# Patient Record
Sex: Female | Born: 1945
Health system: Southern US, Community
[De-identification: ages and names within clinical notes are randomized; demographics above are authoritative.]

## PROBLEM LIST (undated history)

## (undated) DIAGNOSIS — D500 Iron deficiency anemia secondary to blood loss (chronic): Principal | ICD-10-CM

## (undated) DIAGNOSIS — E119 Type 2 diabetes mellitus without complications: Secondary | ICD-10-CM

## (undated) DIAGNOSIS — E7800 Pure hypercholesterolemia, unspecified: Secondary | ICD-10-CM

## (undated) DIAGNOSIS — I10 Essential (primary) hypertension: Secondary | ICD-10-CM

## (undated) HISTORY — DX: Pure hypercholesterolemia, unspecified: E78.00

## (undated) HISTORY — PX: TUBAL LIGATION: SHX77

## (undated) HISTORY — DX: Iron deficiency anemia secondary to blood loss (chronic): D50.0

## (undated) HISTORY — PX: BUNIONECTOMY: SHX129

---

## 1998-09-19 ENCOUNTER — Emergency Department (HOSPITAL_COMMUNITY): Admission: EM | Admit: 1998-09-19 | Discharge: 1998-09-19 | Payer: Self-pay | Admitting: Emergency Medicine

## 1998-10-20 ENCOUNTER — Other Ambulatory Visit: Admission: RE | Admit: 1998-10-20 | Discharge: 1998-10-20 | Payer: Self-pay | Admitting: Obstetrics and Gynecology

## 1999-11-12 ENCOUNTER — Other Ambulatory Visit: Admission: RE | Admit: 1999-11-12 | Discharge: 1999-11-12 | Payer: Self-pay | Admitting: Obstetrics and Gynecology

## 2001-01-30 ENCOUNTER — Other Ambulatory Visit: Admission: RE | Admit: 2001-01-30 | Discharge: 2001-01-30 | Payer: Self-pay | Admitting: Obstetrics and Gynecology

## 2002-02-28 ENCOUNTER — Other Ambulatory Visit: Admission: RE | Admit: 2002-02-28 | Discharge: 2002-02-28 | Payer: Self-pay | Admitting: Obstetrics and Gynecology

## 2003-04-16 ENCOUNTER — Other Ambulatory Visit: Admission: RE | Admit: 2003-04-16 | Discharge: 2003-04-16 | Payer: Self-pay | Admitting: Obstetrics and Gynecology

## 2004-03-16 ENCOUNTER — Ambulatory Visit (HOSPITAL_COMMUNITY): Admission: RE | Admit: 2004-03-16 | Discharge: 2004-03-16 | Payer: Self-pay | Admitting: Pulmonary Disease

## 2004-06-08 ENCOUNTER — Other Ambulatory Visit: Admission: RE | Admit: 2004-06-08 | Discharge: 2004-06-08 | Payer: Self-pay | Admitting: Obstetrics and Gynecology

## 2005-10-07 ENCOUNTER — Other Ambulatory Visit: Admission: RE | Admit: 2005-10-07 | Discharge: 2005-10-07 | Payer: Self-pay | Admitting: Obstetrics and Gynecology

## 2005-11-04 ENCOUNTER — Ambulatory Visit (HOSPITAL_COMMUNITY): Admission: RE | Admit: 2005-11-04 | Discharge: 2005-11-04 | Payer: Self-pay | Admitting: Pulmonary Disease

## 2006-02-05 ENCOUNTER — Ambulatory Visit (HOSPITAL_COMMUNITY): Admission: RE | Admit: 2006-02-05 | Discharge: 2006-02-05 | Payer: Self-pay | Admitting: Pulmonary Disease

## 2006-03-10 ENCOUNTER — Ambulatory Visit (HOSPITAL_COMMUNITY): Admission: RE | Admit: 2006-03-10 | Discharge: 2006-03-10 | Payer: Self-pay | Admitting: Neurosurgery

## 2010-04-01 ENCOUNTER — Ambulatory Visit (HOSPITAL_COMMUNITY)
Admission: RE | Admit: 2010-04-01 | Discharge: 2010-04-01 | Payer: Self-pay | Source: Home / Self Care | Attending: Pulmonary Disease | Admitting: Pulmonary Disease

## 2010-04-10 ENCOUNTER — Encounter: Payer: Self-pay | Admitting: Pulmonary Disease

## 2011-08-24 ENCOUNTER — Ambulatory Visit (HOSPITAL_COMMUNITY)
Admission: RE | Admit: 2011-08-24 | Discharge: 2011-08-24 | Disposition: A | Discharge status: Home / Self Care | Payer: BC Managed Care – PPO | Source: Ambulatory Visit | Attending: Pulmonary Disease | Admitting: Pulmonary Disease

## 2011-08-24 ENCOUNTER — Other Ambulatory Visit (HOSPITAL_COMMUNITY): Payer: Self-pay | Admitting: Pulmonary Disease

## 2011-08-24 DIAGNOSIS — M2550 Pain in unspecified joint: Secondary | ICD-10-CM

## 2011-08-24 DIAGNOSIS — M25519 Pain in unspecified shoulder: Secondary | ICD-10-CM | POA: Insufficient documentation

## 2012-10-01 ENCOUNTER — Other Ambulatory Visit (HOSPITAL_COMMUNITY): Payer: Self-pay | Admitting: Pulmonary Disease

## 2012-10-01 DIAGNOSIS — R1013 Epigastric pain: Secondary | ICD-10-CM

## 2012-10-04 ENCOUNTER — Ambulatory Visit (HOSPITAL_COMMUNITY)
Admission: RE | Admit: 2012-10-04 | Discharge: 2012-10-04 | Disposition: A | Discharge status: Home / Self Care | Payer: Medicare Other | Source: Ambulatory Visit | Attending: Pulmonary Disease | Admitting: Pulmonary Disease

## 2012-10-04 ENCOUNTER — Other Ambulatory Visit (HOSPITAL_COMMUNITY): Payer: Self-pay | Admitting: Pulmonary Disease

## 2012-10-04 DIAGNOSIS — K219 Gastro-esophageal reflux disease without esophagitis: Secondary | ICD-10-CM | POA: Insufficient documentation

## 2012-10-04 DIAGNOSIS — R1013 Epigastric pain: Secondary | ICD-10-CM | POA: Insufficient documentation

## 2012-10-23 ENCOUNTER — Other Ambulatory Visit (HOSPITAL_COMMUNITY): Payer: Self-pay | Admitting: Pulmonary Disease

## 2012-10-23 DIAGNOSIS — R109 Unspecified abdominal pain: Secondary | ICD-10-CM

## 2012-10-25 ENCOUNTER — Ambulatory Visit (HOSPITAL_COMMUNITY)
Admission: RE | Admit: 2012-10-25 | Discharge: 2012-10-25 | Disposition: A | Discharge status: Home / Self Care | Payer: Medicare Other | Source: Ambulatory Visit | Attending: Pulmonary Disease | Admitting: Pulmonary Disease

## 2012-10-25 DIAGNOSIS — R109 Unspecified abdominal pain: Secondary | ICD-10-CM

## 2014-07-03 ENCOUNTER — Other Ambulatory Visit (HOSPITAL_COMMUNITY): Payer: Self-pay | Admitting: Pulmonary Disease

## 2014-07-03 DIAGNOSIS — R1011 Right upper quadrant pain: Secondary | ICD-10-CM

## 2014-07-16 ENCOUNTER — Ambulatory Visit (HOSPITAL_COMMUNITY): Payer: Medicare Other

## 2014-07-18 ENCOUNTER — Encounter (HOSPITAL_COMMUNITY): Payer: Self-pay

## 2014-07-18 ENCOUNTER — Ambulatory Visit (HOSPITAL_COMMUNITY)
Admission: RE | Admit: 2014-07-18 | Discharge: 2014-07-18 | Disposition: A | Discharge status: Home / Self Care | Payer: Medicare Other | Source: Ambulatory Visit | Attending: Pulmonary Disease | Admitting: Pulmonary Disease

## 2014-07-18 DIAGNOSIS — R1011 Right upper quadrant pain: Secondary | ICD-10-CM | POA: Diagnosis not present

## 2014-07-18 HISTORY — DX: Essential (primary) hypertension: I10

## 2014-07-18 MED ORDER — IOHEXOL 300 MG/ML  SOLN
100.0000 mL | Freq: Once | INTRAMUSCULAR | Status: AC | PRN
  Administered 2014-07-18: 100 mL via INTRAVENOUS

## 2014-09-23 ENCOUNTER — Other Ambulatory Visit: Payer: Self-pay | Admitting: Obstetrics and Gynecology

## 2014-09-23 DIAGNOSIS — R928 Other abnormal and inconclusive findings on diagnostic imaging of breast: Secondary | ICD-10-CM

## 2014-10-09 ENCOUNTER — Ambulatory Visit
Admission: RE | Admit: 2014-10-09 | Discharge: 2014-10-09 | Disposition: A | Discharge status: Home / Self Care | Payer: Medicare Other | Source: Ambulatory Visit | Attending: Obstetrics and Gynecology | Admitting: Obstetrics and Gynecology

## 2014-10-09 DIAGNOSIS — R928 Other abnormal and inconclusive findings on diagnostic imaging of breast: Secondary | ICD-10-CM

## 2015-01-01 ENCOUNTER — Other Ambulatory Visit (HOSPITAL_COMMUNITY): Payer: Self-pay | Admitting: Pulmonary Disease

## 2015-01-01 DIAGNOSIS — R6889 Other general symptoms and signs: Secondary | ICD-10-CM

## 2015-01-01 DIAGNOSIS — R209 Unspecified disturbances of skin sensation: Secondary | ICD-10-CM

## 2015-01-06 ENCOUNTER — Ambulatory Visit (HOSPITAL_COMMUNITY): Payer: Medicare Other

## 2015-01-08 ENCOUNTER — Ambulatory Visit (HOSPITAL_COMMUNITY)
Admission: RE | Admit: 2015-01-08 | Discharge: 2015-01-08 | Disposition: A | Discharge status: Home / Self Care | Payer: Medicare Other | Source: Ambulatory Visit | Attending: Pulmonary Disease | Admitting: Pulmonary Disease

## 2015-01-08 DIAGNOSIS — R209 Unspecified disturbances of skin sensation: Secondary | ICD-10-CM

## 2015-01-08 DIAGNOSIS — R6889 Other general symptoms and signs: Secondary | ICD-10-CM | POA: Diagnosis not present

## 2015-01-08 NOTE — Progress Notes (Signed)
VASCULAR LAB PRELIMINARY  PRELIMINARY  PRELIMINARY  PRELIMINARY  VASCULAR LAB PRELIMINARY  ARTERIAL  ABI completed: Normal ABI at rest.     RIGHT    LEFT    PRESSURE WAVEFORM  PRESSURE WAVEFORM  BRACHIAL 133 Normal BRACHIAL 134 Normal  DP 136 Triphasic DP 134 Triphasic  PT 133 Triphasic PT 126 Biphasic  GREAT TOE  NA GREAT TOE  NA    RIGHT LEFT  ABI 1.0 1.0     Rachael Davenport, Bonnye Fava, RVT 01/08/2015, 10:44 AM    Rachael Davenport, Bonnye Fava, RVT 01/08/2015, 10:43 AM

## 2015-03-20 ENCOUNTER — Other Ambulatory Visit: Payer: Self-pay | Admitting: Obstetrics and Gynecology

## 2015-03-20 DIAGNOSIS — R921 Mammographic calcification found on diagnostic imaging of breast: Secondary | ICD-10-CM

## 2015-04-14 ENCOUNTER — Ambulatory Visit
Admission: RE | Admit: 2015-04-14 | Discharge: 2015-04-14 | Disposition: A | Discharge status: Home / Self Care | Payer: Medicare Other | Source: Ambulatory Visit | Attending: Obstetrics and Gynecology | Admitting: Obstetrics and Gynecology

## 2015-04-14 DIAGNOSIS — R921 Mammographic calcification found on diagnostic imaging of breast: Secondary | ICD-10-CM

## 2015-10-05 ENCOUNTER — Other Ambulatory Visit: Payer: Self-pay | Admitting: Obstetrics and Gynecology

## 2015-10-05 DIAGNOSIS — R921 Mammographic calcification found on diagnostic imaging of breast: Secondary | ICD-10-CM

## 2015-10-08 ENCOUNTER — Other Ambulatory Visit: Payer: Self-pay | Admitting: Obstetrics and Gynecology

## 2015-10-08 DIAGNOSIS — M810 Age-related osteoporosis without current pathological fracture: Secondary | ICD-10-CM

## 2015-10-09 ENCOUNTER — Ambulatory Visit
Admission: RE | Admit: 2015-10-09 | Discharge: 2015-10-09 | Disposition: A | Discharge status: Home / Self Care | Payer: Medicare Other | Source: Ambulatory Visit | Attending: Obstetrics and Gynecology | Admitting: Obstetrics and Gynecology

## 2015-10-09 DIAGNOSIS — R921 Mammographic calcification found on diagnostic imaging of breast: Secondary | ICD-10-CM

## 2015-10-16 ENCOUNTER — Ambulatory Visit
Admission: RE | Admit: 2015-10-16 | Discharge: 2015-10-16 | Disposition: A | Discharge status: Home / Self Care | Payer: Medicare Other | Source: Ambulatory Visit | Attending: Obstetrics and Gynecology | Admitting: Obstetrics and Gynecology

## 2015-10-16 DIAGNOSIS — M810 Age-related osteoporosis without current pathological fracture: Secondary | ICD-10-CM

## 2016-06-23 ENCOUNTER — Ambulatory Visit (HOSPITAL_COMMUNITY)
Admission: RE | Admit: 2016-06-23 | Discharge: 2016-06-23 | Disposition: A | Discharge status: Home / Self Care | Payer: Medicare Other | Source: Ambulatory Visit | Attending: Vascular Surgery | Admitting: Vascular Surgery

## 2016-06-23 ENCOUNTER — Other Ambulatory Visit (HOSPITAL_COMMUNITY): Payer: Self-pay | Admitting: Pulmonary Disease

## 2016-06-23 DIAGNOSIS — R52 Pain, unspecified: Secondary | ICD-10-CM | POA: Diagnosis not present

## 2016-06-23 DIAGNOSIS — E785 Hyperlipidemia, unspecified: Secondary | ICD-10-CM | POA: Diagnosis not present

## 2016-06-23 DIAGNOSIS — M79604 Pain in right leg: Secondary | ICD-10-CM | POA: Insufficient documentation

## 2016-06-23 DIAGNOSIS — I10 Essential (primary) hypertension: Secondary | ICD-10-CM | POA: Diagnosis not present

## 2016-06-23 DIAGNOSIS — M79605 Pain in left leg: Secondary | ICD-10-CM | POA: Insufficient documentation

## 2016-08-06 ENCOUNTER — Encounter (HOSPITAL_COMMUNITY): Payer: Self-pay | Admitting: Emergency Medicine

## 2016-08-06 ENCOUNTER — Emergency Department (HOSPITAL_COMMUNITY)
Admission: EM | Admit: 2016-08-06 | Discharge: 2016-08-06 | Disposition: A | Discharge status: Home / Self Care | Payer: Medicare Other | Attending: Emergency Medicine | Admitting: Emergency Medicine

## 2016-08-06 DIAGNOSIS — I10 Essential (primary) hypertension: Secondary | ICD-10-CM | POA: Diagnosis not present

## 2016-08-06 DIAGNOSIS — M7989 Other specified soft tissue disorders: Secondary | ICD-10-CM | POA: Insufficient documentation

## 2016-08-06 DIAGNOSIS — E119 Type 2 diabetes mellitus without complications: Secondary | ICD-10-CM | POA: Diagnosis not present

## 2016-08-06 DIAGNOSIS — Z7982 Long term (current) use of aspirin: Secondary | ICD-10-CM | POA: Diagnosis not present

## 2016-08-06 DIAGNOSIS — M79661 Pain in right lower leg: Secondary | ICD-10-CM

## 2016-08-06 DIAGNOSIS — Z79899 Other long term (current) drug therapy: Secondary | ICD-10-CM | POA: Insufficient documentation

## 2016-08-06 DIAGNOSIS — M79671 Pain in right foot: Secondary | ICD-10-CM | POA: Diagnosis present

## 2016-08-06 HISTORY — DX: Type 2 diabetes mellitus without complications: E11.9

## 2016-08-06 LAB — I-STAT CHEM 8, ED
BUN: 13 mg/dL (ref 6–20)
CALCIUM ION: 1.15 mmol/L (ref 1.15–1.40)
CREATININE: 0.9 mg/dL (ref 0.44–1.00)
Chloride: 107 mmol/L (ref 101–111)
Glucose, Bld: 95 mg/dL (ref 65–99)
HEMATOCRIT: 29 % — AB (ref 36.0–46.0)
Hemoglobin: 9.9 g/dL — ABNORMAL LOW (ref 12.0–15.0)
Potassium: 4.5 mmol/L (ref 3.5–5.1)
Sodium: 139 mmol/L (ref 135–145)
TCO2: 24 mmol/L (ref 0–100)

## 2016-08-06 MED ORDER — ENOXAPARIN SODIUM 60 MG/0.6ML ~~LOC~~ SOLN
1.0000 mg/kg | Freq: Once | SUBCUTANEOUS | Status: AC
  Administered 2016-08-06: 55 mg via SUBCUTANEOUS
  Filled 2016-08-06: qty 0.6

## 2016-08-06 NOTE — ED Triage Notes (Signed)
Swollen rt foot and ankle  since Monday, states had [place] on her legs in  April to check for clots and it was clear. Denies any injury, foot has good pulse and neuro, can wiggle toes

## 2016-08-06 NOTE — ED Provider Notes (Signed)
[place] DEPT Provider Note   CSN: [id number] Arrival date & time: 08/06/16  1809  By signing my name below, I, Sonum Patel, attest that this documentation has been prepared under the direction and in the presence of Debroah Baller, NP. Electronically Signed: Ludger Nutting, Scribe. 08/06/16. 7:45 PM.  History   Chief Complaint Chief Complaint  Patient presents with  . Foot Pain    The history is provided by the patient. No language interpreter was used.     HPI Comments: Rachael Davenport is a 71 y.o. female who presents to the Emergency Department complaining of persistent right foot and ankle pain that has been ongoing for the past week. She denies known injury, trauma, or insect bites recently. She reports associated mild pain to the same area. She notes having an US of the area in April 2018 and was told the study was negative. She denies SOB, cough, wheezing, CP, nausea, vomiting, fever, chills.   Her medication list found via Care Everywhere: Crestor 10mg , Imitrex 25 mg tablet, vitamin D2 50,000 units capsule q week, Besibance 0.6% susp, Prolenesa 0.07% solution, Durezol 0.05% emul to left eye. Will have pharmacy update patient's chart.  Past Medical History:  Diagnosis Date  . Diabetes mellitus without complication ([place])   . Hypertension     There are no active problems to display for this patient.   History reviewed. No pertinent surgical history.  OB History    No data available       Home Medications    Prior to Admission medications   Medication Sig Start Date End Date Taking? Authorizing Provider  aspirin EC 81 MG tablet Take 81 mg by mouth daily.   Yes [provider]  cycloSPORINE (RESTASIS) 0.05 % ophthalmic emulsion Place 1 drop into both eyes 2 (two) times daily.   Yes [provider]  olmesartan (BENICAR) 20 MG tablet Take 20 mg by mouth daily. 05/12/15  Yes [provider]  SUMAtriptan (IMITREX) 25 MG tablet Take 25 mg by mouth  once as needed for migraine (MAY REPEAT ONCE IN 2 HOURS IF NO RELIEF (MAX 2 TABLETS/24 HOURS)).  06/08/15  Yes [provider]  Vitamin D, Ergocalciferol, (DRISDOL) 50000 units CAPS capsule Take 50,000 Units by mouth every 7 (seven) days. 05/12/15  Yes [provider]    Family History No family history on file.  Social History Social History  Substance Use Topics  . Smoking status: Never Smoker  . Smokeless tobacco: Never Used  . Alcohol use Not on file     Allergies   Diovan [valsartan] and Penicillins   Review of Systems Review of Systems  Constitutional: Negative for chills and fever.  Respiratory: Negative for cough, shortness of breath and wheezing.   Cardiovascular: Positive for leg swelling. Negative for chest pain.  Gastrointestinal: Negative for nausea and vomiting.  Musculoskeletal: Positive for arthralgias and joint swelling.  Skin: Negative for rash.  Neurological: Negative for syncope.     Physical Exam Updated Vital Signs BP 140/69 (BP Location: Right Arm)   Pulse 73   Temp 98.4 F (36.9 C)   Resp 16   Wt 124 lb 5 oz (56.4 kg)   SpO2 100%   Physical Exam  Constitutional: She is oriented to person, place, and time. She appears well-developed and well-nourished.  Pulmonary/Chest: Effort normal.  Abdominal: Soft.  Musculoskeletal: Normal range of motion. She exhibits edema.  Swelling to the right foot and ankle. Pedal pulses are 2+, adequate circulation.  Plantar flexion and dorsiflexion without difficulty or pain.   Neurological: She is alert and oriented to person, place, and time.  Skin: Skin is warm and dry.  No increased warmth   Nursing note and vitals reviewed.    ED Treatments / Results  DIAGNOSTIC STUDIES: Oxygen Saturation is 100% on RA, normal by my interpretation.    COORDINATION OF CARE: 7:37 PM Discussed treatment plan with pt at bedside and pt agreed to plan.   Labs (all labs ordered are listed, but only abnormal  results are displayed) Labs Reviewed  I-STAT CHEM 8, ED - Abnormal; Notable for the following:       Result Value   Hemoglobin 9.9 (*)    HCT 29.0 (*)    All other components within normal limits   Radiology No results found.  Procedures Procedures (including critical care time)  Medications Ordered in ED Medications  enoxaparin (LOVENOX) injection 55 mg (55 mg Subcutaneous Given 08/06/16 2224)     Initial Impression / Assessment and Plan / ED Course  I have reviewed the triage vital signs and the nursing notes.  Pertinent lab results that were available during my care of the patient were reviewed by me and considered in my medical decision making (see chart for details).   Final Clinical Impressions(s) / ED Diagnoses  71 y.o. female with right lower extremity swelling stable for d/c to return in the morning for ultrasound study for possible DVT. Patient given Lovenox tonight.   Final diagnoses:  Foot pain, right  Pain and swelling of right lower leg    New Prescriptions Discharge Medication List as of 08/06/2016 10:11 PM     I personally performed the services described in this documentation, which was scribed in my presence. The recorded information has been reviewed and is accurate.    Debroah Baller [place], [place] 08/06/16 Arnoldo Lenis    Charlesetta Shanks, MD 08/08/16 [phone]

## 2016-08-06 NOTE — Discharge Instructions (Signed)
Return in the morning for the ultrasound to make sure you do not have a blood clot.

## 2016-08-07 ENCOUNTER — Ambulatory Visit (HOSPITAL_COMMUNITY)
Admission: RE | Admit: 2016-08-07 | Discharge: 2016-08-07 | Disposition: A | Discharge status: Home / Self Care | Payer: Medicare Other | Source: Ambulatory Visit | Attending: Emergency Medicine | Admitting: Emergency Medicine

## 2016-08-07 DIAGNOSIS — M79609 Pain in unspecified limb: Secondary | ICD-10-CM | POA: Diagnosis not present

## 2016-08-07 DIAGNOSIS — M7989 Other specified soft tissue disorders: Secondary | ICD-10-CM | POA: Diagnosis not present

## 2016-08-07 NOTE — Progress Notes (Signed)
VASCULAR LAB PRELIMINARY  PRELIMINARY  PRELIMINARY  PRELIMINARY  Right lower extremity venous duplex completed.    Preliminary report:  There is no DVT or SVT noted in the right lower extremity.   Zakaria Fromer, RVT 08/07/2016, 8:46 AM

## 2016-09-08 ENCOUNTER — Other Ambulatory Visit: Payer: Self-pay | Admitting: Obstetrics and Gynecology

## 2016-09-08 DIAGNOSIS — R921 Mammographic calcification found on diagnostic imaging of breast: Secondary | ICD-10-CM

## 2016-09-15 ENCOUNTER — Other Ambulatory Visit: Payer: Self-pay | Admitting: Family

## 2016-09-15 ENCOUNTER — Other Ambulatory Visit (HOSPITAL_BASED_OUTPATIENT_CLINIC_OR_DEPARTMENT_OTHER): Payer: Medicare Other

## 2016-09-15 ENCOUNTER — Ambulatory Visit (HOSPITAL_BASED_OUTPATIENT_CLINIC_OR_DEPARTMENT_OTHER): Payer: Medicare Other | Admitting: Family

## 2016-09-15 ENCOUNTER — Ambulatory Visit: Payer: Medicare Other

## 2016-09-15 VITALS — BP 117/56 | HR 65 | Temp 98.0°F | Resp 17 | Ht 62.0 in | Wt 123.0 lb

## 2016-09-15 DIAGNOSIS — D631 Anemia in chronic kidney disease: Secondary | ICD-10-CM

## 2016-09-15 DIAGNOSIS — N189 Chronic kidney disease, unspecified: Secondary | ICD-10-CM

## 2016-09-15 DIAGNOSIS — D510 Vitamin B12 deficiency anemia due to intrinsic factor deficiency: Secondary | ICD-10-CM

## 2016-09-15 DIAGNOSIS — D508 Other iron deficiency anemias: Secondary | ICD-10-CM

## 2016-09-15 DIAGNOSIS — D649 Anemia, unspecified: Secondary | ICD-10-CM

## 2016-09-15 LAB — COMPREHENSIVE METABOLIC PANEL
ALT: 13 U/L (ref 0–55)
ANION GAP: 7 meq/L (ref 3–11)
AST: 21 U/L (ref 5–34)
Albumin: 4.1 g/dL (ref 3.5–5.0)
Alkaline Phosphatase: 70 U/L (ref 40–150)
BUN: 25 mg/dL (ref 7.0–26.0)
CALCIUM: 9.9 mg/dL (ref 8.4–10.4)
CHLORIDE: 105 meq/L (ref 98–109)
CO2: 26 meq/L (ref 22–29)
CREATININE: 1 mg/dL (ref 0.6–1.1)
EGFR: 67 mL/min/{1.73_m2} — AB (ref >90)
Glucose: 94 mg/dl (ref 70–140)
POTASSIUM: 4.2 meq/L (ref 3.5–5.1)
Sodium: 139 mEq/L (ref 136–145)
Total Bilirubin: 0.84 mg/dL (ref 0.20–1.20)
Total Protein: 7.6 g/dL (ref 6.4–8.3)

## 2016-09-15 LAB — IRON AND TIBC
%SAT: 26 % (ref 21–57)
IRON: 71 ug/dL (ref 41–142)
TIBC: 273 ug/dL (ref 236–444)
UIBC: 202 ug/dL (ref 120–384)

## 2016-09-15 LAB — CBC WITH DIFFERENTIAL (CANCER CENTER ONLY)
BASO#: 0 10*3/uL (ref 0.0–0.2)
BASO%: 1.2 % (ref 0.0–2.0)
EOS%: 4.4 % (ref 0.0–7.0)
Eosinophils Absolute: 0.2 10*3/uL (ref 0.0–0.5)
HCT: 32.1 % — ABNORMAL LOW (ref 34.8–46.6)
HGB: 10.3 g/dL — ABNORMAL LOW (ref 11.6–15.9)
LYMPH#: 1.4 10*3/uL (ref 0.9–3.3)
LYMPH%: 39.7 % (ref 14.0–48.0)
MCH: 31.6 pg (ref 26.0–34.0)
MCHC: 32.1 g/dL (ref 32.0–36.0)
MCV: 99 fL (ref 81–101)
MONO#: 0.4 10*3/uL (ref 0.1–0.9)
MONO%: 10.5 % (ref 0.0–13.0)
NEUT#: 1.5 10*3/uL (ref 1.5–6.5)
NEUT%: 44.2 % (ref 39.6–80.0)
PLATELETS: 198 10*3/uL (ref 145–400)
RBC: 3.26 10*6/uL — ABNORMAL LOW (ref 3.70–5.32)
RDW: 12.3 % (ref 11.1–15.7)
WBC: 3.4 10*3/uL — ABNORMAL LOW (ref 3.9–10.0)

## 2016-09-15 LAB — FERRITIN: Ferritin: 246 ng/ml (ref 9–269)

## 2016-09-15 LAB — LACTATE DEHYDROGENASE: LDH: 207 U/L (ref 125–245)

## 2016-09-15 LAB — CHCC SATELLITE - SMEAR

## 2016-09-15 NOTE — Progress Notes (Signed)
Hematology/Oncology Consultation   Name: Rachael Davenport      MRN: [id number]    Location: Room/bed info not found  Date: 09/15/2016 Time:10:23 AM   REFERRING PHYSICIAN: Vincente Liberty, MD  REASON FOR CONSULT: Anemia    DIAGNOSIS: Erythropoietin deficiency anemia   HISTORY OF PRESENT ILLNESS: Rachael Davenport is a very pleasant 71 yo African American female with recent history of anemia. She has had some fatigue and occasional chills.  Hgb is a little improved today at 10.3 with an MCV of 99. Iron studies and epo level are pending.  No family history of anemia that she knows of. No sickle cell disease or trait.   No personal or familial history of cancer that she is aware of.  She has had no problem with frequent infections. No fever, chills, n/v, cough, rash, dizziness, SOB, chest pain, palpitations, abdominal pain or changes in bowel or bladder habits.  No episodes or bleeding, bruising or petechiae.  She states that she is up to date with her colonoscopy and that her last one was negative. Her mammogram is scheduled for 10/10/2016.  She has mild swelling in her right ankle that comes and goes. She states that she sees a vascular specialist. No tenderness in her extremities. She has numbness and tingling in her right hand that is constant. She denies having any back issues.  She has maintained a good appetite and is staying well hydrated. She states that her weight is stable.  She has never been a smoker and does not drink alcoholic beverages.  She takes one baby aspirin daily.  She has 3 grown children and no history of miscarriage.  Her only surgery has been cataracts removed from both eyes.   She retired from [employer] after 30+ years in 2013. 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   ROS: All other 10 point review of systems is negative.   PAST MEDICAL HISTORY:   Past  Medical History:  Diagnosis Date  . Diabetes mellitus without complication ([place])   . Hypertension     ALLERGIES: Allergies  Allergen Reactions  . Diovan [Valsartan] Other (See Comments)    Heart sped up  . Penicillins Itching and Swelling    Has patient had a PCN reaction causing immediate rash, facial/tongue/throat swelling, SOB or lightheadedness with hypotension: Yes Has patient had a PCN reaction causing severe rash involving mucus membranes or skin necrosis: No Has patient had a PCN reaction that required hospitalization: No Has patient had a PCN reaction occurring within the last 10 years: No If all of the above answers are "NO", then may proceed with Cephalosporin use.       MEDICATIONS:  Current Outpatient Prescriptions on File Prior to Visit  Medication Sig Dispense Refill  . aspirin EC 81 MG tablet Take 81 mg by mouth daily.    Marland Kitchen olmesartan (BENICAR) 20 MG tablet Take 20 mg by mouth daily.    . SUMAtriptan (IMITREX) 25 MG tablet Take 25 mg by mouth once as needed for migraine (MAY REPEAT ONCE IN 2 HOURS IF NO RELIEF (MAX 2 TABLETS/24 HOURS)).     . Vitamin D, Ergocalciferol, (DRISDOL) 50000 units CAPS capsule Take 50,000 Units by mouth every 7 (seven) days.     No current facility-administered medications on file prior to visit.      PAST SURGICAL HISTORY No past surgical history on file.  FAMILY  HISTORY: No family history on file.  SOCIAL HISTORY:  reports that she has never smoked. She has never used smokeless tobacco. Her alcohol and drug histories are not on file.  PERFORMANCE STATUS: The patient's performance status is 1 - Symptomatic but completely ambulatory  PHYSICAL EXAM: Most Recent Vital Signs: Blood pressure (!) 117/56, pulse 65, temperature 98 F (36.7 C), temperature source Oral, resp. rate 17, height 5\' 2"  (1.575 m), weight 123 lb (55.8 kg), SpO2 100 %. BP (!) 117/56 (BP Location: Right Arm, Patient Position: Sitting)   Pulse 65   Temp 98 F  (36.7 C) (Oral)   Resp 17   Ht 5\' 2"  (1.575 m)   Wt 123 lb (55.8 kg)   SpO2 100%   BMI 22.50 kg/m   General Appearance:    Alert, cooperative, no distress, appears stated age  Head:    Normocephalic, without obvious abnormality, atraumatic  Eyes:    PERRL, conjunctiva/corneas clear, EOM's intact, fundi    benign, both eyes        Throat:   Lips, mucosa, and tongue normal; teeth and gums normal  Neck:   Supple, symmetrical, trachea midline, no adenopathy;    thyroid:  no enlargement/tenderness/nodules; no carotid   bruit or JVD  Back:     Symmetric, no curvature, ROM normal, no CVA tenderness  Lungs:     Clear to auscultation bilaterally, respirations unlabored  Chest Wall:    No tenderness or deformity   Heart:    Regular rate and rhythm, S1 and S2 normal, no murmur, rub   or gallop     Abdomen:     Soft, non-tender, bowel sounds active all four quadrants,    no masses, no organomegaly        Extremities:   Extremities normal, atraumatic, no cyanosis or edema  Pulses:   2+ and symmetric all extremities  Skin:   Skin color, texture, turgor normal, no rashes or lesions  Lymph nodes:   Cervical, supraclavicular, and axillary nodes normal  Neurologic:   CNII-XII intact, normal strength, sensation and reflexes    throughout    LABORATORY DATA:  Results for orders placed or performed in visit on 09/15/16 (from the past 48 hour(s))  CBC w/Diff     Status: Abnormal   Collection Time: 09/15/16  9:50 AM  Result Value Ref Range   WBC 3.4 (L) 3.9 - 10.0 10e3/uL   RBC 3.26 (L) 3.70 - 5.32 10e6/uL   HGB 10.3 (L) 11.6 - 15.9 g/dL   HCT 32.1 (L) 34.8 - 46.6 %   MCV 99 81 - 101 fL   MCH 31.6 26.0 - 34.0 pg   MCHC 32.1 32.0 - 36.0 g/dL   RDW 12.3 11.1 - 15.7 %   Platelets 198 145 - 400 10e3/uL   NEUT# 1.5 1.5 - 6.5 10e3/uL   LYMPH# 1.4 0.9 - 3.3 10e3/uL   MONO# 0.4 0.1 - 0.9 10e3/uL   Eosinophils Absolute 0.2 0.0 - 0.5 10e3/uL   BASO# 0.0 0.0 - 0.2 10e3/uL   NEUT% 44.2 39.6 -  80.0 %   LYMPH% 39.7 14.0 - 48.0 %   MONO% 10.5 0.0 - 13.0 %   EOS% 4.4 0.0 - 7.0 %   BASO% 1.2 0.0 - 2.0 %  Smear     Status: None   Collection Time: 09/15/16  9:50 AM  Result Value Ref Range   Smear Result Smear Available       RADIOGRAPHY: No results found.  PATHOLOGY: None  ASSESSMENT/PLAN: Ms. Kirchman is a very pleasant 71 yo [place] American female with recent history of anemia. Her erythropoietin level is low. Iron studies and B 12 level look ok.  Hgb is 10.3 with an MCV of 99. We will look at treating her with an ESA, Aranesp as indicated.  We will continue to follow along with her and plan to see her back in another 2 months for repeat lab work and follow-up.  All questions for today were answered. She will contact our office with any questions or concerns. We can certainly see her much sooner if necessary.  She was discussed with and also seen by Dr. Marin Olp and he is in agreement with the aforementioned.   [facility]   Addendum:  I agree with the above assessment by Judson Roch. I saw and examined patient with Judson Roch. 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 Her erythropoietin level is only 8.5. She will never get her hemoglobin up to where it should be "normal" because of this.  Her reticulocyte count is less than 1.0. Again discussed to show how her bone marrow is just "weak" because of the low erythropoietin level.  She clearly needs Aranesp. We will go ahead and try to get this started. This will be able to help her get her hemoglobin higher. This will make her feel better.  I looked at her blood under the microscope. I do not see anything that looked suspicious.  Her iron studies look okay. Her ferritin is 246 with an iron saturation of 26%.  We spent about 40 minutes with her. We answered all of her questions. We showed her the labs and review the labs with her.  She felt much better after talking to [place]. We gave her a prayer blanket. She is very thankful  for this.  Lattie Haw, MD

## 2016-09-16 LAB — ERYTHROPOIETIN: ERYTHROPOIETIN: 8.5 m[IU]/mL (ref 2.6–18.5)

## 2016-09-16 LAB — VITAMIN B12: Vitamin B12: 370 pg/mL (ref 232–1245)

## 2016-09-16 LAB — RETICULOCYTES: RETICULOCYTE COUNT: 1 % (ref 0.6–2.6)

## 2016-09-18 DIAGNOSIS — D631 Anemia in chronic kidney disease: Secondary | ICD-10-CM | POA: Insufficient documentation

## 2016-10-14 ENCOUNTER — Ambulatory Visit
Admission: RE | Admit: 2016-10-14 | Discharge: 2016-10-14 | Disposition: A | Discharge status: Home / Self Care | Payer: Medicare Other | Source: Ambulatory Visit | Attending: Obstetrics and Gynecology | Admitting: Obstetrics and Gynecology

## 2016-10-14 DIAGNOSIS — R921 Mammographic calcification found on diagnostic imaging of breast: Secondary | ICD-10-CM

## 2016-12-23 ENCOUNTER — Ambulatory Visit (HOSPITAL_BASED_OUTPATIENT_CLINIC_OR_DEPARTMENT_OTHER): Payer: Medicare Other | Admitting: Hematology & Oncology

## 2016-12-23 ENCOUNTER — Other Ambulatory Visit (HOSPITAL_BASED_OUTPATIENT_CLINIC_OR_DEPARTMENT_OTHER): Payer: Medicare Other

## 2016-12-23 ENCOUNTER — Encounter: Payer: Self-pay | Admitting: Hematology & Oncology

## 2016-12-23 VITALS — BP 129/59 | HR 62 | Temp 98.0°F | Resp 17 | Wt 128.0 lb

## 2016-12-23 DIAGNOSIS — E119 Type 2 diabetes mellitus without complications: Secondary | ICD-10-CM | POA: Diagnosis not present

## 2016-12-23 DIAGNOSIS — D631 Anemia in chronic kidney disease: Secondary | ICD-10-CM

## 2016-12-23 DIAGNOSIS — D649 Anemia, unspecified: Secondary | ICD-10-CM

## 2016-12-23 DIAGNOSIS — I10 Essential (primary) hypertension: Secondary | ICD-10-CM | POA: Diagnosis not present

## 2016-12-23 DIAGNOSIS — N189 Chronic kidney disease, unspecified: Secondary | ICD-10-CM | POA: Diagnosis not present

## 2016-12-23 DIAGNOSIS — K5900 Constipation, unspecified: Secondary | ICD-10-CM

## 2016-12-23 DIAGNOSIS — D508 Other iron deficiency anemias: Secondary | ICD-10-CM

## 2016-12-23 DIAGNOSIS — D510 Vitamin B12 deficiency anemia due to intrinsic factor deficiency: Secondary | ICD-10-CM

## 2016-12-23 LAB — COMPREHENSIVE METABOLIC PANEL
ALT: 16 U/L (ref 0–55)
AST: 26 U/L (ref 5–34)
Albumin: 4 g/dL (ref 3.5–5.0)
Alkaline Phosphatase: 69 U/L (ref 40–150)
Anion Gap: 9 mEq/L (ref 3–11)
BUN: 15 mg/dL (ref 7.0–26.0)
CALCIUM: 9.5 mg/dL (ref 8.4–10.4)
CHLORIDE: 108 meq/L (ref 98–109)
CO2: 25 mEq/L (ref 22–29)
Creatinine: 0.9 mg/dL (ref 0.6–1.1)
EGFR: 77 mL/min/{1.73_m2} — ABNORMAL LOW (ref >90)
Glucose: 85 mg/dl (ref 70–140)
POTASSIUM: 3.9 meq/L (ref 3.5–5.1)
Sodium: 142 mEq/L (ref 136–145)
TOTAL PROTEIN: 7.5 g/dL (ref 6.4–8.3)
Total Bilirubin: 0.96 mg/dL (ref 0.20–1.20)

## 2016-12-23 LAB — IRON AND TIBC
%SAT: 27 % (ref 21–57)
Iron: 74 ug/dL (ref 41–142)
TIBC: 273 ug/dL (ref 236–444)
UIBC: 199 ug/dL (ref 120–384)

## 2016-12-23 LAB — CBC WITH DIFFERENTIAL (CANCER CENTER ONLY)
BASO#: 0 10*3/uL (ref 0.0–0.2)
BASO%: 0.8 % (ref 0.0–2.0)
EOS%: 2.7 % (ref 0.0–7.0)
Eosinophils Absolute: 0.1 10*3/uL (ref 0.0–0.5)
HEMATOCRIT: 32.3 % — AB (ref 34.8–46.6)
HGB: 10.5 g/dL — ABNORMAL LOW (ref 11.6–15.9)
LYMPH#: 1.2 10*3/uL (ref 0.9–3.3)
LYMPH%: 34 % (ref 14.0–48.0)
MCH: 31.7 pg (ref 26.0–34.0)
MCHC: 32.5 g/dL (ref 32.0–36.0)
MCV: 98 fL (ref 81–101)
MONO#: 0.4 10*3/uL (ref 0.1–0.9)
MONO%: 9.6 % (ref 0.0–13.0)
NEUT#: 1.9 10*3/uL (ref 1.5–6.5)
NEUT%: 52.9 % (ref 39.6–80.0)
PLATELETS: 196 10*3/uL (ref 145–400)
RBC: 3.31 10*6/uL — AB (ref 3.70–5.32)
RDW: 12.1 % (ref 11.1–15.7)
WBC: 3.7 10*3/uL — AB (ref 3.9–10.0)

## 2016-12-23 LAB — LACTATE DEHYDROGENASE: LDH: 234 U/L (ref 125–245)

## 2016-12-23 LAB — FERRITIN: FERRITIN: 215 ng/mL (ref 9–269)

## 2016-12-23 NOTE — Progress Notes (Signed)
Hematology and Oncology Follow Up Visit  Rachael Davenport [id number] [birth year] 71 y.o. 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 This is her second office visit. We saw her back in June. At that time, it was apparent that her erythropoietin level was quite low. Her erythropoietin level was only 8.5.  We checked her iron studies. Her ferritin was 246 with an iron saturation of 26%.  I talked to her about Aranesp. I talked to her as to why Aranesp would work. I explained why she would benefit from Aranesp.  She would like to give Aranesp a try.  She is constipated. She's tried different remedies for her constipation.  She's had no obvious bleeding. She's had no fever. She's had no cough or shortness of breath.  Overall, her performance status is ECOG 1.  Medications:  Current Outpatient Prescriptions:  .  aspirin EC 81 MG tablet, Take 81 mg by mouth daily., Disp: , Rfl:  .  olmesartan (BENICAR) 20 MG tablet, Take 20 mg by mouth daily., Disp: , Rfl:  .  SUMAtriptan (IMITREX) 25 MG tablet, Take 25 mg by mouth once as needed for migraine (MAY REPEAT ONCE IN 2 HOURS IF NO RELIEF (MAX 2 TABLETS/24 HOURS)). , Disp: , Rfl:  .  Vitamin D, Ergocalciferol, (DRISDOL) 50000 units CAPS capsule, Take 50,000 Units by mouth every 7 (seven) days., Disp: , Rfl:   Allergies:  Allergies  Allergen Reactions  . Diovan [Valsartan] Other (See Comments)    Heart sped up  . Penicillins Itching and Swelling    Has patient had a PCN reaction causing immediate rash, facial/tongue/throat swelling, SOB or lightheadedness with hypotension: Yes Has patient had a PCN reaction causing severe rash involving mucus membranes or skin necrosis: No Has patient had a PCN reaction  that required hospitalization: No Has patient had a PCN reaction occurring within the last 10 years: No If all of the above answers are "NO", then may proceed with Cephalosporin use.     Past Medical History, Surgical history, Social history, and Family History were reviewed and updated.  Review of Systems: Review of Systems  Constitutional: Negative for appetite change, fatigue, fever and unexpected weight change.  HENT:   Negative for lump/mass, mouth sores, sore throat and trouble swallowing.   Respiratory: Negative for cough, hemoptysis and shortness of breath.   Cardiovascular: Negative for leg swelling and palpitations.  Gastrointestinal: Negative for abdominal distention, abdominal pain, blood in stool, constipation, diarrhea, nausea and vomiting.  Genitourinary: Negative for bladder incontinence, dysuria, frequency and hematuria.   Musculoskeletal: Negative for arthralgias, back pain, gait problem and myalgias.  Skin: Negative for itching and rash.  Neurological: Negative for dizziness, extremity weakness, gait problem, headaches, numbness, seizures and speech difficulty.  Hematological: Does not bruise/bleed easily.  Psychiatric/Behavioral: Negative for depression and sleep disturbance. The patient is not nervous/anxious.     Physical Exam:  weight is 128 lb (58.1 kg). Her oral temperature is 98 F (36.7 C). Her blood pressure is 129/59 (abnormal) and her pulse is 62. Her respiration is 17 and oxygen saturation is 100%.   Wt Readings from Last 3 Encounters:  12/23/16 128 lb (58.1 kg)  09/15/16 123 lb (55.8 kg)  08/06/16 124 lb 5 oz (56.4 kg)  Physical Exam  Constitutional: She is oriented to person, place, and time.  HENT:  Head: Normocephalic and atraumatic.  Mouth/Throat: Oropharynx is clear and moist.  Eyes: Pupils are equal, round, and reactive to light. EOM are normal.  Neck: Normal range of motion.  Cardiovascular: Normal rate, regular rhythm and normal heart  sounds.   Pulmonary/Chest: Effort normal and breath sounds normal.  Abdominal: Soft. Bowel sounds are normal.  Musculoskeletal: Normal range of motion. She exhibits no edema, tenderness or deformity.  Lymphadenopathy:    She has no cervical adenopathy.  Neurological: She is alert and oriented to person, place, and time.  Skin: Skin is warm and dry. No rash noted. No erythema.  Psychiatric: She has a normal mood and affect. Her behavior is normal. Judgment and thought content normal.  Vitals reviewed.    Lab Results  Component Value Date   WBC 3.7 (L) 12/23/2016   HGB 10.5 (L) 12/23/2016   HCT 32.3 (L) 12/23/2016   MCV 98 12/23/2016   PLT 196 12/23/2016     Chemistry      Component Value Date/Time   NA 139 09/15/2016 0950   K 4.2 09/15/2016 0950   CL 107 08/06/2016 2101   CO2 26 09/15/2016 0950   BUN 25.0 09/15/2016 0950   CREATININE 1.0 09/15/2016 0950      Component Value Date/Time   CALCIUM 9.9 09/15/2016 0950   ALKPHOS 70 09/15/2016 0950   AST 21 09/15/2016 0950   ALT 13 09/15/2016 0950   BILITOT 0.84 09/15/2016 0950         Impression and Plan: Rachael Davenport is a 71 year old African-American female. She has mild renal insufficiency. She has diabetes. She has hypertension.  I should not be surprised that she has a low erythropoietin level. As such, I think she would benefit from Aranesp.  We have to have the insurance approval first.  I will like to get her hemoglobin above 11. I think that if we get her hemoglobin above 11, that she will feel better.  I want to make sure that she has a good blood count for the upcoming holidays. She has a lot of family and friends that she wants to be able to celebrate Christmas and Thanksgiving with.  We will get her back in 3 weeks. At that time, we should be able to give her Aranesp with insurance approval.   Volanda Napoleon, MD 10/5/201810:41 AM

## 2016-12-24 LAB — VITAMIN B12: VITAMIN B 12: 396 pg/mL (ref 232–1245)

## 2017-01-13 ENCOUNTER — Other Ambulatory Visit (HOSPITAL_BASED_OUTPATIENT_CLINIC_OR_DEPARTMENT_OTHER): Payer: Medicare Other

## 2017-01-13 ENCOUNTER — Ambulatory Visit (HOSPITAL_BASED_OUTPATIENT_CLINIC_OR_DEPARTMENT_OTHER): Payer: Medicare Other | Admitting: Family

## 2017-01-13 ENCOUNTER — Ambulatory Visit (HOSPITAL_BASED_OUTPATIENT_CLINIC_OR_DEPARTMENT_OTHER): Payer: Medicare Other

## 2017-01-13 VITALS — BP 142/63 | HR 88 | Temp 98.1°F | Resp 18 | Wt 128.0 lb

## 2017-01-13 DIAGNOSIS — N189 Chronic kidney disease, unspecified: Secondary | ICD-10-CM

## 2017-01-13 DIAGNOSIS — D631 Anemia in chronic kidney disease: Secondary | ICD-10-CM

## 2017-01-13 LAB — CBC WITH DIFFERENTIAL (CANCER CENTER ONLY)
BASO#: 0 10*3/uL (ref 0.0–0.2)
BASO%: 0.9 % (ref 0.0–2.0)
EOS ABS: 0.1 10*3/uL (ref 0.0–0.5)
EOS%: 2.1 % (ref 0.0–7.0)
HEMATOCRIT: 33.3 % — AB (ref 34.8–46.6)
HEMOGLOBIN: 10.8 g/dL — AB (ref 11.6–15.9)
LYMPH#: 1.4 10*3/uL (ref 0.9–3.3)
LYMPH%: 33.6 % (ref 14.0–48.0)
MCH: 31.9 pg (ref 26.0–34.0)
MCHC: 32.4 g/dL (ref 32.0–36.0)
MCV: 98 fL (ref 81–101)
MONO#: 0.4 10*3/uL (ref 0.1–0.9)
MONO%: 8.2 % (ref 0.0–13.0)
NEUT%: 55.2 % (ref 39.6–80.0)
NEUTROS ABS: 2.4 10*3/uL (ref 1.5–6.5)
PLATELETS: 212 10*3/uL (ref 145–400)
RBC: 3.39 10*6/uL — ABNORMAL LOW (ref 3.70–5.32)
RDW: 12.3 % (ref 11.1–15.7)
WBC: 4.3 10*3/uL (ref 3.9–10.0)

## 2017-01-13 LAB — IRON AND TIBC
%SAT: 29 % (ref 21–57)
IRON: 84 ug/dL (ref 41–142)
TIBC: 286 ug/dL (ref 236–444)
UIBC: 202 ug/dL (ref 120–384)

## 2017-01-13 LAB — CMP (CANCER CENTER ONLY)
ALBUMIN: 4.1 g/dL (ref 3.3–5.5)
ALT(SGPT): 18 U/L (ref 10–47)
AST: 28 U/L (ref 11–38)
Alkaline Phosphatase: 64 U/L (ref 26–84)
BILIRUBIN TOTAL: 1.6 mg/dL (ref 0.20–1.60)
BUN: 14 mg/dL (ref 7–22)
CHLORIDE: 105 meq/L (ref 98–108)
CO2: 29 meq/L (ref 18–33)
CREATININE: 0.8 mg/dL (ref 0.6–1.2)
Calcium: 10 mg/dL (ref 8.0–10.3)
GLUCOSE: 105 mg/dL (ref 73–118)
Potassium: 4.1 mEq/L (ref 3.3–4.7)
SODIUM: 146 meq/L — AB (ref 128–145)
Total Protein: 8 g/dL (ref 6.4–8.1)

## 2017-01-13 LAB — FERRITIN: Ferritin: 263 ng/ml (ref 9–269)

## 2017-01-13 MED ORDER — DARBEPOETIN ALFA 300 MCG/0.6ML IJ SOSY
PREFILLED_SYRINGE | INTRAMUSCULAR | Status: AC
  Filled 2017-01-13: qty 0.6

## 2017-01-13 MED ORDER — DARBEPOETIN ALFA 300 MCG/0.6ML IJ SOSY
300.0000 ug | PREFILLED_SYRINGE | Freq: Once | INTRAMUSCULAR | Status: AC
  Administered 2017-01-13: 300 ug via SUBCUTANEOUS

## 2017-01-13 NOTE — Patient Instructions (Signed)

## 2017-01-13 NOTE — Progress Notes (Signed)
Hematology and Oncology Follow Up Visit  Rachael Davenport [id number] March 25, 1945 71 y.o. 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 She is feeling fatigued. Her Hgb is 10.8 so we will proceed with her first dose of Aranesp today.  No episodes of bleeding, bruising or petechiae. No lymphadenopathy found on exam.  She has had some constipation and is urine prune juice and smooth move tea as needed.  No fever, chills, n/v, cough, rash, dizziness, SOB, chest pain, palpitations, abdominal pain or changes in bladder habits.  No swelling or tenderness in her extremities at this time. She has numbness and tingling in her right hand that comes and goes. She has had this for quite a while and it is unchanged.  She has maintained a good appetite and is staying well hydrated. Her weight is stable.   ECOG Performance Status: 1 - Symptomatic but completely ambulatory  Medications:  Allergies as of 01/13/2017      Reactions   Diovan [valsartan] Other (See Comments)   Heart sped up   Penicillins Itching, Swelling   Has patient had a PCN reaction causing immediate rash, facial/tongue/throat swelling, SOB or lightheadedness with hypotension: Yes Has patient had a PCN reaction causing severe rash involving mucus membranes or skin necrosis: No Has patient had a PCN reaction that required hospitalization: No Has patient had a PCN reaction occurring within the last 10 years: No If all of the above answers are "NO", then may proceed with Cephalosporin use.      Medication List       Accurate as of 01/13/17 10:41 AM. Always use your most recent med list.          aspirin EC 81 MG tablet Take 81 mg by mouth daily.   olmesartan 20 MG tablet Commonly known as:   BENICAR Take 20 mg by mouth daily.   SUMAtriptan 25 MG tablet Commonly known as:  IMITREX Take 25 mg by mouth once as needed for migraine (MAY REPEAT ONCE IN 2 HOURS IF NO RELIEF (MAX 2 TABLETS/24 HOURS)).   Vitamin D (Ergocalciferol) 50000 units Caps capsule Commonly known as:  DRISDOL Take 50,000 Units by mouth every 7 (seven) days.       Allergies:  Allergies  Allergen Reactions  . Diovan [Valsartan] Other (See Comments)    Heart sped up  . Penicillins Itching and Swelling    Has patient had a PCN reaction causing immediate rash, facial/tongue/throat swelling, SOB or lightheadedness with hypotension: Yes Has patient had a PCN reaction causing severe rash involving mucus membranes or skin necrosis: No Has patient had a PCN reaction that required hospitalization: No Has patient had a PCN reaction occurring within the last 10 years: No If all of the above answers are "NO", then may proceed with Cephalosporin use.     Past Medical History, Surgical history, Social history, and Family History were reviewed and updated.  Review of Systems: All other 10 point review of systems is negative.   Physical Exam:  vitals were not taken for this visit.  Wt Readings from Last 3 Encounters:  12/23/16 128 lb (58.1 kg)  09/15/16 123 lb (55.8 kg)  08/06/16 124 lb 5 oz (56.4 kg)    Ocular: Sclerae unicteric, pupils equal, round and reactive to light Ear-nose-throat: Oropharynx clear,  dentition fair Lymphatic: No cervical, supraclavicular or axillary adenopathy Lungs no rales or rhonchi, good excursion bilaterally Heart regular rate and rhythm, no murmur appreciated Abd soft, nontender, positive bowel sounds, no liver or spleen tip palpated on exam, no fluid wave  MSK no focal spinal tenderness, no joint edema Neuro: non-focal, well-oriented, appropriate affect Breasts: Deferred   Lab Results  Component Value Date   WBC 4.3 01/13/2017   HGB 10.8 (L) 01/13/2017   HCT 33.3 (L)  01/13/2017   MCV 98 01/13/2017   PLT 212 01/13/2017   Lab Results  Component Value Date   FERRITIN 215 12/23/2016   IRON 74 12/23/2016   TIBC 273 12/23/2016   UIBC 199 12/23/2016   IRONPCTSAT 27 12/23/2016   Lab Results  Component Value Date   RBC 3.39 (L) 01/13/2017   No results found for: KPAFRELGTCHN, LAMBDASER, KAPLAMBRATIO No results found for: IGGSERUM, IGA, IGMSERUM No results found for: Odetta Pink, SPEI   Chemistry      Component Value Date/Time   NA 142 12/23/2016 0951   K 3.9 12/23/2016 0951   CL 107 08/06/2016 2101   CO2 25 12/23/2016 0951   BUN 15.0 12/23/2016 0951   CREATININE 0.9 12/23/2016 0951      Component Value Date/Time   CALCIUM 9.5 12/23/2016 0951   ALKPHOS 69 12/23/2016 0951   AST 26 12/23/2016 0951   ALT 16 12/23/2016 0951   BILITOT 0.96 12/23/2016 0951      Impression and Plan: Rachael Davenport is a very pleasant 71 yo African American female with chronic renal insufficiency and erythropoietin deficiency anemia. Hgb is 10.8 and she is symptomatic with fatigue.  We will give her Aranesp today and plan to see her back again in a month for repepat lab work and follow-up.  She will contact our office with any questions or concerns. We can certainly see her sooner if need be.   Eliezer Bottom, NP 10/26/201810:41 AM

## 2017-01-14 LAB — RETICULOCYTES: Reticulocyte Count: 1.3 % (ref 0.6–2.6)

## 2017-02-13 ENCOUNTER — Other Ambulatory Visit: Payer: Self-pay

## 2017-02-13 ENCOUNTER — Ambulatory Visit: Payer: Medicare Other

## 2017-02-13 ENCOUNTER — Other Ambulatory Visit (HOSPITAL_BASED_OUTPATIENT_CLINIC_OR_DEPARTMENT_OTHER): Payer: Medicare Other

## 2017-02-13 ENCOUNTER — Encounter: Payer: Self-pay | Admitting: Family

## 2017-02-13 ENCOUNTER — Ambulatory Visit (HOSPITAL_BASED_OUTPATIENT_CLINIC_OR_DEPARTMENT_OTHER): Payer: Medicare Other | Admitting: Family

## 2017-02-13 VITALS — BP 120/59 | HR 57 | Temp 98.0°F | Resp 16 | Wt 127.0 lb

## 2017-02-13 DIAGNOSIS — N189 Chronic kidney disease, unspecified: Secondary | ICD-10-CM

## 2017-02-13 DIAGNOSIS — D631 Anemia in chronic kidney disease: Secondary | ICD-10-CM

## 2017-02-13 DIAGNOSIS — G8929 Other chronic pain: Secondary | ICD-10-CM | POA: Diagnosis not present

## 2017-02-13 DIAGNOSIS — M542 Cervicalgia: Secondary | ICD-10-CM | POA: Diagnosis not present

## 2017-02-13 LAB — CMP (CANCER CENTER ONLY)
ALT(SGPT): 18 U/L (ref 10–47)
AST: 23 U/L (ref 11–38)
Albumin: 3.8 g/dL (ref 3.3–5.5)
Alkaline Phosphatase: 60 U/L (ref 26–84)
BUN: 22 mg/dL (ref 7–22)
CHLORIDE: 108 meq/L (ref 98–108)
CO2: 28 mEq/L (ref 18–33)
Calcium: 9.7 mg/dL (ref 8.0–10.3)
Creat: 1.1 mg/dl (ref 0.6–1.2)
GLUCOSE: 91 mg/dL (ref 73–118)
POTASSIUM: 4.6 meq/L (ref 3.3–4.7)
Sodium: 145 mEq/L (ref 128–145)
TOTAL PROTEIN: 7.3 g/dL (ref 6.4–8.1)
Total Bilirubin: 1 mg/dl (ref 0.20–1.60)

## 2017-02-13 LAB — CBC WITH DIFFERENTIAL (CANCER CENTER ONLY)
BASO#: 0 10*3/uL (ref 0.0–0.2)
BASO%: 0.9 % (ref 0.0–2.0)
EOS ABS: 0.1 10*3/uL (ref 0.0–0.5)
EOS%: 3.1 % (ref 0.0–7.0)
HEMATOCRIT: 37.7 % (ref 34.8–46.6)
HGB: 12.2 g/dL (ref 11.6–15.9)
LYMPH#: 1.4 10*3/uL (ref 0.9–3.3)
LYMPH%: 42 % (ref 14.0–48.0)
MCH: 31.5 pg (ref 26.0–34.0)
MCHC: 32.4 g/dL (ref 32.0–36.0)
MCV: 97 fL (ref 81–101)
MONO#: 0.4 10*3/uL (ref 0.1–0.9)
MONO%: 10.8 % (ref 0.0–13.0)
NEUT#: 1.4 10*3/uL — ABNORMAL LOW (ref 1.5–6.5)
NEUT%: 43.2 % (ref 39.6–80.0)
PLATELETS: 151 10*3/uL (ref 145–400)
RBC: 3.87 10*6/uL (ref 3.70–5.32)
RDW: 12.5 % (ref 11.1–15.7)
WBC: 3.2 10*3/uL — AB (ref 3.9–10.0)

## 2017-02-13 NOTE — Progress Notes (Signed)
Hematology and Oncology Follow Up Visit  Rachael Davenport [id number] Jun 24, 1945 71 y.o. 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 She is ding quite well and has no complaints at this time. No fatigue. She received Aranesp in October and has responded nicely. Hgb today is 12.2 with an MCV of 97.  No episodes of bleeding, no bruising or petechiae. No lymphadenopathy found on exam.  No fever, chills, n/v, cough, rash, dizziness, SOB, chest pain, palpitations, abdominal pain or changes in bowel or bladder habits.  No swelling or tenderness in her extremities. She states that she has chronic neck issues and will have occasional numbness and tingling in her extremities.  She has maintained a good appetite and is staying well hydrated. Her weight is stable.   ECOG Performance Status: 0 - Asymptomatic  Medications:  Allergies as of 02/13/2017      Reactions   Diovan [valsartan] Other (See Comments)   Heart sped up   Penicillins Itching, Swelling   Has patient had a PCN reaction causing immediate rash, facial/tongue/throat swelling, SOB or lightheadedness with hypotension: Yes Has patient had a PCN reaction causing severe rash involving mucus membranes or skin necrosis: No Has patient had a PCN reaction that required hospitalization: No Has patient had a PCN reaction occurring within the last 10 years: No If all of the above answers are "NO", then may proceed with Cephalosporin use.      Medication List        Accurate as of 02/13/17  1:41 PM. Always use your most recent med list.          aspirin EC 81 MG tablet Take 81 mg by mouth daily.   olmesartan 20 MG tablet Commonly known as:  BENICAR Take 20 mg by mouth daily.   SUMAtriptan 25 MG  tablet Commonly known as:  IMITREX Take 25 mg by mouth once as needed for migraine (MAY REPEAT ONCE IN 2 HOURS IF NO RELIEF (MAX 2 TABLETS/24 HOURS)).   Vitamin D (Ergocalciferol) 50000 units Caps capsule Commonly known as:  DRISDOL Take 50,000 Units by mouth every 7 (seven) days.       Allergies:  Allergies  Allergen Reactions  . Diovan [Valsartan] Other (See Comments)    Heart sped up  . Penicillins Itching and Swelling    Has patient had a PCN reaction causing immediate rash, facial/tongue/throat swelling, SOB or lightheadedness with hypotension: Yes Has patient had a PCN reaction causing severe rash involving mucus membranes or skin necrosis: No Has patient had a PCN reaction that required hospitalization: No Has patient had a PCN reaction occurring within the last 10 years: No If all of the above answers are "NO", then may proceed with Cephalosporin use.     Past Medical History, Surgical history, Social history, and Family History were reviewed and updated.  Review of Systems: All other 10 point review of systems is negative.   Physical Exam:  weight is 127 lb (57.6 kg). Her oral temperature is 98 F (36.7 C). Her blood pressure is 120/59 (abnormal) and her pulse is 57 (abnormal). Her respiration is 16 and oxygen saturation is 100%.   Wt Readings from Last 3 Encounters:  02/13/17 127 lb (57.6 kg)  01/13/17 128 lb (58.1 kg)  12/23/16 128 lb (58.1 kg)  Ocular: Sclerae unicteric, pupils equal, round and reactive to light Ear-nose-throat: Oropharynx clear, dentition fair Lymphatic: No cervical, supraclavicular or axillary adenopathy Lungs no rales or rhonchi, good excursion bilaterally Heart regular rate and rhythm, no murmur appreciated Abd soft, nontender, positive bowel sounds, no liver or spleen tip palpated on exam, no fluid wave  MSK no focal spinal tenderness, no joint edema Neuro: non-focal, well-oriented, appropriate affect Breasts: Deferred   Lab  Results  Component Value Date   WBC 3.2 (L) 02/13/2017   HGB 12.2 02/13/2017   HCT 37.7 02/13/2017   MCV 97 02/13/2017   PLT 151 02/13/2017   Lab Results  Component Value Date   FERRITIN 263 01/13/2017   IRON 84 01/13/2017   TIBC 286 01/13/2017   UIBC 202 01/13/2017   IRONPCTSAT 29 01/13/2017   Lab Results  Component Value Date   RBC 3.87 02/13/2017   No results found for: KPAFRELGTCHN, LAMBDASER, KAPLAMBRATIO No results found for: Kandis Cocking, IGMSERUM No results found for: Odetta Pink, SPEI   Chemistry      Component Value Date/Time   NA 145 02/13/2017 1046   NA 142 12/23/2016 0951   K 4.6 02/13/2017 1046   K 3.9 12/23/2016 0951   CL 108 02/13/2017 1046   CO2 28 02/13/2017 1046   CO2 25 12/23/2016 0951   BUN 22 02/13/2017 1046   BUN 15.0 12/23/2016 0951   CREATININE 1.1 02/13/2017 1046   CREATININE 0.9 12/23/2016 0951      Component Value Date/Time   CALCIUM 9.7 02/13/2017 1046   CALCIUM 9.5 12/23/2016 0951   ALKPHOS 60 02/13/2017 1046   ALKPHOS 69 12/23/2016 0951   AST 23 02/13/2017 1046   AST 26 12/23/2016 0951   ALT 18 02/13/2017 1046   ALT 16 12/23/2016 0951   BILITOT 1.00 02/13/2017 1046   BILITOT 0.96 12/23/2016 0951      Impression and Plan: Rachael Davenport is a very pleasant 71 yo African American female with chronic renal insufficiency and erythropoietin deficiency anemia. She responded nicely to Aranesp last month and Hgb is now 12.2. She has no complaints at this time.  No Aranesp needed this visit.  We will plan to see her back again in another 6 weeks for follow-up and lab.  She will contact our office with any questions or concerns. We can certainly see her sooner if need be.   Eliezer Bottom, NP 11/26/20181:41 PM

## 2017-03-10 ENCOUNTER — Other Ambulatory Visit: Payer: Self-pay

## 2017-03-10 NOTE — Patient Outreach (Signed)
[facility]) Care Management  03/10/2017  Rachael Davenport 06-06-1945 [id number]   Medication Adherence Call to [name] patient is showing past due under [facility] Ins.on Olmesartan 20 mg patient did not answer ,call [facility] they said patient already pick up this medication on 03/02/17 patient wont be due until January 2019.  [facility] Direct Dial [phone]  Fax [phone] [name]

## 2017-03-27 ENCOUNTER — Other Ambulatory Visit: Payer: Self-pay

## 2017-03-27 ENCOUNTER — Inpatient Hospital Stay: Payer: Medicare Other

## 2017-03-27 ENCOUNTER — Encounter: Payer: Self-pay | Admitting: Family

## 2017-03-27 ENCOUNTER — Inpatient Hospital Stay: Payer: Medicare Other | Attending: Family | Admitting: Family

## 2017-03-27 VITALS — BP 113/55 | HR 65 | Temp 98.1°F | Resp 16 | Wt 126.0 lb

## 2017-03-27 DIAGNOSIS — D631 Anemia in chronic kidney disease: Secondary | ICD-10-CM | POA: Diagnosis not present

## 2017-03-27 DIAGNOSIS — K5900 Constipation, unspecified: Secondary | ICD-10-CM | POA: Diagnosis not present

## 2017-03-27 DIAGNOSIS — Z79899 Other long term (current) drug therapy: Secondary | ICD-10-CM | POA: Diagnosis not present

## 2017-03-27 DIAGNOSIS — Z7982 Long term (current) use of aspirin: Secondary | ICD-10-CM | POA: Insufficient documentation

## 2017-03-27 DIAGNOSIS — I129 Hypertensive chronic kidney disease with stage 1 through stage 4 chronic kidney disease, or unspecified chronic kidney disease: Secondary | ICD-10-CM | POA: Insufficient documentation

## 2017-03-27 DIAGNOSIS — N189 Chronic kidney disease, unspecified: Secondary | ICD-10-CM | POA: Insufficient documentation

## 2017-03-27 LAB — CMP (CANCER CENTER ONLY)
ALBUMIN: 4 g/dL (ref 3.5–5.0)
ALK PHOS: 76 U/L (ref 40–150)
ALT: 18 U/L (ref 0–55)
AST: 21 U/L (ref 5–34)
Anion gap: 7 (ref 3–11)
BILIRUBIN TOTAL: 0.8 mg/dL (ref 0.2–1.2)
BUN: 28 mg/dL — AB (ref 7–26)
CALCIUM: 9.7 mg/dL (ref 8.4–10.4)
CO2: 26 mmol/L (ref 22–29)
CREATININE: 1.07 mg/dL (ref 0.70–1.30)
Chloride: 105 mmol/L (ref 98–109)
GFR, EST NON AFRICAN AMERICAN: 51 mL/min — AB (ref >60)
GFR, Est AFR Am: 59 mL/min — ABNORMAL LOW (ref >60)
GLUCOSE: 89 mg/dL (ref 70–140)
POTASSIUM: 5.3 mmol/L — AB (ref 3.3–4.7)
Sodium: 138 mmol/L (ref 136–145)
TOTAL PROTEIN: 7.7 g/dL (ref 6.4–8.3)

## 2017-03-27 LAB — CBC WITH DIFFERENTIAL (CANCER CENTER ONLY)
Abs Granulocyte: 2.6 10*3/uL (ref 1.5–6.5)
BASOS PCT: 0 %
Basophils Absolute: 0 10*3/uL (ref 0.0–0.1)
EOS ABS: 0.1 10*3/uL (ref 0.0–0.5)
EOS PCT: 2 %
HCT: 32.6 % — ABNORMAL LOW (ref 34.8–46.6)
Hemoglobin: 10.4 g/dL — ABNORMAL LOW (ref 11.6–15.9)
Lymphocytes Relative: 37 %
Lymphs Abs: 1.8 10*3/uL (ref 0.9–3.3)
MCH: 31 pg (ref 26.0–34.0)
MCHC: 31.9 g/dL — AB (ref 32.0–36.0)
MCV: 97.3 fL (ref 81.0–101.0)
MONO ABS: 0.5 10*3/uL (ref 0.1–0.9)
MONOS PCT: 10 %
Neutro Abs: 2.6 10*3/uL (ref 1.5–6.5)
Neutrophils Relative %: 51 %
PLATELETS: 211 10*3/uL (ref 145–400)
RBC: 3.35 MIL/uL — AB (ref 3.70–5.32)
RDW: 12.5 % (ref 11.1–15.7)
WBC: 5 10*3/uL (ref 4.0–10.3)

## 2017-03-27 MED ORDER — DARBEPOETIN ALFA 300 MCG/0.6ML IJ SOSY
300.0000 ug | PREFILLED_SYRINGE | Freq: Once | INTRAMUSCULAR | Status: AC
  Administered 2017-03-27: 300 ug via SUBCUTANEOUS

## 2017-03-27 NOTE — Patient Instructions (Signed)
Darbepoetin Alfa injection (Aranesp) What is this medicine? DARBEPOETIN ALFA (dar be POE e tin AL fa) helps your body make more red blood cells. It is used to treat anemia caused by chronic kidney failure and chemotherapy. This medicine may be used for other purposes; ask your health care provider or pharmacist if you have questions. COMMON BRAND NAME(S): Aranesp What should I tell my health care provider before I take this medicine? They need to know if you have any of these conditions: -blood clotting disorders or history of blood clots -cancer patient not on chemotherapy -cystic fibrosis -heart disease, such as angina, heart failure, or a history of a heart attack -hemoglobin level of 12 g/dL or greater -high blood pressure -low levels of folate, iron, or vitamin B12 -seizures -an unusual or allergic reaction to darbepoetin, erythropoietin, albumin, hamster proteins, latex, other medicines, foods, dyes, or preservatives -pregnant or trying to get pregnant -breast-feeding How should I use this medicine? This medicine is for injection into a vein or under the skin. It is usually given by a health care professional in a hospital or clinic setting. If you get this medicine at home, you will be taught how to prepare and give this medicine. Use exactly as directed. Take your medicine at regular intervals. Do not take your medicine more often than directed. It is important that you put your used needles and syringes in a special sharps container. Do not put them in a trash can. If you do not have a sharps container, call your pharmacist or healthcare provider to get one. A special MedGuide will be given to you by the pharmacist with each prescription and refill. Be sure to read this information carefully each time. Talk to your pediatrician regarding the use of this medicine in children. While this medicine may be used in children as young as 1 year for selected conditions, precautions do  apply. Overdosage: If you think you have taken too much of this medicine contact a poison control center or emergency room at once. NOTE: This medicine is only for you. Do not share this medicine with others. What if I miss a dose? If you miss a dose, take it as soon as you can. If it is almost time for your next dose, take only that dose. Do not take double or extra doses. What may interact with this medicine? Do not take this medicine with any of the following medications: -epoetin alfa This list may not describe all possible interactions. Give your health care provider a list of all the medicines, herbs, non-prescription drugs, or dietary supplements you use. Also tell them if you smoke, drink alcohol, or use illegal drugs. Some items may interact with your medicine. What should I watch for while using this medicine? Your condition will be monitored carefully while you are receiving this medicine. You may need blood work done while you are taking this medicine. What side effects may I notice from receiving this medicine? Side effects that you should report to your doctor or health care professional as soon as possible: -allergic reactions like skin rash, itching or hives, swelling of the face, lips, or tongue -breathing problems -changes in vision -chest pain -confusion, trouble speaking or understanding -feeling faint or lightheaded, falls -high blood pressure -muscle aches or pains -pain, swelling, warmth in the leg -rapid weight gain -severe headaches -sudden numbness or weakness of the face, arm or leg -trouble walking, dizziness, loss of balance or coordination -seizures (convulsions) -swelling of the ankles, feet,   hands -unusually weak or tired Side effects that usually do not require medical attention (report to your doctor or health care professional if they continue or are bothersome): -diarrhea -fever, chills (flu-like symptoms) -headaches -nausea, vomiting -redness,  stinging, or swelling at site where injected This list may not describe all possible side effects. Call your doctor for medical advice about side effects. You may report side effects to FDA at 1-800-FDA-1088. Where should I keep my medicine? Keep out of the reach of children. Store in a refrigerator between 2 and 8 degrees C (36 and 46 degrees F). Do not freeze. Do not shake. Throw away any unused portion if using a single-dose vial. Throw away any unused medicine after the expiration date. NOTE: This sheet is a summary. It may not cover all possible information. If you have questions about this medicine, talk to your doctor, pharmacist, or health care provider.  2018 Elsevier/Gold Standard (2015-10-26 19:52:26)  

## 2017-03-27 NOTE — Progress Notes (Signed)
Hematology and Oncology Follow Up Visit  Rachael Davenport [id number] [birth year] 72 y.o. 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 She is doing quite well and has no complaints at this time. Hgb is 10.4 so she will get her Aranesp this visit.  She stays busy with work and plans to start back walking for exercise this week.  No fever, chills, n/v, cough, rash, dizziness, SOB, chest pain, palpitations, abdominal pain or changes in bowel or bladder habits.  She has occasional constipation and will either use smooth move tea or a prune laxative as needed.  No swelling, tenderness, numbness or tingling in her extremities. No c/o pain.  She has maintained a good appetite and is staying well hydrated. Her weight is stable.   ECOG Performance Status: 1 - Symptomatic but completely ambulatory  Medications:  Allergies as of 03/27/2017      Reactions   Diovan [valsartan] Other (See Comments)   Heart sped up   Penicillins Itching, Swelling   Has patient had a PCN reaction causing immediate rash, facial/tongue/throat swelling, SOB or lightheadedness with hypotension: Yes Has patient had a PCN reaction causing severe rash involving mucus membranes or skin necrosis: No Has patient had a PCN reaction that required hospitalization: No Has patient had a PCN reaction occurring within the last 10 years: No If all of the above answers are "NO", then may proceed with Cephalosporin use.      Medication List        Accurate as of 03/27/17 11:21 AM. Always use your most recent med list.          aspirin EC 81 MG tablet Take 81 mg by mouth daily.   olmesartan 20 MG tablet Commonly known as:  BENICAR Take 20 mg by mouth daily.   SUMAtriptan 25 MG tablet Commonly known as:  IMITREX Take 25 mg by  mouth once as needed for migraine (MAY REPEAT ONCE IN 2 HOURS IF NO RELIEF (MAX 2 TABLETS/24 HOURS)).   Vitamin D (Ergocalciferol) 50000 units Caps capsule Commonly known as:  DRISDOL Take 50,000 Units by mouth every 7 (seven) days.       Allergies:  Allergies  Allergen Reactions  . Diovan [Valsartan] Other (See Comments)    Heart sped up  . Penicillins Itching and Swelling    Has patient had a PCN reaction causing immediate rash, facial/tongue/throat swelling, SOB or lightheadedness with hypotension: Yes Has patient had a PCN reaction causing severe rash involving mucus membranes or skin necrosis: No Has patient had a PCN reaction that required hospitalization: No Has patient had a PCN reaction occurring within the last 10 years: No If all of the above answers are "NO", then may proceed with Cephalosporin use.     Past Medical History, Surgical history, Social history, and Family History were reviewed and updated.  Review of Systems: All other 10 point review of systems is negative.   Physical Exam:  vitals were not taken for this visit.   Wt Readings from Last 3 Encounters:  02/13/17 127 lb (57.6 kg)  01/13/17 128 lb (58.1 kg)  12/23/16 128 lb (58.1 kg)    Ocular: Sclerae unicteric, pupils equal, round and reactive to light Ear-nose-throat: Oropharynx clear, dentition fair Lymphatic: No cervical, supraclavicular or axillary adenopathy Lungs no rales or  rhonchi, good excursion bilaterally Heart regular rate and rhythm, no murmur appreciated Abd soft, nontender, positive bowel sounds, no liver or spleen tip palpated on exam, no fluid wave  MSK no focal spinal tenderness, no joint edema Neuro: non-focal, well-oriented, appropriate affect Breasts: Deferred   Lab Results  Component Value Date   WBC 3.2 (L) 02/13/2017   HGB 12.2 02/13/2017   HCT 32.6 (L) 03/27/2017   MCV 97.3 03/27/2017   PLT 151 02/13/2017   Lab Results  Component Value Date   FERRITIN 263  01/13/2017   IRON 84 01/13/2017   TIBC 286 01/13/2017   UIBC 202 01/13/2017   IRONPCTSAT 29 01/13/2017   Lab Results  Component Value Date   RBC 3.35 (L) 03/27/2017   No results found for: KPAFRELGTCHN, LAMBDASER, KAPLAMBRATIO No results found for: Kandis Cocking, IGMSERUM No results found for: Odetta Pink, SPEI   Chemistry      Component Value Date/Time   NA 145 02/13/2017 1046   NA 142 12/23/2016 0951   K 4.6 02/13/2017 1046   K 3.9 12/23/2016 0951   CL 108 02/13/2017 1046   CO2 28 02/13/2017 1046   CO2 25 12/23/2016 0951   BUN 22 02/13/2017 1046   BUN 15.0 12/23/2016 0951   CREATININE 1.1 02/13/2017 1046   CREATININE 0.9 12/23/2016 0951      Component Value Date/Time   CALCIUM 9.7 02/13/2017 1046   CALCIUM 9.5 12/23/2016 0951   ALKPHOS 60 02/13/2017 1046   ALKPHOS 69 12/23/2016 0951   AST 23 02/13/2017 1046   AST 26 12/23/2016 0951   ALT 18 02/13/2017 1046   ALT 16 12/23/2016 0951   BILITOT 1.00 02/13/2017 1046   BILITOT 0.96 12/23/2016 0951      Impression and Plan: Rachael Davenport is a very pleasant 72 yo African American female with chronic renal insufficiency and erythropoietin deficiency anemia. She continues to do well and has responded nicely to Aranesp. She has no complaints at this time.  Hgb today is 10.4 so she will get Aranesp this visit.  We will plan to see her back in another 2 months for follow-up.  She will contact our office with any questions or concerns. We can certainly see her sooner if need be.   Laverna Peace, NP 1/7/201911:21 AM

## 2017-04-07 ENCOUNTER — Ambulatory Visit (HOSPITAL_COMMUNITY)
Admission: RE | Admit: 2017-04-07 | Discharge: 2017-04-07 | Disposition: A | Discharge status: Home / Self Care | Payer: Medicare Other | Source: Ambulatory Visit | Attending: Pulmonary Disease | Admitting: Pulmonary Disease

## 2017-04-07 ENCOUNTER — Other Ambulatory Visit (HOSPITAL_COMMUNITY): Payer: Self-pay | Admitting: Pulmonary Disease

## 2017-04-07 DIAGNOSIS — R200 Anesthesia of skin: Secondary | ICD-10-CM

## 2017-04-07 DIAGNOSIS — M50323 Other cervical disc degeneration at C6-C7 level: Secondary | ICD-10-CM | POA: Diagnosis not present

## 2017-04-07 DIAGNOSIS — M4802 Spinal stenosis, cervical region: Secondary | ICD-10-CM | POA: Diagnosis not present

## 2017-05-22 ENCOUNTER — Inpatient Hospital Stay: Payer: Medicare Other

## 2017-05-22 ENCOUNTER — Inpatient Hospital Stay: Payer: Medicare Other | Attending: Family | Admitting: Family

## 2017-05-22 ENCOUNTER — Encounter: Payer: Self-pay | Admitting: Family

## 2017-05-22 ENCOUNTER — Other Ambulatory Visit: Payer: Self-pay

## 2017-05-22 VITALS — BP 119/50 | HR 92 | Temp 98.4°F | Resp 19 | Wt 128.0 lb

## 2017-05-22 DIAGNOSIS — Z7982 Long term (current) use of aspirin: Secondary | ICD-10-CM | POA: Insufficient documentation

## 2017-05-22 DIAGNOSIS — I129 Hypertensive chronic kidney disease with stage 1 through stage 4 chronic kidney disease, or unspecified chronic kidney disease: Secondary | ICD-10-CM | POA: Diagnosis not present

## 2017-05-22 DIAGNOSIS — J00 Acute nasopharyngitis [common cold]: Secondary | ICD-10-CM | POA: Insufficient documentation

## 2017-05-22 DIAGNOSIS — R002 Palpitations: Secondary | ICD-10-CM | POA: Diagnosis not present

## 2017-05-22 DIAGNOSIS — R5383 Other fatigue: Secondary | ICD-10-CM | POA: Insufficient documentation

## 2017-05-22 DIAGNOSIS — D631 Anemia in chronic kidney disease: Secondary | ICD-10-CM

## 2017-05-22 DIAGNOSIS — N189 Chronic kidney disease, unspecified: Secondary | ICD-10-CM | POA: Insufficient documentation

## 2017-05-22 DIAGNOSIS — Z79899 Other long term (current) drug therapy: Secondary | ICD-10-CM | POA: Diagnosis not present

## 2017-05-22 LAB — CBC WITH DIFFERENTIAL (CANCER CENTER ONLY)
Basophils Absolute: 0 10*3/uL (ref 0.0–0.1)
Basophils Relative: 1 %
EOS ABS: 0.2 10*3/uL (ref 0.0–0.5)
Eosinophils Relative: 3 %
HCT: 32.9 % — ABNORMAL LOW (ref 34.8–46.6)
Hemoglobin: 10.5 g/dL — ABNORMAL LOW (ref 11.6–15.9)
LYMPHS ABS: 1.2 10*3/uL (ref 0.9–3.3)
Lymphocytes Relative: 23 %
MCH: 31.5 pg (ref 26.0–34.0)
MCHC: 31.9 g/dL — AB (ref 32.0–36.0)
MCV: 98.8 fL (ref 81.0–101.0)
Monocytes Absolute: 0.5 10*3/uL (ref 0.1–0.9)
Monocytes Relative: 9 %
NEUTROS PCT: 64 %
Neutro Abs: 3.5 10*3/uL (ref 1.5–6.5)
PLATELETS: 218 10*3/uL (ref 145–400)
RBC: 3.33 MIL/uL — AB (ref 3.70–5.32)
RDW: 13.4 % (ref 11.1–15.7)
WBC: 5.5 10*3/uL (ref 3.9–10.0)

## 2017-05-22 LAB — CMP (CANCER CENTER ONLY)
ALK PHOS: 37 U/L — AB (ref 40–150)
ALT: 44 U/L (ref 0–55)
AST: 47 U/L — AB (ref 5–34)
Albumin: 4 g/dL (ref 3.5–5.0)
Anion gap: 9 (ref 3–11)
BILIRUBIN TOTAL: 0.8 mg/dL (ref 0.2–1.2)
BUN: 27 mg/dL — AB (ref 7–26)
CALCIUM: 10.1 mg/dL (ref 8.4–10.4)
CHLORIDE: 107 mmol/L (ref 98–109)
CO2: 25 mmol/L (ref 22–29)
CREATININE: 1.43 mg/dL — AB (ref 0.60–1.10)
GFR, EST AFRICAN AMERICAN: 41 mL/min — AB (ref >60)
GFR, EST NON AFRICAN AMERICAN: 36 mL/min — AB (ref >60)
Glucose, Bld: 108 mg/dL (ref 70–140)
Potassium: 4.6 mmol/L (ref 3.5–5.1)
Sodium: 141 mmol/L (ref 136–145)
Total Protein: 7.5 g/dL (ref 6.4–8.3)

## 2017-05-22 MED ORDER — DARBEPOETIN ALFA 300 MCG/0.6ML IJ SOSY
PREFILLED_SYRINGE | INTRAMUSCULAR | Status: AC
  Filled 2017-05-22: qty 0.6

## 2017-05-22 MED ORDER — DARBEPOETIN ALFA 300 MCG/0.6ML IJ SOSY
300.0000 ug | PREFILLED_SYRINGE | Freq: Once | INTRAMUSCULAR | Status: AC
  Administered 2017-05-22: 300 ug via SUBCUTANEOUS

## 2017-05-22 NOTE — Progress Notes (Signed)
Hematology and Oncology Follow Up Visit  Rachael Davenport [id number] 1945/04/05 72 y.o. 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 She is getting over a cold and is still a little fatigued. She has had no fever, chills, n/v, cough, rash, dizziness, SOB, chest pain, abdominal pain or changes in bowel or bladder habits.  Hgb is 10.5, WBC 5.5 and 218.  She has occasional palpitations when laying on her left side. If this persists, she plans to follow-up with her PCP.  No episodes of bleeding, no bruising or petechiae. No lymphadenopathy found on exam.  The numbness and tingling in her fingertips due to chronic neck issues. No swelling or tenderness in her extremities.  She has a good appetite and is staying well hydrated. Her weight is stable.   ECOG Performance Status: 1 - Symptomatic but completely ambulatory  Medications:  Allergies as of 05/22/2017      Reactions   Diovan [valsartan] Other (See Comments)   Heart sped up   Penicillins Itching, Swelling   Has patient had a PCN reaction causing immediate rash, facial/tongue/throat swelling, SOB or lightheadedness with hypotension: Yes Has patient had a PCN reaction causing severe rash involving mucus membranes or skin necrosis: No Has patient had a PCN reaction that required hospitalization: No Has patient had a PCN reaction occurring within the last 10 years: No If all of the above answers are "NO", then may proceed with Cephalosporin use.      Medication List        Accurate as of 05/22/17 11:05 AM. Always use your most recent med list.          aspirin EC 81 MG tablet Take 81 mg by mouth daily.   olmesartan 20 MG tablet Commonly known as:  BENICAR Take 20 mg by mouth daily.   SUMAtriptan 25 MG tablet Commonly known as:   IMITREX Take 25 mg by mouth once as needed for migraine (MAY REPEAT ONCE IN 2 HOURS IF NO RELIEF (MAX 2 TABLETS/24 HOURS)).   Vitamin D (Ergocalciferol) 50000 units Caps capsule Commonly known as:  DRISDOL Take 50,000 Units by mouth every 7 (seven) days.       Allergies:  Allergies  Allergen Reactions  . Diovan [Valsartan] Other (See Comments)    Heart sped up  . Penicillins Itching and Swelling    Has patient had a PCN reaction causing immediate rash, facial/tongue/throat swelling, SOB or lightheadedness with hypotension: Yes Has patient had a PCN reaction causing severe rash involving mucus membranes or skin necrosis: No Has patient had a PCN reaction that required hospitalization: No Has patient had a PCN reaction occurring within the last 10 years: No If all of the above answers are "NO", then may proceed with Cephalosporin use.     Past Medical History, Surgical history, Social history, and Family History were reviewed and updated.  Review of Systems: All other 10 point review of systems is negative.   Physical Exam:  vitals were not taken for this visit.   Wt Readings from Last 3 Encounters:  03/27/17 126 lb (57.2 kg)  02/13/17 127 lb (57.6 kg)  01/13/17 128 lb (58.1 kg)    Ocular: Sclerae unicteric, pupils equal, round and reactive to light Ear-nose-throat: Oropharynx clear, dentition fair Lymphatic: No cervical, supraclavicular or axillary adenopathy  Lungs no rales or rhonchi, good excursion bilaterally Heart regular rate and rhythm, no murmur appreciated Abd soft, nontender, positive bowel sounds, no liver or spleen tip palpated on exam, no fluid wave  MSK no focal spinal tenderness, no joint edema Neuro: non-focal, well-oriented, appropriate affect Breasts: Deferred    Lab Results  Component Value Date   WBC 5.0 03/27/2017   HGB 12.2 02/13/2017   HCT 32.6 (L) 03/27/2017   MCV 97.3 03/27/2017   PLT 211 03/27/2017   Lab Results  Component Value Date    FERRITIN 263 01/13/2017   IRON 84 01/13/2017   TIBC 286 01/13/2017   UIBC 202 01/13/2017   IRONPCTSAT 29 01/13/2017   Lab Results  Component Value Date   RBC 3.35 (L) 03/27/2017   No results found for: KPAFRELGTCHN, LAMBDASER, KAPLAMBRATIO No results found for: Kandis Cocking, IGMSERUM No results found for: Odetta Pink, SPEI   Chemistry      Component Value Date/Time   NA 138 03/27/2017 1043   NA 145 02/13/2017 1046   NA 142 12/23/2016 0951   K 5.3 (H) 03/27/2017 1043   K 4.6 02/13/2017 1046   K 3.9 12/23/2016 0951   CL 105 03/27/2017 1043   CL 108 02/13/2017 1046   CO2 26 03/27/2017 1043   CO2 28 02/13/2017 1046   CO2 25 12/23/2016 0951   BUN 28 (H) 03/27/2017 1043   BUN 22 02/13/2017 1046   BUN 15.0 12/23/2016 0951   CREATININE 1.07 03/27/2017 1043   CREATININE 1.1 02/13/2017 1046   CREATININE 0.9 12/23/2016 0951      Component Value Date/Time   CALCIUM 9.7 03/27/2017 1043   CALCIUM 9.7 02/13/2017 1046   CALCIUM 9.5 12/23/2016 0951   ALKPHOS 76 03/27/2017 1043   ALKPHOS 60 02/13/2017 1046   ALKPHOS 69 12/23/2016 0951   AST 21 03/27/2017 1043   AST 26 12/23/2016 0951   ALT 18 03/27/2017 1043   ALT 18 02/13/2017 1046   ALT 16 12/23/2016 0951   BILITOT 0.8 03/27/2017 1043   BILITOT 0.96 12/23/2016 0951      Impression and Plan: Rachael Davenport is a very pleasant 72 yo African American female with chronic renal insufficiency and erythropoietin deficiency. Hgb today is 10.5 so we will proceed with Aranesp today as planned.  We will see her back in another 2 months for follow-up.  She will contact our office with any questions or concerns. We can certainly see her sooner if need be.   Rachael Peace, NP 3/4/201911:05 AM

## 2017-05-22 NOTE — Patient Instructions (Signed)

## 2017-06-01 ENCOUNTER — Ambulatory Visit (HOSPITAL_COMMUNITY)
Admission: RE | Admit: 2017-06-01 | Discharge: 2017-06-01 | Disposition: A | Discharge status: Home / Self Care | Payer: Medicare Other | Source: Ambulatory Visit | Attending: Pulmonary Disease | Admitting: Pulmonary Disease

## 2017-06-01 ENCOUNTER — Other Ambulatory Visit (HOSPITAL_COMMUNITY): Payer: Self-pay | Admitting: Pulmonary Disease

## 2017-06-01 DIAGNOSIS — R053 Chronic cough: Secondary | ICD-10-CM

## 2017-06-01 DIAGNOSIS — R05 Cough: Secondary | ICD-10-CM

## 2017-07-24 ENCOUNTER — Inpatient Hospital Stay: Payer: Medicare Other

## 2017-07-24 ENCOUNTER — Inpatient Hospital Stay: Payer: Medicare Other | Attending: Family | Admitting: Family

## 2017-07-24 ENCOUNTER — Encounter: Payer: Self-pay | Admitting: Family

## 2017-07-24 ENCOUNTER — Other Ambulatory Visit: Payer: Self-pay

## 2017-07-24 VITALS — BP 113/52 | HR 77 | Temp 98.1°F | Resp 18 | Wt 124.0 lb

## 2017-07-24 DIAGNOSIS — D631 Anemia in chronic kidney disease: Secondary | ICD-10-CM | POA: Insufficient documentation

## 2017-07-24 DIAGNOSIS — N189 Chronic kidney disease, unspecified: Secondary | ICD-10-CM | POA: Insufficient documentation

## 2017-07-24 LAB — CBC WITH DIFFERENTIAL (CANCER CENTER ONLY)
Basophils Absolute: 0 10*3/uL (ref 0.0–0.1)
Basophils Relative: 1 %
EOS ABS: 0.1 10*3/uL (ref 0.0–0.5)
EOS PCT: 3 %
HCT: 34.6 % — ABNORMAL LOW (ref 34.8–46.6)
Hemoglobin: 10.9 g/dL — ABNORMAL LOW (ref 11.6–15.9)
LYMPHS ABS: 1.2 10*3/uL (ref 0.9–3.3)
Lymphocytes Relative: 26 %
MCH: 31.1 pg (ref 26.0–34.0)
MCHC: 31.5 g/dL — AB (ref 32.0–36.0)
MCV: 98.9 fL (ref 81.0–101.0)
MONO ABS: 0.5 10*3/uL (ref 0.1–0.9)
MONOS PCT: 12 %
Neutro Abs: 2.7 10*3/uL (ref 1.5–6.5)
Neutrophils Relative %: 58 %
PLATELETS: 243 10*3/uL (ref 145–400)
RBC: 3.5 MIL/uL — ABNORMAL LOW (ref 3.70–5.32)
RDW: 13.7 % (ref 11.1–15.7)
WBC Count: 4.5 10*3/uL (ref 3.9–10.0)

## 2017-07-24 LAB — CMP (CANCER CENTER ONLY)
ALT: 21 U/L (ref 0–55)
AST: 28 U/L (ref 5–34)
Albumin: 4.1 g/dL (ref 3.5–5.0)
Alkaline Phosphatase: 34 U/L — ABNORMAL LOW (ref 40–150)
Anion gap: 5 (ref 3–11)
BUN: 19 mg/dL (ref 7–26)
CHLORIDE: 108 mmol/L (ref 98–109)
CO2: 25 mmol/L (ref 22–29)
Calcium: 9.9 mg/dL (ref 8.4–10.4)
Creatinine: 1.37 mg/dL — ABNORMAL HIGH (ref 0.60–1.10)
GFR, EST NON AFRICAN AMERICAN: 38 mL/min — AB (ref >60)
GFR, Est AFR Am: 43 mL/min — ABNORMAL LOW (ref >60)
GLUCOSE: 115 mg/dL (ref 70–140)
Potassium: 5.6 mmol/L — ABNORMAL HIGH (ref 3.5–5.1)
SODIUM: 138 mmol/L (ref 136–145)
Total Bilirubin: 0.6 mg/dL (ref 0.2–1.2)
Total Protein: 7.6 g/dL (ref 6.4–8.3)

## 2017-07-24 MED ORDER — DARBEPOETIN ALFA 300 MCG/0.6ML IJ SOSY
PREFILLED_SYRINGE | INTRAMUSCULAR | Status: AC
  Filled 2017-07-24: qty 0.6

## 2017-07-24 MED ORDER — DARBEPOETIN ALFA 300 MCG/0.6ML IJ SOSY
300.0000 ug | PREFILLED_SYRINGE | Freq: Once | INTRAMUSCULAR | Status: AC
  Administered 2017-07-24: 300 ug via SUBCUTANEOUS

## 2017-07-24 NOTE — Progress Notes (Signed)
Hematology and Oncology Follow Up Visit  Rachael Davenport [id number] 01-23-1946 72 y.o. 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 She is doing well and has no complaints at this time. She is excited to become a great grandmother for the 6th time.  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 This is her last week before summer break. Next fall she will only be part time.  She has had no issue with bleeding, no bruising or petechiae. Hgb is stable at 10.9.  No fever, chills, n/v, cough, rash, dizziness, SOB, chest pain, abdominal pain or changes in bowel or bladder habits.  She states that she "may have occasional palpitations" and will follow-up with her PCP if needed.  No lymphadenopathy noted on exam.  No has numbness and tingling in her right hands and puffiness in her left foot she attributes to arthritis. She has a good appetite and is staying hydrated. Her weight is stable.   ECOG Performance Status: 1 - Symptomatic but completely ambulatory  Medications:  Allergies as of 07/24/2017      Reactions   Diovan [valsartan] Other (See Comments)   Heart sped up   Penicillins Itching, Swelling   Has patient had a PCN reaction causing immediate rash, facial/tongue/throat swelling, SOB or lightheadedness with hypotension: Yes Has patient had a PCN reaction causing severe rash involving mucus membranes or skin necrosis: No Has patient had a PCN reaction that required hospitalization: No Has patient had a PCN reaction occurring within the last 10 years: No If all of the above answers are "NO", then may proceed with Cephalosporin use.      Medication List        Accurate as of 07/24/17 11:37 AM. Always use your most recent med list.          aspirin EC 81 MG tablet Take  81 mg by mouth daily.   Fenofibric Acid 135 MG Cpdr TK 1 C PO QD   fluticasone 50 MCG/ACT nasal spray Commonly known as:  FLONASE U 1 SPRAY IEN BID   olmesartan 20 MG tablet Commonly known as:  BENICAR Take 20 mg by mouth daily.   RESTASIS 0.05 % ophthalmic emulsion Generic drug:  cycloSPORINE INT 1 GTT IN OU BID UTD   SUMAtriptan 25 MG tablet Commonly known as:  IMITREX Take 25 mg by mouth once as needed for migraine (MAY REPEAT ONCE IN 2 HOURS IF NO RELIEF (MAX 2 TABLETS/24 HOURS)).   Vitamin D (Ergocalciferol) 50000 units Caps capsule Commonly known as:  DRISDOL Take 50,000 Units by mouth every 7 (seven) days.       Allergies:  Allergies  Allergen Reactions  . Diovan [Valsartan] Other (See Comments)    Heart sped up  . Penicillins Itching and Swelling    Has patient had a PCN reaction causing immediate rash, facial/tongue/throat swelling, SOB or lightheadedness with hypotension: Yes Has patient had a PCN reaction causing severe rash involving mucus membranes or skin necrosis: No Has patient had a PCN reaction that required hospitalization: No Has patient had a PCN reaction occurring within the last 10 years: No If all of the above answers are "NO", then may proceed with Cephalosporin use.     Past Medical History, Surgical history, Social history, and Family History were reviewed and updated.  Review of Systems: All other 10 point review of systems is negative.   Physical Exam:  vitals were not taken for this visit.   Wt Readings from Last 3 Encounters:  05/22/17 128 lb (58.1 kg)  03/27/17 126 lb (57.2 kg)  02/13/17 127 lb (57.6 kg)    Ocular: Sclerae unicteric, pupils equal, round and reactive to light Ear-nose-throat: Oropharynx clear, dentition fair Lymphatic: No cervical, supraclavicular or axillary adenopathy Lungs no rales or rhonchi, good excursion bilaterally Heart regular rate and rhythm, no murmur appreciated Abd soft, nontender, positive  bowel sounds, no liver or spleen tip palpated on exam, no fluid wave  MSK no focal spinal tenderness, no joint edema Neuro: non-focal, well-oriented, appropriate affect Breasts: Deferred   Lab Results  Component Value Date   WBC 4.5 07/24/2017   HGB 10.9 (L) 07/24/2017   HCT 34.6 (L) 07/24/2017   MCV 98.9 07/24/2017   PLT 243 07/24/2017   Lab Results  Component Value Date   FERRITIN 263 01/13/2017   IRON 84 01/13/2017   TIBC 286 01/13/2017   UIBC 202 01/13/2017   IRONPCTSAT 29 01/13/2017   Lab Results  Component Value Date   RBC 3.50 (L) 07/24/2017   No results found for: KPAFRELGTCHN, LAMBDASER, KAPLAMBRATIO No results found for: IGGSERUM, IGA, IGMSERUM No results found for: Kathrynn Ducking, MSPIKE, SPEI   Chemistry      Component Value Date/Time   NA 141 05/22/2017 1042   NA 145 02/13/2017 1046   NA 142 12/23/2016 0951   K 4.6 05/22/2017 1042   K 4.6 02/13/2017 1046   K 3.9 12/23/2016 0951   CL 107 05/22/2017 1042   CL 108 02/13/2017 1046   CO2 25 05/22/2017 1042   CO2 28 02/13/2017 1046   CO2 25 12/23/2016 0951   BUN 27 (H) 05/22/2017 1042   BUN 22 02/13/2017 1046   BUN 15.0 12/23/2016 0951   CREATININE 1.43 (H) 05/22/2017 1042   CREATININE 1.1 02/13/2017 1046   CREATININE 0.9 12/23/2016 0951      Component Value Date/Time   CALCIUM 10.1 05/22/2017 1042   CALCIUM 9.7 02/13/2017 1046   CALCIUM 9.5 12/23/2016 0951   ALKPHOS 37 (L) 05/22/2017 1042   ALKPHOS 60 02/13/2017 1046   ALKPHOS 69 12/23/2016 0951   AST 47 (H) 05/22/2017 1042   AST 26 12/23/2016 0951   ALT 44 05/22/2017 1042   ALT 18 02/13/2017 1046   ALT 16 12/23/2016 0951   BILITOT 0.8 05/22/2017 1042   BILITOT 0.96 12/23/2016 0951      Impression and Plan: Rachael Davenport is a very pleasant 72 yo African American female with anemia of chronic renal insufficiency and erythropoietin deficiency.  She will get her Aranesp today for Hgb of 10.9.  She will  follow-up with our office in another 2 months.  She will contact our office with any questions or concerns. We can certainly see her sooner if need be.   Laverna Peace, NP 5/6/201911:37 AM

## 2017-07-24 NOTE — Patient Instructions (Signed)

## 2017-07-25 ENCOUNTER — Telehealth: Payer: Self-pay | Admitting: *Deleted

## 2017-07-25 NOTE — Telephone Encounter (Signed)
Patient had an aranesp injection in the office yesterday and states that since, she has had muscle cramps and aches. She wants to know if she can be prescribed something for the discomfort.   Reviewed symptoms with Laverna Peace NP. She would like patient to try ibuprofen or tylenol per package instruction. She is to take food with any ibuprofen. Patient is aware of instruction.

## 2017-09-25 ENCOUNTER — Inpatient Hospital Stay: Payer: Medicare Other | Attending: Family | Admitting: Family

## 2017-09-25 ENCOUNTER — Inpatient Hospital Stay: Payer: Medicare Other

## 2017-09-25 ENCOUNTER — Encounter: Payer: Self-pay | Admitting: Family

## 2017-09-25 ENCOUNTER — Other Ambulatory Visit: Payer: Self-pay

## 2017-09-25 VITALS — BP 121/48 | HR 77 | Temp 98.6°F | Resp 16 | Wt 124.0 lb

## 2017-09-25 DIAGNOSIS — D631 Anemia in chronic kidney disease: Secondary | ICD-10-CM

## 2017-09-25 DIAGNOSIS — I129 Hypertensive chronic kidney disease with stage 1 through stage 4 chronic kidney disease, or unspecified chronic kidney disease: Secondary | ICD-10-CM | POA: Diagnosis not present

## 2017-09-25 DIAGNOSIS — Z79899 Other long term (current) drug therapy: Secondary | ICD-10-CM | POA: Diagnosis not present

## 2017-09-25 DIAGNOSIS — N189 Chronic kidney disease, unspecified: Secondary | ICD-10-CM | POA: Insufficient documentation

## 2017-09-25 LAB — CBC WITH DIFFERENTIAL (CANCER CENTER ONLY)
BASOS ABS: 0 10*3/uL (ref 0.0–0.1)
BASOS PCT: 1 %
EOS ABS: 0.1 10*3/uL (ref 0.0–0.5)
Eosinophils Relative: 2 %
HEMATOCRIT: 30.8 % — AB (ref 34.8–46.6)
Hemoglobin: 9.7 g/dL — ABNORMAL LOW (ref 11.6–15.9)
Lymphocytes Relative: 29 %
Lymphs Abs: 1.3 10*3/uL (ref 0.9–3.3)
MCH: 31.7 pg (ref 26.0–34.0)
MCHC: 31.5 g/dL — AB (ref 32.0–36.0)
MCV: 100.7 fL (ref 81.0–101.0)
MONOS PCT: 9 %
Monocytes Absolute: 0.4 10*3/uL (ref 0.1–0.9)
NEUTROS ABS: 2.7 10*3/uL (ref 1.5–6.5)
NEUTROS PCT: 59 %
Platelet Count: 225 10*3/uL (ref 145–400)
RBC: 3.06 MIL/uL — AB (ref 3.70–5.32)
RDW: 13.4 % (ref 11.1–15.7)
WBC Count: 4.5 10*3/uL (ref 3.9–10.0)

## 2017-09-25 LAB — CMP (CANCER CENTER ONLY)
ALBUMIN: 3.5 g/dL (ref 3.5–5.0)
ALK PHOS: 29 U/L (ref 26–84)
ALT: 29 U/L (ref 10–47)
AST: 33 U/L (ref 11–38)
Anion gap: 1 — ABNORMAL LOW (ref 5–15)
BILIRUBIN TOTAL: 0.7 mg/dL (ref 0.2–1.6)
BUN: 17 mg/dL (ref 7–22)
CALCIUM: 9.6 mg/dL (ref 8.0–10.3)
CHLORIDE: 107 mmol/L (ref 98–108)
CO2: 30 mmol/L (ref 18–33)
CREATININE: 0.9 mg/dL (ref 0.60–1.20)
Glucose, Bld: 148 mg/dL — ABNORMAL HIGH (ref 73–118)
Potassium: 4.4 mmol/L (ref 3.3–4.7)
Sodium: 138 mmol/L (ref 128–145)
Total Protein: 6.8 g/dL (ref 6.4–8.1)

## 2017-09-25 MED ORDER — DARBEPOETIN ALFA 300 MCG/0.6ML IJ SOSY
PREFILLED_SYRINGE | INTRAMUSCULAR | Status: AC
  Filled 2017-09-25: qty 0.6

## 2017-09-25 MED ORDER — DARBEPOETIN ALFA 300 MCG/0.6ML IJ SOSY
300.0000 ug | PREFILLED_SYRINGE | Freq: Once | INTRAMUSCULAR | Status: AC
  Administered 2017-09-25: 300 ug via SUBCUTANEOUS

## 2017-09-25 NOTE — Progress Notes (Signed)
Hematology and Oncology Follow Up Visit  Rachael Davenport [id number] 1945-09-26 72 y.o. 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 Hgb today is 9.7. She is doing well and has no complaints at this time.  No episodes of bleeding, no bruising or petechiae.  She denies fatigue. No fever, chills, n/v, cough, rash, dizziness, SOB, chest pain, palpitations, abdominal pain or changes in bowel or bladder habits.  No tenderness, numbness or tingling in her extremities at this time. She has puffiness in her left ankle that comes and goes.  No lymphadenopathy noted on exam.  She has maintained a good appetite and is staying well hydrated. Her weight is stable.   ECOG Performance Status: 1 - Symptomatic but completely ambulatory  Medications:  Allergies as of 09/25/2017      Reactions   Penicillins Itching, Swelling   Has patient had a PCN reaction causing immediate rash, facial/tongue/throat swelling, SOB or lightheadedness with hypotension: Yes Has patient had a PCN reaction causing severe rash involving mucus membranes or skin necrosis: No Has patient had a PCN reaction that required hospitalization: No Has patient had a PCN reaction occurring within the last 10 years: No If all of the above answers are "NO", then may proceed with Cephalosporin use.   Valsartan Other (See Comments), Itching   Heart sped up      Medication List        Accurate as of 09/25/17 12:56 PM. Always use your most recent med list.          aspirin EC 81 MG tablet Take 81 mg by mouth daily.   Fenofibric Acid 135 MG Cpdr TK 1 C PO QD   fluticasone 50 MCG/ACT nasal spray Commonly known as:  FLONASE U 1 SPRAY IEN BID   olmesartan 20 MG tablet Commonly known as:  BENICAR Take 20 mg by mouth daily.     RESTASIS 0.05 % ophthalmic emulsion Generic drug:  cycloSPORINE INT 1 GTT IN OU BID UTD   SUMAtriptan 25 MG tablet Commonly known as:  IMITREX Take 25 mg by mouth once as needed for migraine (MAY REPEAT ONCE IN 2 HOURS IF NO RELIEF (MAX 2 TABLETS/24 HOURS)).   Vitamin D (Ergocalciferol) 50000 units Caps capsule Commonly known as:  DRISDOL Take 50,000 Units by mouth every 7 (seven) days.       Allergies:  Allergies  Allergen Reactions  . Penicillins Itching and Swelling    Has patient had a PCN reaction causing immediate rash, facial/tongue/throat swelling, SOB or lightheadedness with hypotension: Yes Has patient had a PCN reaction causing severe rash involving mucus membranes or skin necrosis: No Has patient had a PCN reaction that required hospitalization: No Has patient had a PCN reaction occurring within the last 10 years: No If all of the above answers are "NO", then may proceed with Cephalosporin use.   . Valsartan Other (See Comments) and Itching    Heart sped up    Past Medical History, Surgical history, Social history, and Family History were reviewed and updated.  Review of Systems: All other 10 point review of systems is negative.   Physical Exam:  weight is 124 lb (56.2 kg). Her oral temperature is 98.6 F (37 C). Her blood pressure is 121/48 (abnormal) and her pulse is 77. Her respiration is 16  and oxygen saturation is 100%.   Wt Readings from Last 3 Encounters:  09/25/17 124 lb (56.2 kg)  07/24/17 124 lb (56.2 kg)  05/22/17 128 lb (58.1 kg)    Ocular: Sclerae unicteric, pupils equal, round and reactive to light Ear-nose-throat: Oropharynx clear, dentition fair Lymphatic: No cervical, supraclavicular or axillary adenopathy Lungs no rales or rhonchi, good excursion bilaterally Heart regular rate and rhythm, no murmur appreciated Abd soft, nontender, positive bowel sounds, no liver or spleen tip palpated on exam, no fluid wave  MSK no focal spinal  tenderness, no joint edema Neuro: non-focal, well-oriented, appropriate affect Breasts: Deferred   Lab Results  Component Value Date   WBC 4.5 09/25/2017   HGB 9.7 (L) 09/25/2017   HCT 30.8 (L) 09/25/2017   MCV 100.7 09/25/2017   PLT 225 09/25/2017   Lab Results  Component Value Date   FERRITIN 263 01/13/2017   IRON 84 01/13/2017   TIBC 286 01/13/2017   UIBC 202 01/13/2017   IRONPCTSAT 29 01/13/2017   Lab Results  Component Value Date   RBC 3.06 (L) 09/25/2017   No results found for: KPAFRELGTCHN, LAMBDASER, KAPLAMBRATIO No results found for: IGGSERUM, IGA, IGMSERUM No results found for: Kathrynn Ducking, MSPIKE, SPEI   Chemistry      Component Value Date/Time   NA 138 09/25/2017 1114   NA 145 02/13/2017 1046   NA 142 12/23/2016 0951   K 4.4 09/25/2017 1114   K 4.6 02/13/2017 1046   K 3.9 12/23/2016 0951   CL 107 09/25/2017 1114   CL 108 02/13/2017 1046   CO2 30 09/25/2017 1114   CO2 28 02/13/2017 1046   CO2 25 12/23/2016 0951   BUN 17 09/25/2017 1114   BUN 22 02/13/2017 1046   BUN 15.0 12/23/2016 0951   CREATININE 0.90 09/25/2017 1114   CREATININE 1.1 02/13/2017 1046   CREATININE 0.9 12/23/2016 0951      Component Value Date/Time   CALCIUM 9.6 09/25/2017 1114   CALCIUM 9.7 02/13/2017 1046   CALCIUM 9.5 12/23/2016 0951   ALKPHOS 29 09/25/2017 1114   ALKPHOS 60 02/13/2017 1046   ALKPHOS 69 12/23/2016 0951   AST 33 09/25/2017 1114   AST 26 12/23/2016 0951   ALT 29 09/25/2017 1114   ALT 18 02/13/2017 1046   ALT 16 12/23/2016 0951   BILITOT 0.7 09/25/2017 1114   BILITOT 0.96 12/23/2016 0951      Impression and Plan: Rachael Davenport is a very pleasant 72 yo African American female with anemia of chronic renal insufficiency and erythropoietin deficiency.  Hgb is 9.7 so she was given Aranesp today.  We will plan to see her back in another 8 weeks for follow-up.  She will contact our office with any questions or  concerns. We can certainly see her sooner if need be.   Laverna Peace, NP 7/8/201912:56 PM

## 2017-09-29 ENCOUNTER — Other Ambulatory Visit: Payer: Self-pay | Admitting: Obstetrics and Gynecology

## 2017-09-29 DIAGNOSIS — Z1231 Encounter for screening mammogram for malignant neoplasm of breast: Secondary | ICD-10-CM

## 2017-10-23 ENCOUNTER — Ambulatory Visit
Admission: RE | Admit: 2017-10-23 | Discharge: 2017-10-23 | Disposition: A | Discharge status: Home / Self Care | Payer: Medicare Other | Source: Ambulatory Visit | Attending: Obstetrics and Gynecology | Admitting: Obstetrics and Gynecology

## 2017-10-23 DIAGNOSIS — Z1231 Encounter for screening mammogram for malignant neoplasm of breast: Secondary | ICD-10-CM

## 2017-11-21 ENCOUNTER — Ambulatory Visit: Payer: Medicare Other

## 2017-11-21 ENCOUNTER — Ambulatory Visit: Payer: Medicare Other | Admitting: Family

## 2017-11-21 ENCOUNTER — Other Ambulatory Visit: Payer: Medicare Other

## 2017-11-24 ENCOUNTER — Other Ambulatory Visit: Payer: Self-pay

## 2017-11-24 ENCOUNTER — Inpatient Hospital Stay: Payer: Medicare Other | Attending: Family

## 2017-11-24 ENCOUNTER — Inpatient Hospital Stay: Payer: Medicare Other

## 2017-11-24 ENCOUNTER — Encounter: Payer: Self-pay | Admitting: Family

## 2017-11-24 ENCOUNTER — Inpatient Hospital Stay (HOSPITAL_BASED_OUTPATIENT_CLINIC_OR_DEPARTMENT_OTHER): Payer: Medicare Other | Admitting: Family

## 2017-11-24 VITALS — BP 123/59 | HR 72 | Temp 98.2°F | Resp 18 | Wt 123.0 lb

## 2017-11-24 DIAGNOSIS — N189 Chronic kidney disease, unspecified: Secondary | ICD-10-CM

## 2017-11-24 DIAGNOSIS — D6489 Other specified anemias: Secondary | ICD-10-CM | POA: Insufficient documentation

## 2017-11-24 DIAGNOSIS — D508 Other iron deficiency anemias: Secondary | ICD-10-CM

## 2017-11-24 DIAGNOSIS — Z7982 Long term (current) use of aspirin: Secondary | ICD-10-CM | POA: Insufficient documentation

## 2017-11-24 DIAGNOSIS — K5909 Other constipation: Secondary | ICD-10-CM | POA: Diagnosis not present

## 2017-11-24 DIAGNOSIS — Z79899 Other long term (current) drug therapy: Secondary | ICD-10-CM

## 2017-11-24 DIAGNOSIS — D631 Anemia in chronic kidney disease: Secondary | ICD-10-CM

## 2017-11-24 LAB — CBC WITH DIFFERENTIAL (CANCER CENTER ONLY)
BASOS PCT: 0 %
Basophils Absolute: 0 10*3/uL (ref 0.0–0.1)
EOS ABS: 0.1 10*3/uL (ref 0.0–0.5)
Eosinophils Relative: 3 %
HEMATOCRIT: 30.2 % — AB (ref 34.8–46.6)
HEMOGLOBIN: 9.4 g/dL — AB (ref 11.6–15.9)
Lymphocytes Relative: 27 %
Lymphs Abs: 1.3 10*3/uL (ref 0.9–3.3)
MCH: 31.2 pg (ref 26.0–34.0)
MCHC: 31.1 g/dL — ABNORMAL LOW (ref 32.0–36.0)
MCV: 100.3 fL (ref 81.0–101.0)
MONOS PCT: 9 %
Monocytes Absolute: 0.4 10*3/uL (ref 0.1–0.9)
NEUTROS ABS: 2.9 10*3/uL (ref 1.5–6.5)
NEUTROS PCT: 61 %
Platelet Count: 221 10*3/uL (ref 145–400)
RBC: 3.01 MIL/uL — AB (ref 3.70–5.32)
RDW: 12.9 % (ref 11.1–15.7)
WBC: 4.8 10*3/uL (ref 3.9–10.0)

## 2017-11-24 LAB — CMP (CANCER CENTER ONLY)
ALBUMIN: 3.9 g/dL (ref 3.5–5.0)
ALK PHOS: 29 U/L — AB (ref 38–126)
ALT: 20 U/L (ref 0–44)
AST: 26 U/L (ref 15–41)
Anion gap: 13 (ref 5–15)
BILIRUBIN TOTAL: 0.5 mg/dL (ref 0.3–1.2)
BUN: 15 mg/dL (ref 8–23)
CALCIUM: 9.5 mg/dL (ref 8.9–10.3)
CO2: 21 mmol/L — AB (ref 22–32)
CREATININE: 1.18 mg/dL — AB (ref 0.44–1.00)
Chloride: 107 mmol/L (ref 98–111)
GFR, Est AFR Am: 52 mL/min — ABNORMAL LOW (ref >60)
GFR, Estimated: 45 mL/min — ABNORMAL LOW (ref >60)
GLUCOSE: 108 mg/dL — AB (ref 70–99)
Potassium: 4.1 mmol/L (ref 3.5–5.1)
SODIUM: 141 mmol/L (ref 135–145)
TOTAL PROTEIN: 7 g/dL (ref 6.5–8.1)

## 2017-11-24 LAB — RETICULOCYTES
RBC.: 3.03 MIL/uL — AB (ref 3.70–5.45)
RETIC CT PCT: 1.2 % (ref 0.7–2.1)
Retic Count, Absolute: 36.4 10*3/uL (ref 33.7–90.7)

## 2017-11-24 MED ORDER — DARBEPOETIN ALFA 300 MCG/0.6ML IJ SOSY
PREFILLED_SYRINGE | INTRAMUSCULAR | Status: AC
  Filled 2017-11-24: qty 0.6

## 2017-11-24 MED ORDER — DARBEPOETIN ALFA 300 MCG/0.6ML IJ SOSY
300.0000 ug | PREFILLED_SYRINGE | Freq: Once | INTRAMUSCULAR | Status: AC
  Administered 2017-11-24: 300 ug via SUBCUTANEOUS

## 2017-11-24 NOTE — Progress Notes (Signed)
Hematology and Oncology Follow Up Visit  Rachael Davenport [id number] 1946/01/05 72 y.o. 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 She is doing well but has noted some fatigue, chills and palpitations. Hgb is 9.4, MCV 100.  She denies chest pain. No fever, n.v, cough, rash, dizziness, SOB, abdominal pain or changes in bowel or bladder habits.  She has history of chronic constipation and uses smooth move tea and miralax as needed.  Her PCP office closed and she needs a new provider. I gave her the contact information for [facility] downstairs and she plans to check with her insurance and confirm that they are in her network before making a new patient appointment.  No episodes of bleeding, no bruising or petechiae.  No lymphadenopathy noted on exam.  She has maintained a good appetite and is staying well hydrated. Her weight is stable.   ECOG Performance Status: 1 - Symptomatic but completely ambulatory  Medications:  Allergies as of 11/24/2017      Reactions   Penicillins Itching, Swelling   Has patient had a PCN reaction causing immediate rash, facial/tongue/throat swelling, SOB or lightheadedness with hypotension: Yes Has patient had a PCN reaction causing severe rash involving mucus membranes or skin necrosis: No Has patient had a PCN reaction that required hospitalization: No Has patient had a PCN reaction occurring within the last 10 years: No If all of the above answers are "NO", then may proceed with Cephalosporin use.   Valsartan Other (See Comments), Itching   Heart sped up      Medication List        Accurate as of 11/24/17  1:16 PM. Always use your most recent med list.          aspirin EC 81 MG tablet Take 81 mg by mouth daily.   Fenofibric Acid 135 MG  Cpdr TK 1 C PO QD   Fenofibric Acid 35 MG Tabs Take 135 mg by mouth.   fluticasone 50 MCG/ACT nasal spray Commonly known as:  FLONASE U 1 SPRAY IEN BID   IMVEXXY STARTER PACK 4 MCG Inst Generic drug:  Estradiol Place 4 mcg vaginally.   olmesartan 20 MG tablet Commonly known as:  BENICAR Take 20 mg by mouth daily.   RESTASIS 0.05 % ophthalmic emulsion Generic drug:  cycloSPORINE INT 1 GTT IN OU BID UTD   SUMAtriptan 25 MG tablet Commonly known as:  IMITREX Take 25 mg by mouth once as needed for migraine (MAY REPEAT ONCE IN 2 HOURS IF NO RELIEF (MAX 2 TABLETS/24 HOURS)).   Vitamin D (Ergocalciferol) 50000 units Caps capsule Commonly known as:  DRISDOL Take 50,000 Units by mouth every 7 (seven) days.   ergocalciferol 50000 units capsule Commonly known as:  VITAMIN D2 Take 50,000 Units by mouth once a week.       Allergies:  Allergies  Allergen Reactions  . Penicillins Itching and Swelling    Has patient had a PCN reaction causing immediate rash, facial/tongue/throat swelling, SOB or lightheadedness with hypotension: Yes Has patient had a PCN reaction causing severe rash involving mucus membranes or skin necrosis: No Has patient had a PCN reaction that required hospitalization: No Has patient had a PCN reaction occurring within the last 10 years: No If all of the above answers are "NO", then may  proceed with Cephalosporin use.   . Valsartan Other (See Comments) and Itching    Heart sped up    Past Medical History, Surgical history, Social history, and Family History were reviewed and updated.  Review of Systems: All other 10 point review of systems is negative.   Physical Exam:  weight is 123 lb (55.8 kg). Her oral temperature is 98.2 F (36.8 C). Her blood pressure is 123/59 (abnormal) and her pulse is 72. Her respiration is 18 and oxygen saturation is 100%.   Wt Readings from Last 3 Encounters:  11/24/17 123 lb (55.8 kg)  09/25/17 124 lb (56.2 kg)    07/24/17 124 lb (56.2 kg)    Ocular: Sclerae unicteric, pupils equal, round and reactive to light Ear-nose-throat: Oropharynx clear, dentition fair Lymphatic: No cervical, supraclavicular or axillary adenopathy Lungs no rales or rhonchi, good excursion bilaterally Heart regular rate and rhythm, no murmur appreciated Abd soft, nontender, positive bowel sounds, no liver or spleen tip palpated on exam, no fluid wave  MSK no focal spinal tenderness, no joint edema Neuro: non-focal, well-oriented, appropriate affect Breasts: Deferred   Lab Results  Component Value Date   WBC 4.8 11/24/2017   HGB 9.4 (L) 11/24/2017   HCT 30.2 (L) 11/24/2017   MCV 100.3 11/24/2017   PLT 221 11/24/2017   Lab Results  Component Value Date   FERRITIN 263 01/13/2017   IRON 84 01/13/2017   TIBC 286 01/13/2017   UIBC 202 01/13/2017   IRONPCTSAT 29 01/13/2017   Lab Results  Component Value Date   RBC 3.01 (L) 11/24/2017   No results found for: KPAFRELGTCHN, LAMBDASER, KAPLAMBRATIO No results found for: IGGSERUM, IGA, IGMSERUM No results found for: Kathrynn Ducking, MSPIKE, SPEI   Chemistry      Component Value Date/Time   NA 138 09/25/2017 1114   NA 145 02/13/2017 1046   NA 142 12/23/2016 0951   K 4.4 09/25/2017 1114   K 4.6 02/13/2017 1046   K 3.9 12/23/2016 0951   CL 107 09/25/2017 1114   CL 108 02/13/2017 1046   CO2 30 09/25/2017 1114   CO2 28 02/13/2017 1046   CO2 25 12/23/2016 0951   BUN 17 09/25/2017 1114   BUN 22 02/13/2017 1046   BUN 15.0 12/23/2016 0951   CREATININE 0.90 09/25/2017 1114   CREATININE 1.1 02/13/2017 1046   CREATININE 0.9 12/23/2016 0951      Component Value Date/Time   CALCIUM 9.6 09/25/2017 1114   CALCIUM 9.7 02/13/2017 1046   CALCIUM 9.5 12/23/2016 0951   ALKPHOS 29 09/25/2017 1114   ALKPHOS 60 02/13/2017 1046   ALKPHOS 69 12/23/2016 0951   AST 33 09/25/2017 1114   AST 26 12/23/2016 0951   ALT 29 09/25/2017 1114    ALT 18 02/13/2017 1046   ALT 16 12/23/2016 0951   BILITOT 0.7 09/25/2017 1114   BILITOT 0.96 12/23/2016 0951      Impression and Plan: Rachael Davenport is a very pleasant 72 yo African American female with anemia of chronic renal insufficiency and erythropoietin deficiency. She is symptomatic as mentioned above.  Hgb is 9.4 so she was given Aranesp today.  We also added iron studies to her lab work and will see what these show.   We will plan to see her back in another 8 weeks for follow-up.  She will contact our office with any questions or concerns. We can certainly see her sooner if need be.   Laverna Peace,  NP 9/6/20191:16 PM

## 2017-11-24 NOTE — Patient Instructions (Signed)

## 2017-11-28 ENCOUNTER — Other Ambulatory Visit: Payer: Self-pay | Admitting: *Deleted

## 2017-11-28 DIAGNOSIS — D631 Anemia in chronic kidney disease: Secondary | ICD-10-CM

## 2017-11-29 ENCOUNTER — Inpatient Hospital Stay: Payer: Medicare Other

## 2018-01-30 ENCOUNTER — Encounter: Payer: Self-pay | Admitting: Family

## 2018-01-30 ENCOUNTER — Inpatient Hospital Stay: Payer: Medicare Other

## 2018-01-30 ENCOUNTER — Inpatient Hospital Stay: Payer: Medicare Other | Attending: Family | Admitting: Family

## 2018-01-30 VITALS — BP 111/55 | HR 84 | Temp 98.1°F | Resp 17 | Wt 124.0 lb

## 2018-01-30 DIAGNOSIS — N189 Chronic kidney disease, unspecified: Secondary | ICD-10-CM

## 2018-01-30 DIAGNOSIS — D631 Anemia in chronic kidney disease: Secondary | ICD-10-CM | POA: Insufficient documentation

## 2018-01-30 DIAGNOSIS — D6489 Other specified anemias: Secondary | ICD-10-CM | POA: Insufficient documentation

## 2018-01-30 DIAGNOSIS — Z79899 Other long term (current) drug therapy: Secondary | ICD-10-CM | POA: Diagnosis not present

## 2018-01-30 DIAGNOSIS — Z7982 Long term (current) use of aspirin: Secondary | ICD-10-CM | POA: Insufficient documentation

## 2018-01-30 DIAGNOSIS — D508 Other iron deficiency anemias: Secondary | ICD-10-CM

## 2018-01-30 DIAGNOSIS — R5383 Other fatigue: Secondary | ICD-10-CM | POA: Diagnosis not present

## 2018-01-30 DIAGNOSIS — K649 Unspecified hemorrhoids: Secondary | ICD-10-CM | POA: Diagnosis not present

## 2018-01-30 LAB — CBC WITH DIFFERENTIAL (CANCER CENTER ONLY)
ABS IMMATURE GRANULOCYTES: 0.01 10*3/uL (ref 0.00–0.07)
BASOS ABS: 0 10*3/uL (ref 0.0–0.1)
BASOS PCT: 1 %
Eosinophils Absolute: 0.1 10*3/uL (ref 0.0–0.5)
Eosinophils Relative: 2 %
HEMATOCRIT: 32.9 % — AB (ref 36.0–46.0)
HEMOGLOBIN: 9.9 g/dL — AB (ref 12.0–15.0)
IMMATURE GRANULOCYTES: 0 %
Lymphocytes Relative: 26 %
Lymphs Abs: 1.2 10*3/uL (ref 0.7–4.0)
MCH: 30.8 pg (ref 26.0–34.0)
MCHC: 30.1 g/dL (ref 30.0–36.0)
MCV: 102.5 fL — AB (ref 80.0–100.0)
MONO ABS: 0.5 10*3/uL (ref 0.1–1.0)
Monocytes Relative: 10 %
NEUTROS ABS: 3 10*3/uL (ref 1.7–7.7)
Neutrophils Relative %: 61 %
Platelet Count: 273 10*3/uL (ref 150–400)
RBC: 3.21 MIL/uL — AB (ref 3.87–5.11)
RDW: 13.1 % (ref 11.5–15.5)
WBC: 4.8 10*3/uL (ref 4.0–10.5)
nRBC: 0 % (ref 0.0–0.2)

## 2018-01-30 LAB — CMP (CANCER CENTER ONLY)
ALBUMIN: 4.2 g/dL (ref 3.5–5.0)
ALT: 19 U/L (ref 0–44)
AST: 29 U/L (ref 15–41)
Alkaline Phosphatase: 34 U/L — ABNORMAL LOW (ref 38–126)
Anion gap: 8 (ref 5–15)
BILIRUBIN TOTAL: 0.6 mg/dL (ref 0.3–1.2)
BUN: 35 mg/dL — AB (ref 8–23)
CO2: 24 mmol/L (ref 22–32)
CREATININE: 1.7 mg/dL — AB (ref 0.44–1.00)
Calcium: 10.1 mg/dL (ref 8.9–10.3)
Chloride: 107 mmol/L (ref 98–111)
GFR, EST NON AFRICAN AMERICAN: 29 mL/min — AB (ref >60)
GFR, Est AFR Am: 33 mL/min — ABNORMAL LOW (ref >60)
GLUCOSE: 107 mg/dL — AB (ref 70–99)
POTASSIUM: 5.2 mmol/L — AB (ref 3.5–5.1)
Sodium: 139 mmol/L (ref 135–145)
TOTAL PROTEIN: 7.9 g/dL (ref 6.5–8.1)

## 2018-01-30 MED ORDER — DARBEPOETIN ALFA 300 MCG/0.6ML IJ SOSY
PREFILLED_SYRINGE | INTRAMUSCULAR | Status: AC
  Filled 2018-01-30: qty 0.6

## 2018-01-30 MED ORDER — DARBEPOETIN ALFA 300 MCG/0.6ML IJ SOSY
300.0000 ug | PREFILLED_SYRINGE | Freq: Once | INTRAMUSCULAR | Status: AC
  Administered 2018-01-30: 300 ug via SUBCUTANEOUS

## 2018-01-30 NOTE — Progress Notes (Signed)
Hematology and Oncology Follow Up Visit  Rachael Davenport [id number] [birth year] 72 y.o. 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 She is doing well but states she has some occasional fatigue.  She has a hemorrhoid bothering her now. She has tried creams and soaking in a warm bath but is still sore. She plans to see her PCP if this persists and also has a follow-up with GI in December. She has had no bleeding with this thankfully.  No bruising or petechiae.  No fever, chills, n/v, cough, rash, dizziness, SOB, chest pain, palpitations, abdominal pain or changes in bowel or bladder habits.  No swelling, tenderness, numbness or tingling in her extremities.  No lymphadenopathy noted on exam.  He has maintained a good appetite and is staying well hydrated. She drinks 1 Boost a day. Her weight is stable.  She stays active in her church.   ECOG Performance Status: 1 - Symptomatic but completely ambulatory  Medications:  Allergies as of 01/30/2018      Reactions   Penicillins Itching, Swelling   Has patient had a PCN reaction causing immediate rash, facial/tongue/throat swelling, SOB or lightheadedness with hypotension: Yes Has patient had a PCN reaction causing severe rash involving mucus membranes or skin necrosis: No Has patient had a PCN reaction that required hospitalization: No Has patient had a PCN reaction occurring within the last 10 years: No If all of the above answers are "NO", then may proceed with Cephalosporin use.   Valsartan Other (See Comments), Itching   Heart sped up      Medication List        Accurate as of 01/30/18 11:24 AM. Always use your most recent med list.          aspirin EC 81 MG tablet Take 81 mg by mouth daily.   Cyanocobalamin 100 MCG  Lozg Inject as directed.   Fenofibric Acid 135 MG Cpdr fenofibric acid (choline) 135 mg capsule,delayed release   fluticasone 50 MCG/ACT nasal spray Commonly known as:  FLONASE U 1 SPRAY IEN BID   hydrogen peroxide 3 % external solution Apply topically.   IMVEXXY MAINTENANCE PACK 4 MCG Inst Generic drug:  Estradiol Imvexxy Maintenance Pack 4 mcg vaginal insert  Insert 1 vaginal insert twice a week by vaginal route.   olmesartan 20 MG tablet Commonly known as:  BENICAR Take 20 mg by mouth daily.   RESTASIS 0.05 % ophthalmic emulsion Generic drug:  cycloSPORINE INT 1 GTT IN OU BID UTD   SUMAtriptan 25 MG tablet Commonly known as:  IMITREX Take 25 mg by mouth once as needed for migraine (MAY REPEAT ONCE IN 2 HOURS IF NO RELIEF (MAX 2 TABLETS/24 HOURS)).   Vitamin D (Ergocalciferol) 1.25 MG (50000 UT) Caps capsule Commonly known as:  DRISDOL Take 50,000 Units by mouth every 7 (seven) days.   ergocalciferol 1.25 MG (50000 UT) capsule Commonly known as:  VITAMIN D2 Take 50,000 Units by mouth once a week.       Allergies:  Allergies  Allergen Reactions  . Penicillins Itching and Swelling    Has patient had a PCN reaction causing immediate rash, facial/tongue/throat swelling, SOB or lightheadedness with hypotension: Yes Has patient had a PCN reaction causing severe rash involving mucus membranes or skin necrosis: No Has patient had a PCN reaction  that required hospitalization: No Has patient had a PCN reaction occurring within the last 10 years: No If all of the above answers are "NO", then may proceed with Cephalosporin use.   . Valsartan Other (See Comments) and Itching    Heart sped up    Past Medical History, Surgical history, Social history, and Family History were reviewed and updated.  Review of Systems: All other 10 point review of systems is negative.   Physical Exam:  weight is 124 lb (56.2 kg). Her oral temperature is 98.1 F (36.7 C). Her blood  pressure is 111/55 (abnormal) and her pulse is 84. Her respiration is 17 and oxygen saturation is 96%.   Wt Readings from Last 3 Encounters:  01/30/18 124 lb (56.2 kg)  11/24/17 123 lb (55.8 kg)  09/25/17 124 lb (56.2 kg)    Ocular: Sclerae unicteric, pupils equal, round and reactive to light Ear-nose-throat: Oropharynx clear, dentition fair Lymphatic: No cervical, supraclavicular or axillary adenopathy Lungs no rales or rhonchi, good excursion bilaterally Heart regular rate and rhythm, no murmur appreciated Abd soft, nontender, positive bowel sounds, no liver or spleen tip palpated on exam, no fluid wave  MSK no focal spinal tenderness, no joint edema Neuro: non-focal, well-oriented, appropriate affect Breasts: Deferred   Lab Results  Component Value Date   WBC 4.8 01/30/2018   HGB 9.9 (L) 01/30/2018   HCT 32.9 (L) 01/30/2018   MCV 102.5 (H) 01/30/2018   PLT 273 01/30/2018   Lab Results  Component Value Date   FERRITIN 263 01/13/2017   IRON 84 01/13/2017   TIBC 286 01/13/2017   UIBC 202 01/13/2017   IRONPCTSAT 29 01/13/2017   Lab Results  Component Value Date   RETICCTPCT 1.2 11/24/2017   RBC 3.21 (L) 01/30/2018   No results found for: KPAFRELGTCHN, LAMBDASER, KAPLAMBRATIO No results found for: IGGSERUM, IGA, IGMSERUM No results found for: Kathrynn Ducking, MSPIKE, SPEI   Chemistry      Component Value Date/Time   NA 141 11/24/2017 1055   NA 145 02/13/2017 1046   NA 142 12/23/2016 0951   K 4.1 11/24/2017 1055   K 4.6 02/13/2017 1046   K 3.9 12/23/2016 0951   CL 107 11/24/2017 1055   CL 108 02/13/2017 1046   CO2 21 (L) 11/24/2017 1055   CO2 28 02/13/2017 1046   CO2 25 12/23/2016 0951   BUN 15 11/24/2017 1055   BUN 22 02/13/2017 1046   BUN 15.0 12/23/2016 0951   CREATININE 1.18 (H) 11/24/2017 1055   CREATININE 1.1 02/13/2017 1046   CREATININE 0.9 12/23/2016 0951      Component Value Date/Time   CALCIUM 9.5  11/24/2017 1055   CALCIUM 9.7 02/13/2017 1046   CALCIUM 9.5 12/23/2016 0951   ALKPHOS 29 (L) 11/24/2017 1055   ALKPHOS 60 02/13/2017 1046   ALKPHOS 69 12/23/2016 0951   AST 26 11/24/2017 1055   AST 26 12/23/2016 0951   ALT 20 11/24/2017 1055   ALT 18 02/13/2017 1046   ALT 16 12/23/2016 0951   BILITOT 0.5 11/24/2017 1055   BILITOT 0.96 12/23/2016 0951       Impression and Plan: Ms. Bovee is a very pleasant 72 yo African American female with anemia of chronic renal insufficiency and erythropoietin deficiency. She has some occasional fatigue.  Hgb is 9.9 so she received Aranesp today.  We will plan to see her back in another 3 months for follow-up.  She will contact our office with  any questions or concerns. We can certainly see her sooner if need be.    Laverna Peace, NP 11/12/201911:24 AM

## 2018-01-30 NOTE — Patient Instructions (Signed)

## 2018-01-31 LAB — IRON AND TIBC
Iron: 109 ug/dL (ref 41–142)
Saturation Ratios: 25 % (ref 21–57)
TIBC: 428 ug/dL (ref 236–444)
UIBC: 319 ug/dL (ref 120–384)

## 2018-01-31 LAB — FERRITIN: FERRITIN: 427 ng/mL — AB (ref 11–307)

## 2018-05-01 ENCOUNTER — Telehealth: Payer: Self-pay | Admitting: Family

## 2018-05-01 ENCOUNTER — Other Ambulatory Visit: Payer: Self-pay

## 2018-05-01 ENCOUNTER — Inpatient Hospital Stay: Payer: Medicare Other

## 2018-05-01 ENCOUNTER — Encounter: Payer: Self-pay | Admitting: Family

## 2018-05-01 ENCOUNTER — Inpatient Hospital Stay: Payer: Medicare Other | Attending: Family | Admitting: Family

## 2018-05-01 VITALS — BP 119/54 | HR 94 | Temp 98.2°F | Resp 18 | Ht 62.5 in | Wt 124.8 lb

## 2018-05-01 DIAGNOSIS — R002 Palpitations: Secondary | ICD-10-CM | POA: Diagnosis not present

## 2018-05-01 DIAGNOSIS — R5383 Other fatigue: Secondary | ICD-10-CM | POA: Diagnosis not present

## 2018-05-01 DIAGNOSIS — Z79899 Other long term (current) drug therapy: Secondary | ICD-10-CM | POA: Insufficient documentation

## 2018-05-01 DIAGNOSIS — R200 Anesthesia of skin: Secondary | ICD-10-CM | POA: Diagnosis not present

## 2018-05-01 DIAGNOSIS — D6489 Other specified anemias: Secondary | ICD-10-CM | POA: Diagnosis not present

## 2018-05-01 DIAGNOSIS — I129 Hypertensive chronic kidney disease with stage 1 through stage 4 chronic kidney disease, or unspecified chronic kidney disease: Secondary | ICD-10-CM | POA: Diagnosis not present

## 2018-05-01 DIAGNOSIS — D631 Anemia in chronic kidney disease: Secondary | ICD-10-CM | POA: Insufficient documentation

## 2018-05-01 DIAGNOSIS — D508 Other iron deficiency anemias: Secondary | ICD-10-CM

## 2018-05-01 DIAGNOSIS — R202 Paresthesia of skin: Secondary | ICD-10-CM | POA: Insufficient documentation

## 2018-05-01 DIAGNOSIS — N189 Chronic kidney disease, unspecified: Secondary | ICD-10-CM | POA: Diagnosis not present

## 2018-05-01 LAB — CBC WITH DIFFERENTIAL (CANCER CENTER ONLY)
Abs Immature Granulocytes: 0.02 10*3/uL (ref 0.00–0.07)
Basophils Absolute: 0 10*3/uL (ref 0.0–0.1)
Basophils Relative: 1 %
EOS ABS: 0.1 10*3/uL (ref 0.0–0.5)
EOS PCT: 2 %
HCT: 28 % — ABNORMAL LOW (ref 36.0–46.0)
HEMOGLOBIN: 8.7 g/dL — AB (ref 12.0–15.0)
Immature Granulocytes: 0 %
LYMPHS ABS: 1.2 10*3/uL (ref 0.7–4.0)
Lymphocytes Relative: 23 %
MCH: 32 pg (ref 26.0–34.0)
MCHC: 31.1 g/dL (ref 30.0–36.0)
MCV: 102.9 fL — AB (ref 80.0–100.0)
MONOS PCT: 9 %
Monocytes Absolute: 0.4 10*3/uL (ref 0.1–1.0)
NRBC: 0 % (ref 0.0–0.2)
Neutro Abs: 3.4 10*3/uL (ref 1.7–7.7)
Neutrophils Relative %: 65 %
Platelet Count: 233 10*3/uL (ref 150–400)
RBC: 2.72 MIL/uL — ABNORMAL LOW (ref 3.87–5.11)
RDW: 13.4 % (ref 11.5–15.5)
WBC Count: 5.1 10*3/uL (ref 4.0–10.5)

## 2018-05-01 LAB — CMP (CANCER CENTER ONLY)
ALK PHOS: 25 U/L — AB (ref 38–126)
ALT: 18 U/L (ref 0–44)
AST: 27 U/L (ref 15–41)
Albumin: 4.6 g/dL (ref 3.5–5.0)
Anion gap: 9 (ref 5–15)
BUN: 22 mg/dL (ref 8–23)
CO2: 26 mmol/L (ref 22–32)
CREATININE: 1.46 mg/dL — AB (ref 0.44–1.00)
Calcium: 10.3 mg/dL (ref 8.9–10.3)
Chloride: 108 mmol/L (ref 98–111)
GFR, EST AFRICAN AMERICAN: 41 mL/min — AB (ref >60)
GFR, Estimated: 35 mL/min — ABNORMAL LOW (ref >60)
GLUCOSE: 127 mg/dL — AB (ref 70–99)
Potassium: 4.7 mmol/L (ref 3.5–5.1)
SODIUM: 143 mmol/L (ref 135–145)
Total Bilirubin: 0.7 mg/dL (ref 0.3–1.2)
Total Protein: 7.2 g/dL (ref 6.5–8.1)

## 2018-05-01 MED ORDER — DARBEPOETIN ALFA 300 MCG/0.6ML IJ SOSY
PREFILLED_SYRINGE | INTRAMUSCULAR | Status: AC
  Filled 2018-05-01: qty 0.6

## 2018-05-01 MED ORDER — DARBEPOETIN ALFA 300 MCG/0.6ML IJ SOSY
300.0000 ug | PREFILLED_SYRINGE | INTRAMUSCULAR | Status: DC
  Administered 2018-05-01: 300 ug via SUBCUTANEOUS

## 2018-05-01 NOTE — Telephone Encounter (Signed)
Appointments scheduled letter/calendar mailed per 2/11 los

## 2018-05-01 NOTE — Patient Instructions (Signed)

## 2018-05-01 NOTE — Progress Notes (Signed)
Hematology and Oncology Follow Up Visit  LARUE DRAWDY [id number] 1945-10-28 73 y.o. 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 Prettyman is here today for follow-up. She is still having some mild fatigue and occasional palpitations.  Hgb today is 8.7, MCV 102, platelet count 233.  No episodes of bleeding, no bruising or petechiae.  No fever, chills, n/v, cough, rash, dizziness, SOB, chest pain, abdominal pain or changes in bowel or bladder habits.  She has some constipation and takes Mylanta as needed.  Since spraining her left ankle years ago she has intermittent puffiness.  No tenderness, numbness or tingling in her extremities at this time. She has occasional numbness and tingling in her fingertips that resolves with changing position.  No lymphadenopathy noted on exam.  She has maintained a good appetite and I staying well hydrated. Her weight is stable.  ECOG Performance Status: 1 - Symptomatic but completely ambulatory  Medications:  Allergies as of 05/01/2018      Reactions   Penicillins Itching, Swelling   Has patient had a PCN reaction causing immediate rash, facial/tongue/throat swelling, SOB or lightheadedness with hypotension: Yes Has patient had a PCN reaction causing severe rash involving mucus membranes or skin necrosis: No Has patient had a PCN reaction that required hospitalization: No Has patient had a PCN reaction occurring within the last 10 years: No If all of the above answers are "NO", then may proceed with Cephalosporin use.   Valsartan Other (See Comments), Itching   Heart sped up      Medication List       Accurate as of May 01, 2018 10:22 AM. Always use your most recent med list.        aspirin EC 81 MG tablet Take 81 mg by mouth daily.   Cyanocobalamin 100 MCG  Lozg Inject as directed.   Fenofibric Acid 135 MG Cpdr fenofibric acid (choline) 135 mg capsule,delayed release   fluticasone 50 MCG/ACT nasal spray Commonly known as:  FLONASE U 1 SPRAY IEN BID   hydrogen peroxide 3 % external solution Apply topically.   IMVEXXY MAINTENANCE PACK 4 MCG Inst Generic drug:  Estradiol Imvexxy Maintenance Pack 4 mcg vaginal insert  Insert 1 vaginal insert twice a week by vaginal route.   olmesartan 20 MG tablet Commonly known as:  BENICAR Take 20 mg by mouth daily.   RESTASIS 0.05 % ophthalmic emulsion Generic drug:  cycloSPORINE INT 1 GTT IN OU BID UTD   SUMAtriptan 25 MG tablet Commonly known as:  IMITREX Take 25 mg by mouth once as needed for migraine (MAY REPEAT ONCE IN 2 HOURS IF NO RELIEF (MAX 2 TABLETS/24 HOURS)).   Vitamin D (Ergocalciferol) 1.25 MG (50000 UT) Caps capsule Commonly known as:  DRISDOL Take 50,000 Units by mouth every 7 (seven) days.   ergocalciferol 1.25 MG (50000 UT) capsule Commonly known as:  VITAMIN D2 Take 50,000 Units by mouth once a week.       Allergies:  Allergies  Allergen Reactions  . Penicillins Itching and Swelling    Has patient had a PCN reaction causing immediate rash, facial/tongue/throat swelling, SOB or lightheadedness with hypotension: Yes Has patient had a PCN reaction causing severe rash involving mucus membranes or skin necrosis: No Has patient had a PCN reaction that required hospitalization: No Has patient had a PCN reaction occurring within  the last 10 years: No If all of the above answers are "NO", then may proceed with Cephalosporin use.   . Valsartan Other (See Comments) and Itching    Heart sped up    Past Medical History, Surgical history, Social history, and Family History were reviewed and updated.  Review of Systems: All other 10 point review of systems is negative.   Physical Exam:  vitals were not taken for this visit.   Wt Readings from Last 3 Encounters:   01/30/18 124 lb (56.2 kg)  11/24/17 123 lb (55.8 kg)  09/25/17 124 lb (56.2 kg)    Ocular: Sclerae unicteric, pupils equal, round and reactive to light Ear-nose-throat: Oropharynx clear, dentition fair Lymphatic: No cervical, supraclavicular or axillary adenopathy Lungs no rales or rhonchi, good excursion bilaterally Heart regular rate and rhythm, no murmur appreciated Abd soft, nontender, positive bowel sounds, no liver or spleen tip palpated on exam, no fluid wave  MSK no focal spinal tenderness, no joint edema Neuro: non-focal, well-oriented, appropriate affect Breasts: Deferred   Lab Results  Component Value Date   WBC 4.8 01/30/2018   HGB 9.9 (L) 01/30/2018   HCT 32.9 (L) 01/30/2018   MCV 102.5 (H) 01/30/2018   PLT 273 01/30/2018   Lab Results  Component Value Date   FERRITIN 427 (H) 01/30/2018   IRON 109 01/30/2018   TIBC 428 01/30/2018   UIBC 319 01/30/2018   IRONPCTSAT 25 01/30/2018   Lab Results  Component Value Date   RETICCTPCT 1.2 11/24/2017   RBC 3.21 (L) 01/30/2018   No results found for: KPAFRELGTCHN, LAMBDASER, KAPLAMBRATIO No results found for: IGGSERUM, IGA, IGMSERUM No results found for: Odetta Pink, SPEI   Chemistry      Component Value Date/Time   NA 139 01/30/2018 1051   NA 145 02/13/2017 1046   NA 142 12/23/2016 0951   K 5.2 (H) 01/30/2018 1051   K 4.6 02/13/2017 1046   K 3.9 12/23/2016 0951   CL 107 01/30/2018 1051   CL 108 02/13/2017 1046   CO2 24 01/30/2018 1051   CO2 28 02/13/2017 1046   CO2 25 12/23/2016 0951   BUN 35 (H) 01/30/2018 1051   BUN 22 02/13/2017 1046   BUN 15.0 12/23/2016 0951   CREATININE 1.70 (H) 01/30/2018 1051   CREATININE 1.1 02/13/2017 1046   CREATININE 0.9 12/23/2016 0951      Component Value Date/Time   CALCIUM 10.1 01/30/2018 1051   CALCIUM 9.7 02/13/2017 1046   CALCIUM 9.5 12/23/2016 0951   ALKPHOS 34 (L) 01/30/2018 1051   ALKPHOS 60 02/13/2017  1046   ALKPHOS 69 12/23/2016 0951   AST 29 01/30/2018 1051   AST 26 12/23/2016 0951   ALT 19 01/30/2018 1051   ALT 18 02/13/2017 1046   ALT 16 12/23/2016 0951   BILITOT 0.6 01/30/2018 1051   BILITOT 0.96 12/23/2016 0951       Impression and Plan: Ms. Halk is a very pleasant 73 yo African American female with anemia of chronic renal insufficiency and erythropoietin deficiency.  She is symptomatic with fatigue and occasional palpitations with exertion lasting only a few seconds.  She will receive Aranesp today.  We will plan to see her back in another 8 weeks for follow-up.  She will contact our office with any questions or concerns. We can certainly see her sooner if need be.   Laverna Peace, NP 2/11/202010:22 AM

## 2018-05-02 LAB — IRON AND TIBC
Iron: 103 ug/dL (ref 41–142)
Saturation Ratios: 26 % (ref 21–57)
TIBC: 400 ug/dL (ref 236–444)
UIBC: 297 ug/dL (ref 120–384)

## 2018-05-02 LAB — FERRITIN: Ferritin: 446 ng/mL — ABNORMAL HIGH (ref 11–307)

## 2018-07-02 ENCOUNTER — Inpatient Hospital Stay (HOSPITAL_BASED_OUTPATIENT_CLINIC_OR_DEPARTMENT_OTHER): Payer: Medicare Other | Admitting: Hematology & Oncology

## 2018-07-02 ENCOUNTER — Inpatient Hospital Stay: Payer: Medicare Other

## 2018-07-02 ENCOUNTER — Inpatient Hospital Stay: Payer: Medicare Other | Attending: Family

## 2018-07-02 ENCOUNTER — Other Ambulatory Visit: Payer: Self-pay

## 2018-07-02 VITALS — BP 142/64 | HR 89 | Temp 98.6°F | Resp 18 | Wt 121.0 lb

## 2018-07-02 DIAGNOSIS — N189 Chronic kidney disease, unspecified: Secondary | ICD-10-CM | POA: Diagnosis not present

## 2018-07-02 DIAGNOSIS — D508 Other iron deficiency anemias: Secondary | ICD-10-CM

## 2018-07-02 DIAGNOSIS — I129 Hypertensive chronic kidney disease with stage 1 through stage 4 chronic kidney disease, or unspecified chronic kidney disease: Secondary | ICD-10-CM | POA: Diagnosis present

## 2018-07-02 DIAGNOSIS — D631 Anemia in chronic kidney disease: Secondary | ICD-10-CM

## 2018-07-02 DIAGNOSIS — D6489 Other specified anemias: Secondary | ICD-10-CM | POA: Insufficient documentation

## 2018-07-02 DIAGNOSIS — Z79899 Other long term (current) drug therapy: Secondary | ICD-10-CM

## 2018-07-02 LAB — CMP (CANCER CENTER ONLY)
ALT: 17 U/L (ref 0–44)
AST: 26 U/L (ref 15–41)
Albumin: 4.6 g/dL (ref 3.5–5.0)
Alkaline Phosphatase: 30 U/L — ABNORMAL LOW (ref 38–126)
Anion gap: 8 (ref 5–15)
BUN: 27 mg/dL — ABNORMAL HIGH (ref 8–23)
CO2: 27 mmol/L (ref 22–32)
Calcium: 9.8 mg/dL (ref 8.9–10.3)
Chloride: 102 mmol/L (ref 98–111)
Creatinine: 1.4 mg/dL — ABNORMAL HIGH (ref 0.44–1.00)
GFR, Est AFR Am: 43 mL/min — ABNORMAL LOW (ref >60)
GFR, Estimated: 37 mL/min — ABNORMAL LOW (ref >60)
Glucose, Bld: 103 mg/dL — ABNORMAL HIGH (ref 70–99)
Potassium: 3.9 mmol/L (ref 3.5–5.1)
Sodium: 137 mmol/L (ref 135–145)
Total Bilirubin: 0.5 mg/dL (ref 0.3–1.2)
Total Protein: 7.6 g/dL (ref 6.5–8.1)

## 2018-07-02 LAB — CBC WITH DIFFERENTIAL (CANCER CENTER ONLY)
Abs Immature Granulocytes: 0.01 10*3/uL (ref 0.00–0.07)
Basophils Absolute: 0 10*3/uL (ref 0.0–0.1)
Basophils Relative: 1 %
Eosinophils Absolute: 0.2 10*3/uL (ref 0.0–0.5)
Eosinophils Relative: 4 %
HCT: 32.5 % — ABNORMAL LOW (ref 36.0–46.0)
Hemoglobin: 10.2 g/dL — ABNORMAL LOW (ref 12.0–15.0)
Immature Granulocytes: 0 %
Lymphocytes Relative: 32 %
Lymphs Abs: 1.5 10*3/uL (ref 0.7–4.0)
MCH: 31.5 pg (ref 26.0–34.0)
MCHC: 31.4 g/dL (ref 30.0–36.0)
MCV: 100.3 fL — ABNORMAL HIGH (ref 80.0–100.0)
Monocytes Absolute: 0.4 10*3/uL (ref 0.1–1.0)
Monocytes Relative: 8 %
Neutro Abs: 2.6 10*3/uL (ref 1.7–7.7)
Neutrophils Relative %: 55 %
Platelet Count: 233 10*3/uL (ref 150–400)
RBC: 3.24 MIL/uL — ABNORMAL LOW (ref 3.87–5.11)
RDW: 12.5 % (ref 11.5–15.5)
WBC Count: 4.6 10*3/uL (ref 4.0–10.5)
nRBC: 0 % (ref 0.0–0.2)

## 2018-07-02 MED ORDER — DARBEPOETIN ALFA 300 MCG/0.6ML IJ SOSY
300.0000 ug | PREFILLED_SYRINGE | INTRAMUSCULAR | Status: DC
  Administered 2018-07-02: 300 ug via SUBCUTANEOUS

## 2018-07-02 NOTE — Patient Instructions (Signed)

## 2018-07-02 NOTE — Progress Notes (Signed)
Hematology and Oncology Follow Up Visit  Rachael Davenport [id number] [birth year] 73 y.o. 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  Last saw her back in February.  At that time, she was doing pretty well.  She did go ahead and get a dose of Aranesp.  More last saw her, her ferritin was 446 with an iron saturation of 26%.  She had a quiet Easter weekend.  She really is not going outside.  She has had no issues with bleeding.  Her last mammogram was a year ago.  She has had no change in bowel or bladder habits.  There is been no leg swelling.  She says on occasion she feels like there is something stuck in her throat.  I told her that she could always talk to her family doctor and see about getting her a referral to ENT.  Overall, her performance status is ECOG 1.  Medications:  Allergies as of 07/02/2018      Reactions   Penicillins Itching, Swelling   Has patient had a PCN reaction causing immediate rash, facial/tongue/throat swelling, SOB or lightheadedness with hypotension: Yes Has patient had a PCN reaction causing severe rash involving mucus membranes or skin necrosis: No Has patient had a PCN reaction that required hospitalization: No Has patient had a PCN reaction occurring within the last 10 years: No If all of the above answers are "NO", then may proceed with Cephalosporin use.   Valsartan Other (See Comments), Itching   Heart sped up      Medication List       Accurate as of July 02, 2018  4:47 PM. Always use your most recent med list.        aspirin EC 81 MG tablet Take 81 mg by mouth daily.   Cyanocobalamin 100 MCG Lozg Inject as directed.   Fenofibric Acid 135 MG Cpdr fenofibric acid (choline) 135 mg capsule,delayed release   fluticasone 50  MCG/ACT nasal spray Commonly known as:  FLONASE U 1 SPRAY IEN BID   hydrogen peroxide 3 % external solution Apply topically.   Imvexxy Maintenance Pack 4 MCG Inst Generic drug:  Estradiol Imvexxy Maintenance Pack 4 mcg vaginal insert  Insert 1 vaginal insert twice a week by vaginal route.   olmesartan 20 MG tablet Commonly known as:  BENICAR Take 20 mg by mouth daily.   Restasis 0.05 % ophthalmic emulsion Generic drug:  cycloSPORINE INT 1 GTT IN OU BID UTD   SUMAtriptan 25 MG tablet Commonly known as:  IMITREX Take 25 mg by mouth once as needed for migraine (MAY REPEAT ONCE IN 2 HOURS IF NO RELIEF (MAX 2 TABLETS/24 HOURS)).   Vitamin D (Ergocalciferol) 1.25 MG (50000 UT) Caps capsule Commonly known as:  DRISDOL Take 50,000 Units by mouth every 7 (seven) days.   ergocalciferol 1.25 MG (50000 UT) capsule Commonly known as:  VITAMIN D2 Take 50,000 Units by mouth once a week.       Allergies:  Allergies  Allergen Reactions  . Penicillins Itching and Swelling    Has patient had a PCN reaction causing immediate rash, facial/tongue/throat swelling, SOB or lightheadedness with hypotension: Yes Has patient had a PCN reaction causing severe rash involving mucus membranes or skin necrosis: No Has patient had a PCN reaction that required hospitalization: No Has  patient had a PCN reaction occurring within the last 10 years: No If all of the above answers are "NO", then may proceed with Cephalosporin use.   . Valsartan Other (See Comments) and Itching    Heart sped up    Past Medical History, Surgical history, Social history, and Family History were reviewed and updated.  Review of Systems: Review of Systems  Constitutional: Negative.   HENT: Negative.   Eyes: Negative.   Respiratory: Negative.   Cardiovascular: Negative.   Gastrointestinal: Negative.   Genitourinary: Negative.   Musculoskeletal: Negative.   Skin: Negative.   Neurological: Negative.    Endo/Heme/Allergies: Negative.   Psychiatric/Behavioral: Negative.       Physical Exam:  weight is 121 lb (54.9 kg). Her oral temperature is 98.6 F (37 C). Her blood pressure is 142/64 (abnormal) and her pulse is 89. Her respiration is 18 and oxygen saturation is 100%.   Wt Readings from Last 3 Encounters:  07/02/18 121 lb (54.9 kg)  05/01/18 124 lb 12.8 oz (56.6 kg)  01/30/18 124 lb (56.2 kg)    Physical Exam Vitals signs reviewed.  HENT:     Head: Normocephalic and atraumatic.  Eyes:     Pupils: Pupils are equal, round, and reactive to light.  Neck:     Musculoskeletal: Normal range of motion.  Cardiovascular:     Rate and Rhythm: Normal rate and regular rhythm.     Heart sounds: Normal heart sounds.  Pulmonary:     Effort: Pulmonary effort is normal.     Breath sounds: Normal breath sounds.  Abdominal:     General: Bowel sounds are normal.     Palpations: Abdomen is soft.  Musculoskeletal: Normal range of motion.        General: No tenderness or deformity.  Lymphadenopathy:     Cervical: No cervical adenopathy.  Skin:    General: Skin is warm and dry.     Findings: No erythema or rash.  Neurological:     Mental Status: She is alert and oriented to person, place, and time.  Psychiatric:        Behavior: Behavior normal.        Thought Content: Thought content normal.        Judgment: Judgment normal.      Lab Results  Component Value Date   WBC 4.6 07/02/2018   HGB 10.2 (L) 07/02/2018   HCT 32.5 (L) 07/02/2018   MCV 100.3 (H) 07/02/2018   PLT 233 07/02/2018   Lab Results  Component Value Date   FERRITIN 446 (H) 05/01/2018   IRON 103 05/01/2018   TIBC 400 05/01/2018   UIBC 297 05/01/2018   IRONPCTSAT 26 05/01/2018   Lab Results  Component Value Date   RETICCTPCT 1.2 11/24/2017   RBC 3.24 (L) 07/02/2018   No results found for: KPAFRELGTCHN, LAMBDASER, KAPLAMBRATIO No results found for: IGGSERUM, IGA, IGMSERUM No results found for:  Odetta Pink, SPEI   Chemistry      Component Value Date/Time   NA 137 07/02/2018 1550   NA 145 02/13/2017 1046   NA 142 12/23/2016 0951   K 3.9 07/02/2018 1550   K 4.6 02/13/2017 1046   K 3.9 12/23/2016 0951   CL 102 07/02/2018 1550   CL 108 02/13/2017 1046   CO2 27 07/02/2018 1550   CO2 28 02/13/2017 1046   CO2 25 12/23/2016 0951   BUN 27 (H) 07/02/2018 1550   BUN 22  02/13/2017 1046   BUN 15.0 12/23/2016 0951   CREATININE 1.40 (H) 07/02/2018 1550   CREATININE 1.1 02/13/2017 1046   CREATININE 0.9 12/23/2016 0951      Component Value Date/Time   CALCIUM 9.8 07/02/2018 1550   CALCIUM 9.7 02/13/2017 1046   CALCIUM 9.5 12/23/2016 0951   ALKPHOS 30 (L) 07/02/2018 1550   ALKPHOS 60 02/13/2017 1046   ALKPHOS 69 12/23/2016 0951   AST 26 07/02/2018 1550   AST 26 12/23/2016 0951   ALT 17 07/02/2018 1550   ALT 18 02/13/2017 1046   ALT 16 12/23/2016 0951   BILITOT 0.5 07/02/2018 1550   BILITOT 0.96 12/23/2016 0951       Impression and Plan: Ms. Sill is a very pleasant 73 yo African American female with anemia of chronic renal insufficiency and erythropoietin deficiency.   I am glad to see that her hemoglobin is a little bit better.  Hopefully, with another dose of Aranesp, she will get her hemoglobin above 11.  We will plan to see her back in another 2 months.  I think this would be reasonable.  We will see what her iron studies look like. Marland Kitchen   Volanda Napoleon, MD 4/13/20204:47 PM

## 2018-07-03 ENCOUNTER — Telehealth: Payer: Self-pay | Admitting: Hematology & Oncology

## 2018-07-03 LAB — IRON AND TIBC
Iron: 68 ug/dL (ref 41–142)
Saturation Ratios: 17 % — ABNORMAL LOW (ref 21–57)
TIBC: 395 ug/dL (ref 236–444)
UIBC: 327 ug/dL (ref 120–384)

## 2018-07-03 LAB — FERRITIN: Ferritin: 366 ng/mL — ABNORMAL HIGH (ref 11–307)

## 2018-07-03 NOTE — Telephone Encounter (Signed)
sw pt to confirm 6/4 appt at 3:15pm per 4/13 LOS

## 2018-07-09 ENCOUNTER — Other Ambulatory Visit: Payer: Self-pay | Admitting: Hematology & Oncology

## 2018-07-09 ENCOUNTER — Encounter: Payer: Self-pay | Admitting: Hematology & Oncology

## 2018-07-09 DIAGNOSIS — D500 Iron deficiency anemia secondary to blood loss (chronic): Secondary | ICD-10-CM

## 2018-07-09 HISTORY — DX: Iron deficiency anemia secondary to blood loss (chronic): D50.0

## 2018-08-22 ENCOUNTER — Other Ambulatory Visit: Payer: Medicare Other

## 2018-08-22 ENCOUNTER — Ambulatory Visit: Payer: Medicare Other | Admitting: Hematology & Oncology

## 2018-08-22 ENCOUNTER — Ambulatory Visit: Payer: Medicare Other

## 2018-08-23 ENCOUNTER — Encounter: Payer: Self-pay | Admitting: Hematology & Oncology

## 2018-08-23 ENCOUNTER — Other Ambulatory Visit: Payer: Self-pay

## 2018-08-23 ENCOUNTER — Inpatient Hospital Stay: Payer: Medicare Other

## 2018-08-23 ENCOUNTER — Inpatient Hospital Stay (HOSPITAL_BASED_OUTPATIENT_CLINIC_OR_DEPARTMENT_OTHER): Payer: Medicare Other | Admitting: Hematology & Oncology

## 2018-08-23 ENCOUNTER — Inpatient Hospital Stay: Payer: Medicare Other | Attending: Family

## 2018-08-23 VITALS — BP 133/55 | HR 87 | Temp 98.7°F | Resp 18 | Wt 121.0 lb

## 2018-08-23 DIAGNOSIS — D631 Anemia in chronic kidney disease: Secondary | ICD-10-CM | POA: Insufficient documentation

## 2018-08-23 DIAGNOSIS — D6489 Other specified anemias: Secondary | ICD-10-CM | POA: Insufficient documentation

## 2018-08-23 DIAGNOSIS — Z7982 Long term (current) use of aspirin: Secondary | ICD-10-CM | POA: Diagnosis not present

## 2018-08-23 DIAGNOSIS — Z79899 Other long term (current) drug therapy: Secondary | ICD-10-CM | POA: Insufficient documentation

## 2018-08-23 DIAGNOSIS — D500 Iron deficiency anemia secondary to blood loss (chronic): Secondary | ICD-10-CM

## 2018-08-23 DIAGNOSIS — I129 Hypertensive chronic kidney disease with stage 1 through stage 4 chronic kidney disease, or unspecified chronic kidney disease: Secondary | ICD-10-CM | POA: Insufficient documentation

## 2018-08-23 DIAGNOSIS — N189 Chronic kidney disease, unspecified: Secondary | ICD-10-CM

## 2018-08-23 LAB — CMP (CANCER CENTER ONLY)
ALT: 20 U/L (ref 0–44)
AST: 28 U/L (ref 15–41)
Albumin: 4.5 g/dL (ref 3.5–5.0)
Alkaline Phosphatase: 28 U/L — ABNORMAL LOW (ref 38–126)
Anion gap: 8 (ref 5–15)
BUN: 27 mg/dL — ABNORMAL HIGH (ref 8–23)
CO2: 25 mmol/L (ref 22–32)
Calcium: 9.8 mg/dL (ref 8.9–10.3)
Chloride: 106 mmol/L (ref 98–111)
Creatinine: 1.24 mg/dL — ABNORMAL HIGH (ref 0.44–1.00)
GFR, Est AFR Am: 50 mL/min — ABNORMAL LOW (ref >60)
GFR, Estimated: 43 mL/min — ABNORMAL LOW (ref >60)
Glucose, Bld: 101 mg/dL — ABNORMAL HIGH (ref 70–99)
Potassium: 4.6 mmol/L (ref 3.5–5.1)
Sodium: 139 mmol/L (ref 135–145)
Total Bilirubin: 0.6 mg/dL (ref 0.3–1.2)
Total Protein: 7.7 g/dL (ref 6.5–8.1)

## 2018-08-23 LAB — RETICULOCYTES
Immature Retic Fract: 5.2 % (ref 2.3–15.9)
RBC.: 3.32 MIL/uL — ABNORMAL LOW (ref 3.87–5.11)
Retic Count, Absolute: 25.9 10*3/uL (ref 19.0–186.0)
Retic Ct Pct: 0.8 % (ref 0.4–3.1)

## 2018-08-23 LAB — CBC WITH DIFFERENTIAL (CANCER CENTER ONLY)
Abs Immature Granulocytes: 0.01 10*3/uL (ref 0.00–0.07)
Basophils Absolute: 0 10*3/uL (ref 0.0–0.1)
Basophils Relative: 1 %
Eosinophils Absolute: 0.2 10*3/uL (ref 0.0–0.5)
Eosinophils Relative: 3 %
HCT: 32.4 % — ABNORMAL LOW (ref 36.0–46.0)
Hemoglobin: 10.3 g/dL — ABNORMAL LOW (ref 12.0–15.0)
Immature Granulocytes: 0 %
Lymphocytes Relative: 33 %
Lymphs Abs: 1.7 10*3/uL (ref 0.7–4.0)
MCH: 30.8 pg (ref 26.0–34.0)
MCHC: 31.8 g/dL (ref 30.0–36.0)
MCV: 97 fL (ref 80.0–100.0)
Monocytes Absolute: 0.5 10*3/uL (ref 0.1–1.0)
Monocytes Relative: 10 %
Neutro Abs: 2.7 10*3/uL (ref 1.7–7.7)
Neutrophils Relative %: 53 %
Platelet Count: 149 10*3/uL — ABNORMAL LOW (ref 150–400)
RBC: 3.34 MIL/uL — ABNORMAL LOW (ref 3.87–5.11)
RDW: 12.9 % (ref 11.5–15.5)
WBC Count: 5.1 10*3/uL (ref 4.0–10.5)
nRBC: 0 % (ref 0.0–0.2)

## 2018-08-23 MED ORDER — DARBEPOETIN ALFA 300 MCG/0.6ML IJ SOSY
PREFILLED_SYRINGE | INTRAMUSCULAR | Status: AC
  Filled 2018-08-23: qty 0.6

## 2018-08-23 MED ORDER — DARBEPOETIN ALFA 300 MCG/0.6ML IJ SOSY
300.0000 ug | PREFILLED_SYRINGE | INTRAMUSCULAR | Status: DC
  Administered 2018-08-23: 16:00:00 300 ug via SUBCUTANEOUS

## 2018-08-23 MED ORDER — FUSION PLUS PO CAPS: 1.0000 | ORAL_CAPSULE | Freq: Every day | ORAL | 4 refills | Status: DC

## 2018-08-23 NOTE — Patient Instructions (Signed)

## 2018-08-23 NOTE — Progress Notes (Signed)
Hematology and Oncology Follow Up Visit  ALEKSIS JIGGETTS [id number] 14-Jun-1945 73 y.o. 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 Depner is here today for follow-up.  She is doing pretty well.  She really has no specific complaints outside of the fact that the world is deteriorating.  She really is upset overall on the protest and writing going on.  We last saw her back in April.  At that time she did have a slightly low iron saturation level.  Her iron saturation was 17%.  I do not think it would be a bad idea to have her on oral iron.  I sent a prescription Fusion Plus to your pharmacy.  Hopefully she will be able to get this.  She has had no bleeding.  There is been no nausea or vomiting.  She has had no cough.  Overall, her performance status is ECOG 1.  Medications:  Allergies as of 08/23/2018      Reactions   Penicillins Itching, Swelling   Has patient had a PCN reaction causing immediate rash, facial/tongue/throat swelling, SOB or lightheadedness with hypotension: Yes Has patient had a PCN reaction causing severe rash involving mucus membranes or skin necrosis: No Has patient had a PCN reaction that required hospitalization: No Has patient had a PCN reaction occurring within the last 10 years: No If all of the above answers are "NO", then may proceed with Cephalosporin use.   Valsartan Other (See Comments), Itching   Heart sped up      Medication List       Accurate as of August 23, 2018  3:49 PM. If you have any questions, ask your nurse or doctor.        aspirin EC 81 MG tablet Take 81 mg by mouth daily.   Cyanocobalamin 100 MCG Lozg Inject as directed.   Fenofibric Acid 135 MG Cpdr fenofibric acid (choline) 135 mg capsule,delayed release   fluticasone 50 MCG/ACT nasal spray Commonly known as:   FLONASE U 1 SPRAY IEN BID   hydrogen peroxide 3 % external solution Apply topically.   Imvexxy Maintenance Pack 4 MCG Inst Generic drug:  Estradiol Imvexxy Maintenance Pack 4 mcg vaginal insert  Insert 1 vaginal insert twice a week by vaginal route.   olmesartan 20 MG tablet Commonly known as:  BENICAR Take 20 mg by mouth daily.   Restasis 0.05 % ophthalmic emulsion Generic drug:  cycloSPORINE INT 1 GTT IN OU BID UTD   SUMAtriptan 25 MG tablet Commonly known as:  IMITREX Take 25 mg by mouth once as needed for migraine (MAY REPEAT ONCE IN 2 HOURS IF NO RELIEF (MAX 2 TABLETS/24 HOURS)).   Vitamin D (Ergocalciferol) 1.25 MG (50000 UT) Caps capsule Commonly known as:  DRISDOL Take 50,000 Units by mouth every 7 (seven) days. What changed:  Another medication with the same name was removed. Continue taking this medication, and follow the directions you see here. Changed by:  Volanda Napoleon, MD   ergocalciferol 1.25 MG (50000 UT) capsule Commonly known as:  VITAMIN D2 Take 50,000 Units by mouth once a week. What changed:  Another medication with the same name was removed. Continue taking this medication, and follow the directions you see here. Changed by:  Volanda Napoleon, MD       Allergies:  Allergies  Allergen Reactions  Penicillins Itching and Swelling    Has patient had a PCN reaction causing immediate rash, facial/tongue/throat swelling, SOB or lightheadedness with hypotension: Yes Has patient had a PCN reaction causing severe rash involving mucus membranes or skin necrosis: No Has patient had a PCN reaction that required hospitalization: No Has patient had a PCN reaction occurring within the last 10 years: No If all of the above answers are "NO", then may proceed with Cephalosporin use.    Valsartan Other (See Comments) and Itching    Heart sped up    Past Medical History, Surgical history, Social history, and Family History were reviewed and updated.  Review  of Systems: Review of Systems  Constitutional: Negative.   HENT: Negative.   Eyes: Negative.   Respiratory: Negative.   Cardiovascular: Negative.   Gastrointestinal: Negative.   Genitourinary: Negative.   Musculoskeletal: Negative.   Skin: Negative.   Neurological: Negative.   Endo/Heme/Allergies: Negative.   Psychiatric/Behavioral: Negative.       Physical Exam:  weight is 121 lb (54.9 kg). Her oral temperature is 98.7 F (37.1 C). Her blood pressure is 133/55 (abnormal) and her pulse is 87. Her respiration is 18 and oxygen saturation is 100%.   Wt Readings from Last 3 Encounters:  08/23/18 121 lb (54.9 kg)  07/02/18 121 lb (54.9 kg)  05/01/18 124 lb 12.8 oz (56.6 kg)    Physical Exam Vitals signs reviewed.  HENT:     Head: Normocephalic and atraumatic.  Eyes:     Pupils: Pupils are equal, round, and reactive to light.  Neck:     Musculoskeletal: Normal range of motion.  Cardiovascular:     Rate and Rhythm: Normal rate and regular rhythm.     Heart sounds: Normal heart sounds.  Pulmonary:     Effort: Pulmonary effort is normal.     Breath sounds: Normal breath sounds.  Abdominal:     General: Bowel sounds are normal.     Palpations: Abdomen is soft.  Musculoskeletal: Normal range of motion.        General: No tenderness or deformity.  Lymphadenopathy:     Cervical: No cervical adenopathy.  Skin:    General: Skin is warm and dry.     Findings: No erythema or rash.  Neurological:     Mental Status: She is alert and oriented to person, place, and time.  Psychiatric:        Behavior: Behavior normal.        Thought Content: Thought content normal.        Judgment: Judgment normal.      Lab Results  Component Value Date   WBC 5.1 08/23/2018   HGB 10.3 (L) 08/23/2018   HCT 32.4 (L) 08/23/2018   MCV 97.0 08/23/2018   PLT 149 (L) 08/23/2018   Lab Results  Component Value Date   FERRITIN 366 (H) 07/02/2018   IRON 68 07/02/2018   TIBC 395 07/02/2018    UIBC 327 07/02/2018   IRONPCTSAT 17 (L) 07/02/2018   Lab Results  Component Value Date   RETICCTPCT 0.8 08/23/2018   RBC 3.34 (L) 08/23/2018   RBC 3.32 (L) 08/23/2018   No results found for: KPAFRELGTCHN, LAMBDASER, KAPLAMBRATIO No results found for: IGGSERUM, IGA, IGMSERUM No results found for: Odetta Pink, SPEI   Chemistry      Component Value Date/Time   NA 139 08/23/2018 1509   NA 145 02/13/2017 1046   NA 142 12/23/2016 0951  K 4.6 08/23/2018 1509   K 4.6 02/13/2017 1046   K 3.9 12/23/2016 0951   CL 106 08/23/2018 1509   CL 108 02/13/2017 1046   CO2 25 08/23/2018 1509   CO2 28 02/13/2017 1046   CO2 25 12/23/2016 0951   BUN 27 (H) 08/23/2018 1509   BUN 22 02/13/2017 1046   BUN 15.0 12/23/2016 0951   CREATININE 1.24 (H) 08/23/2018 1509   CREATININE 1.1 02/13/2017 1046   CREATININE 0.9 12/23/2016 0951      Component Value Date/Time   CALCIUM 9.8 08/23/2018 1509   CALCIUM 9.7 02/13/2017 1046   CALCIUM 9.5 12/23/2016 0951   ALKPHOS 28 (L) 08/23/2018 1509   ALKPHOS 60 02/13/2017 1046   ALKPHOS 69 12/23/2016 0951   AST 28 08/23/2018 1509   AST 26 12/23/2016 0951   ALT 20 08/23/2018 1509   ALT 18 02/13/2017 1046   ALT 16 12/23/2016 0951   BILITOT 0.6 08/23/2018 1509   BILITOT 0.96 12/23/2016 0951       Impression and Plan: Ms. Nitsch is a very pleasant 73 yo African American female with anemia of chronic renal insufficiency and erythropoietin deficiency.   Her hemoglobin is relatively stable.  We will go ahead and give her a dose of Aranesp today.  Hopefully, the oral iron will help.  If not, then we can certainly try IV iron.  I would like to have her come back in about 6 weeks or so and we will see how she is doing and see how her hemoglobin is responding.  Marland Kitchen   Volanda Napoleon, MD 6/4/20203:49 PM

## 2018-08-24 ENCOUNTER — Telehealth: Payer: Self-pay | Admitting: *Deleted

## 2018-08-24 LAB — FERRITIN: Ferritin: 391 ng/mL — ABNORMAL HIGH (ref 11–307)

## 2018-08-24 LAB — IRON AND TIBC
Iron: 105 ug/dL (ref 41–142)
Saturation Ratios: 26 % (ref 21–57)
TIBC: 406 ug/dL (ref 236–444)
UIBC: 301 ug/dL (ref 120–384)

## 2018-08-24 NOTE — Telephone Encounter (Addendum)
Patient is aware of results  ----- Message from Volanda Napoleon, MD sent at 08/24/2018  1:37 PM EDT ----- Call - iron level is ok!!  Laurey Arrow

## 2018-10-03 ENCOUNTER — Other Ambulatory Visit: Payer: Self-pay | Admitting: Family

## 2018-10-04 ENCOUNTER — Other Ambulatory Visit: Payer: Self-pay

## 2018-10-04 ENCOUNTER — Encounter: Payer: Self-pay | Admitting: Family

## 2018-10-04 ENCOUNTER — Inpatient Hospital Stay: Payer: Medicare Other

## 2018-10-04 ENCOUNTER — Inpatient Hospital Stay: Payer: Medicare Other | Attending: Family | Admitting: Family

## 2018-10-04 VITALS — BP 112/51 | HR 82 | Temp 97.8°F | Wt 121.8 lb

## 2018-10-04 DIAGNOSIS — D631 Anemia in chronic kidney disease: Secondary | ICD-10-CM | POA: Insufficient documentation

## 2018-10-04 DIAGNOSIS — K5900 Constipation, unspecified: Secondary | ICD-10-CM | POA: Diagnosis not present

## 2018-10-04 DIAGNOSIS — N189 Chronic kidney disease, unspecified: Secondary | ICD-10-CM | POA: Insufficient documentation

## 2018-10-04 DIAGNOSIS — D508 Other iron deficiency anemias: Secondary | ICD-10-CM

## 2018-10-04 DIAGNOSIS — D509 Iron deficiency anemia, unspecified: Secondary | ICD-10-CM | POA: Insufficient documentation

## 2018-10-04 DIAGNOSIS — Z79899 Other long term (current) drug therapy: Secondary | ICD-10-CM | POA: Insufficient documentation

## 2018-10-04 DIAGNOSIS — D500 Iron deficiency anemia secondary to blood loss (chronic): Secondary | ICD-10-CM

## 2018-10-04 DIAGNOSIS — I129 Hypertensive chronic kidney disease with stage 1 through stage 4 chronic kidney disease, or unspecified chronic kidney disease: Secondary | ICD-10-CM | POA: Insufficient documentation

## 2018-10-04 LAB — CMP (CANCER CENTER ONLY)
ALT: 22 U/L (ref 0–44)
AST: 29 U/L (ref 15–41)
Albumin: 4.4 g/dL (ref 3.5–5.0)
Alkaline Phosphatase: 28 U/L — ABNORMAL LOW (ref 38–126)
Anion gap: 6 (ref 5–15)
BUN: 34 mg/dL — ABNORMAL HIGH (ref 8–23)
CO2: 26 mmol/L (ref 22–32)
Calcium: 9.2 mg/dL (ref 8.9–10.3)
Chloride: 108 mmol/L (ref 98–111)
Creatinine: 1.48 mg/dL — ABNORMAL HIGH (ref 0.44–1.00)
GFR, Est AFR Am: 40 mL/min — ABNORMAL LOW (ref >60)
GFR, Estimated: 35 mL/min — ABNORMAL LOW (ref >60)
Glucose, Bld: 89 mg/dL (ref 70–99)
Potassium: 5.6 mmol/L — ABNORMAL HIGH (ref 3.5–5.1)
Sodium: 140 mmol/L (ref 135–145)
Total Bilirubin: 0.6 mg/dL (ref 0.3–1.2)
Total Protein: 7.3 g/dL (ref 6.5–8.1)

## 2018-10-04 LAB — CBC WITH DIFFERENTIAL (CANCER CENTER ONLY)
Abs Immature Granulocytes: 0.01 10*3/uL (ref 0.00–0.07)
Basophils Absolute: 0.1 10*3/uL (ref 0.0–0.1)
Basophils Relative: 1 %
Eosinophils Absolute: 0.1 10*3/uL (ref 0.0–0.5)
Eosinophils Relative: 2 %
HCT: 35.4 % — ABNORMAL LOW (ref 36.0–46.0)
Hemoglobin: 10.9 g/dL — ABNORMAL LOW (ref 12.0–15.0)
Immature Granulocytes: 0 %
Lymphocytes Relative: 29 %
Lymphs Abs: 1.6 10*3/uL (ref 0.7–4.0)
MCH: 31.2 pg (ref 26.0–34.0)
MCHC: 30.8 g/dL (ref 30.0–36.0)
MCV: 101.4 fL — ABNORMAL HIGH (ref 80.0–100.0)
Monocytes Absolute: 0.5 10*3/uL (ref 0.1–1.0)
Monocytes Relative: 10 %
Neutro Abs: 3.2 10*3/uL (ref 1.7–7.7)
Neutrophils Relative %: 58 %
Platelet Count: 213 10*3/uL (ref 150–400)
RBC: 3.49 MIL/uL — ABNORMAL LOW (ref 3.87–5.11)
RDW: 13.8 % (ref 11.5–15.5)
WBC Count: 5.5 10*3/uL (ref 4.0–10.5)
nRBC: 0 % (ref 0.0–0.2)

## 2018-10-04 MED ORDER — EPOETIN ALFA-EPBX 10000 UNIT/ML IJ SOLN
20000.0000 [IU] | Freq: Once | INTRAMUSCULAR | Status: AC
  Administered 2018-10-04: 20000 [IU] via SUBCUTANEOUS
  Filled 2018-10-04: qty 2

## 2018-10-04 NOTE — Progress Notes (Signed)
Hematology and Oncology Follow Up Visit  Rachael Davenport [id number] 1945-04-08 73 y.o. 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 She is doing well and has no complaints at this time.  She denies fever, chills, n/v, cough, rash, dizziness, SOB, chest pain, palpitations, abdominal pain or changes in bowel or bladder habits.  She has issues with constipation so she has not taken an iron supplement.  She has not noted any blood loss. No bruising or petechiae.  No swelling, tenderness, numbness or tingling in her extremities.  She states that she is eating well and staying hydrated. Her weight is stable.   ECOG Performance Status: 1 - Symptomatic but completely ambulatory  Medications:  Allergies as of 10/04/2018      Reactions   Penicillins Itching, Swelling   Has patient had a PCN reaction causing immediate rash, facial/tongue/throat swelling, SOB or lightheadedness with hypotension: Yes Has patient had a PCN reaction causing severe rash involving mucus membranes or skin necrosis: No Has patient had a PCN reaction that required hospitalization: No Has patient had a PCN reaction occurring within the last 10 years: No If all of the above answers are "NO", then may proceed with Cephalosporin use.   Valsartan Other (See Comments), Itching   Heart sped up      Medication List       Accurate as of October 04, 2018  1:55 PM. If you have any questions, ask your nurse or doctor.        aspirin EC 81 MG tablet Take 81 mg by mouth daily.   Fenofibric Acid 135 MG Cpdr fenofibric acid (choline) 135 mg capsule,delayed release   fluticasone 50 MCG/ACT nasal spray Commonly known as: FLONASE U 1 SPRAY IEN BID   Fusion Plus Caps Take 1 capsule by mouth daily.   hydrogen peroxide 3 %  external solution Apply topically.   Imvexxy Maintenance Pack 4 MCG Inst Generic drug: Estradiol Imvexxy Maintenance Pack 4 mcg vaginal insert  Insert 1 vaginal insert twice a week by vaginal route.   olmesartan 20 MG tablet Commonly known as: BENICAR Take 20 mg by mouth daily.   Restasis 0.05 % ophthalmic emulsion Generic drug: cycloSPORINE INT 1 GTT IN OU BID UTD   SUMAtriptan 25 MG tablet Commonly known as: IMITREX Take 25 mg by mouth once as needed for migraine (MAY REPEAT ONCE IN 2 HOURS IF NO RELIEF (MAX 2 TABLETS/24 HOURS)).   Vitamin D (Ergocalciferol) 1.25 MG (50000 UT) Caps capsule Commonly known as: DRISDOL Take 50,000 Units by mouth every 7 (seven) days.   ergocalciferol 1.25 MG (50000 UT) capsule Commonly known as: VITAMIN D2 Take 50,000 Units by mouth once a week.       Allergies:  Allergies  Allergen Reactions  . Penicillins Itching and Swelling    Has patient had a PCN reaction causing immediate rash, facial/tongue/throat swelling, SOB or lightheadedness with hypotension: Yes Has patient had a PCN reaction causing severe rash involving mucus membranes or skin necrosis: No Has patient had a PCN reaction that required hospitalization: No Has patient had a PCN reaction occurring within the last 10 years: No If all of the above answers are "NO", then may proceed with Cephalosporin use.   . Valsartan Other (See Comments) and Itching    Heart sped up  Past Medical History, Surgical history, Social history, and Family History were reviewed and updated.  Review of Systems: All other 10 point review of systems is negative.   Physical Exam:  weight is 121 lb 12.8 oz (55.2 kg). Her oral temperature is 97.8 F (36.6 C). Her blood pressure is 112/51 (abnormal) and her pulse is 82. Her oxygen saturation is 100%.   Wt Readings from Last 3 Encounters:  10/04/18 121 lb 12.8 oz (55.2 kg)  08/23/18 121 lb (54.9 kg)  07/02/18 121 lb (54.9 kg)    Ocular:  Sclerae unicteric, pupils equal, round and reactive to light Ear-nose-throat: Oropharynx clear, dentition fair Lymphatic: No cervical or supraclavicular adenopathy Lungs no rales or rhonchi, good excursion bilaterally Heart regular rate and rhythm, no murmur appreciated Abd soft, nontender, positive bowel sounds, no liver or spleen tip palpated on exam, no fluid wave  MSK no focal spinal tenderness, no joint edema Neuro: non-focal, well-oriented, appropriate affect Breasts: Deferred   Lab Results  Component Value Date   WBC 5.5 10/04/2018   HGB 10.9 (L) 10/04/2018   HCT 35.4 (L) 10/04/2018   MCV 101.4 (H) 10/04/2018   PLT 213 10/04/2018   Lab Results  Component Value Date   FERRITIN 391 (H) 08/23/2018   IRON 105 08/23/2018   TIBC 406 08/23/2018   UIBC 301 08/23/2018   IRONPCTSAT 26 08/23/2018   Lab Results  Component Value Date   RETICCTPCT 0.8 08/23/2018   RBC 3.49 (L) 10/04/2018   No results found for: KPAFRELGTCHN, LAMBDASER, KAPLAMBRATIO No results found for: IGGSERUM, IGA, IGMSERUM No results found for: Kathrynn Ducking, MSPIKE, SPEI   Chemistry      Component Value Date/Time   NA 139 08/23/2018 1509   NA 145 02/13/2017 1046   NA 142 12/23/2016 0951   K 4.6 08/23/2018 1509   K 4.6 02/13/2017 1046   K 3.9 12/23/2016 0951   CL 106 08/23/2018 1509   CL 108 02/13/2017 1046   CO2 25 08/23/2018 1509   CO2 28 02/13/2017 1046   CO2 25 12/23/2016 0951   BUN 27 (H) 08/23/2018 1509   BUN 22 02/13/2017 1046   BUN 15.0 12/23/2016 0951   CREATININE 1.24 (H) 08/23/2018 1509   CREATININE 1.1 02/13/2017 1046   CREATININE 0.9 12/23/2016 0951      Component Value Date/Time   CALCIUM 9.8 08/23/2018 1509   CALCIUM 9.7 02/13/2017 1046   CALCIUM 9.5 12/23/2016 0951   ALKPHOS 28 (L) 08/23/2018 1509   ALKPHOS 60 02/13/2017 1046   ALKPHOS 69 12/23/2016 0951   AST 28 08/23/2018 1509   AST 26 12/23/2016 0951   ALT 20 08/23/2018 1509    ALT 18 02/13/2017 1046   ALT 16 12/23/2016 0951   BILITOT 0.6 08/23/2018 1509   BILITOT 0.96 12/23/2016 0951       Impression and Plan: Rachael Davenport is a very pleasant 73 yo African American female with anemia of chronic renal insufficiency and erythropoietin deficiency.  She received Reticrit today for Hgb 10.9.  We will plan to see her back in another 6 weeks.  She will contact our office with any questions or concerns. We can certainly see her sooner if needed.   Laverna Peace, NP 7/16/20201:55 PM

## 2018-10-04 NOTE — Patient Instructions (Signed)

## 2018-10-05 ENCOUNTER — Telehealth: Payer: Self-pay | Admitting: Family

## 2018-10-05 LAB — IRON AND TIBC
Iron: 99 ug/dL (ref 41–142)
Saturation Ratios: 25 % (ref 21–57)
TIBC: 391 ug/dL (ref 236–444)
UIBC: 293 ug/dL (ref 120–384)

## 2018-10-05 LAB — FERRITIN: Ferritin: 414 ng/mL — ABNORMAL HIGH (ref 11–307)

## 2018-10-05 NOTE — Telephone Encounter (Signed)
Called and spoke with patient regarding appointments added per 7/16 los

## 2018-10-10 ENCOUNTER — Other Ambulatory Visit: Payer: Self-pay | Admitting: Obstetrics and Gynecology

## 2018-10-10 DIAGNOSIS — Z1231 Encounter for screening mammogram for malignant neoplasm of breast: Secondary | ICD-10-CM

## 2018-10-13 ENCOUNTER — Other Ambulatory Visit: Payer: Self-pay

## 2018-10-13 ENCOUNTER — Emergency Department (HOSPITAL_BASED_OUTPATIENT_CLINIC_OR_DEPARTMENT_OTHER)
Admission: EM | Admit: 2018-10-13 | Discharge: 2018-10-13 | Disposition: A | Discharge status: Home / Self Care | Payer: Medicare Other | Attending: Emergency Medicine | Admitting: Emergency Medicine

## 2018-10-13 ENCOUNTER — Encounter (HOSPITAL_BASED_OUTPATIENT_CLINIC_OR_DEPARTMENT_OTHER): Payer: Self-pay | Admitting: Emergency Medicine

## 2018-10-13 DIAGNOSIS — H11432 Conjunctival hyperemia, left eye: Secondary | ICD-10-CM | POA: Diagnosis present

## 2018-10-13 DIAGNOSIS — Z7982 Long term (current) use of aspirin: Secondary | ICD-10-CM | POA: Diagnosis not present

## 2018-10-13 DIAGNOSIS — H1132 Conjunctival hemorrhage, left eye: Secondary | ICD-10-CM | POA: Insufficient documentation

## 2018-10-13 DIAGNOSIS — Z79899 Other long term (current) drug therapy: Secondary | ICD-10-CM | POA: Diagnosis not present

## 2018-10-13 DIAGNOSIS — I10 Essential (primary) hypertension: Secondary | ICD-10-CM | POA: Insufficient documentation

## 2018-10-13 DIAGNOSIS — E119 Type 2 diabetes mellitus without complications: Secondary | ICD-10-CM | POA: Insufficient documentation

## 2018-10-13 MED ORDER — POLYMYXIN B-TRIMETHOPRIM 10000-0.1 UNIT/ML-% OP SOLN: 1.0000 [drp] | OPHTHALMIC | 0 refills | Status: DC

## 2018-10-13 NOTE — ED Provider Notes (Signed)
[facility] Provider Note   CSN: [id number] Arrival date & time: 10/13/18  1208    History   Chief Complaint Chief Complaint  Patient presents with  . Eye redness  . Facial Swelling    HPI Rachael Davenport is a 73 y.o. female.     HPI Patient presents the ED for evaluation of eye irritation.    Patient states her daughter noticed some redness around her left eye the last day or so.  Patient does not recall any particular injuries.  She is not having any pain with this.  She does not notice any change in her visions.  She has not been have any trouble with sneezing or coughing.  She has noticed that the redness seems to be getting worse so she came to the ED for evaluation. Past Medical History:  Diagnosis Date  . Diabetes mellitus without complication ([place])   . Hypertension   . Iron deficiency anemia due to chronic blood loss 07/09/2018    Patient Active Problem List   Diagnosis Date Noted  . Iron deficiency anemia due to chronic blood loss 07/09/2018  . Erythropoietin deficiency anemia 09/18/2016    History reviewed. No pertinent surgical history.   OB History   No obstetric history on file.      Home Medications    Prior to Admission medications   Medication Sig Start Date End Date Taking? Authorizing Provider  aspirin EC 81 MG tablet Take 81 mg by mouth daily.    [provider]  Choline Fenofibrate (FENOFIBRIC ACID) 135 MG CPDR fenofibric acid (choline) 135 mg capsule,delayed release 05/09/17   [provider]  ergocalciferol (VITAMIN D2) 50000 units capsule Take 50,000 Units by mouth once a week. 05/12/15   [provider]  Estradiol (IMVEXXY MAINTENANCE PACK) 4 MCG INST Imvexxy Maintenance Pack 4 mcg vaginal insert  Insert 1 vaginal insert twice a week by vaginal route.    [provider]  fluticasone (FLONASE) 50 MCG/ACT nasal spray U 1 SPRAY IEN BID 04/30/17   [provider]   hydrogen peroxide 3 % external solution Apply topically. 12/18/17   [provider]  Iron-FA-B Cmp-C-Biot-Probiotic (FUSION PLUS) CAPS Take 1 capsule by mouth daily. 08/23/18   Volanda Napoleon, MD  olmesartan (BENICAR) 20 MG tablet Take 20 mg by mouth daily. 05/12/15   [provider]  RESTASIS 0.05 % ophthalmic emulsion INT 1 GTT IN OU BID UTD 04/05/17   [provider]  SUMAtriptan (IMITREX) 25 MG tablet Take 25 mg by mouth once as needed for migraine (MAY REPEAT ONCE IN 2 HOURS IF NO RELIEF (MAX 2 TABLETS/24 HOURS)).  06/08/15   [provider]  trimethoprim-polymyxin b (POLYTRIM) ophthalmic solution Place 1 drop into the left eye every 4 (four) hours. 10/13/18   Dorie Rank, MD  Vitamin D, Ergocalciferol, (DRISDOL) 50000 units CAPS capsule Take 50,000 Units by mouth every 7 (seven) days. 05/12/15   [provider]    Family History History reviewed. No pertinent family history.  Social History Social History   Tobacco Use  . Smoking status: Never Smoker  . Smokeless tobacco: Never Used  Substance Use Topics  . Alcohol use: Never    Frequency: Never  . Drug use: Never     Allergies   Penicillins and Valsartan   Review of Systems Review of Systems  All other systems reviewed and are negative.    Physical Exam Updated Vital Signs BP 133/66 (BP  Location: Left Arm)   Pulse 81   Temp 98.9 F (37.2 C) (Oral)   Resp 18   Ht 1.575 m (5\' 2" )   Wt 54.9 kg   SpO2 99%   BMI 22.13 kg/m   Physical Exam Vitals signs and nursing note reviewed.  Constitutional:      General: She is not in acute distress.    Appearance: She is well-developed.  HENT:     Head: Normocephalic and atraumatic.     Right Ear: External ear normal.     Left Ear: External ear normal.  Eyes:     General: No scleral icterus.       Right eye: No discharge.        Left eye: No discharge.     Extraocular Movements: Extraocular movements intact.      Conjunctiva/sclera:     Left eye: Left conjunctiva is injected. Hemorrhage present.     Pupils: Pupils are equal, round, and reactive to light.     Comments: No drainage, corneas clear,1  Neck:     Musculoskeletal: Neck supple.     Trachea: No tracheal deviation.  Cardiovascular:     Rate and Rhythm: Normal rate.  Pulmonary:     Effort: Pulmonary effort is normal. No respiratory distress.     Breath sounds: No stridor.  Abdominal:     General: There is no distension.  Musculoskeletal:        General: No swelling or deformity.  Skin:    General: Skin is warm and dry.     Findings: No rash.  Neurological:     Mental Status: She is alert.     Cranial Nerves: Cranial nerve deficit: no gross deficits.      ED Treatments / Results  Labs (all labs ordered are listed, but only abnormal results are displayed) Labs Reviewed - No data to display  EKG None  Radiology No results found.  Procedures Procedures (including critical care time)  Medications Ordered in ED Medications - No data to display   Initial Impression / Assessment and Plan / ED Course  I have reviewed the triage vital signs and the nursing notes.  Pertinent labs & imaging results that were available during my care of the patient were reviewed by me and considered in my medical decision making (see chart for details).   Patient's exam is consistent with a subconjunctival hemorrhage.  She does have some erythema of the sclera on the medial portion but most likely is the subconjunctival hemorrhage but I will start a course of antibiotics in case there is a component of conjunctivitis.  Patient visual acuity is normal.  There is no signs of any ulceration.  Discussed outpatient follow-up with an eye doctor as needed but overall expect the subconjunctival hemorrhage to resolve over the next week or 2.  Final Clinical Impressions(s) / ED Diagnoses   Final diagnoses:  Subconjunctival hemorrhage of left eye    ED  Discharge Orders         Ordered    trimethoprim-polymyxin b (POLYTRIM) ophthalmic solution  Every 4 hours     10/13/18 1428           Dorie Rank, MD 10/13/18 1432

## 2018-10-13 NOTE — ED Triage Notes (Signed)
Pt here with left eye swelling and redness since last night around 1930.

## 2018-10-13 NOTE — Discharge Instructions (Signed)
Use the antibiotic eyedrops as prescribed.  You can also supplement with over-the-counter lubricating eyedrops for eye irritation.  It may take a couple of weeks for the eye redness to resolve.  Follow-up with an eye doctor if you start having difficulty with your vision, drainage or pain.

## 2018-11-15 ENCOUNTER — Ambulatory Visit: Payer: Medicare Other

## 2018-11-15 ENCOUNTER — Other Ambulatory Visit: Payer: Medicare Other

## 2018-11-15 ENCOUNTER — Ambulatory Visit: Payer: Medicare Other | Admitting: Family

## 2018-11-19 ENCOUNTER — Inpatient Hospital Stay: Payer: Medicare Other

## 2018-11-19 ENCOUNTER — Inpatient Hospital Stay: Payer: Medicare Other | Attending: Family

## 2018-11-19 ENCOUNTER — Inpatient Hospital Stay (HOSPITAL_BASED_OUTPATIENT_CLINIC_OR_DEPARTMENT_OTHER): Payer: Medicare Other | Admitting: Family

## 2018-11-19 ENCOUNTER — Other Ambulatory Visit: Payer: Self-pay

## 2018-11-19 ENCOUNTER — Encounter: Payer: Self-pay | Admitting: Family

## 2018-11-19 VITALS — BP 113/52 | HR 77 | Temp 98.0°F | Resp 18 | Ht 62.0 in | Wt 124.0 lb

## 2018-11-19 DIAGNOSIS — Z79899 Other long term (current) drug therapy: Secondary | ICD-10-CM | POA: Diagnosis not present

## 2018-11-19 DIAGNOSIS — D631 Anemia in chronic kidney disease: Secondary | ICD-10-CM

## 2018-11-19 DIAGNOSIS — D500 Iron deficiency anemia secondary to blood loss (chronic): Secondary | ICD-10-CM

## 2018-11-19 DIAGNOSIS — N189 Chronic kidney disease, unspecified: Secondary | ICD-10-CM | POA: Insufficient documentation

## 2018-11-19 DIAGNOSIS — D508 Other iron deficiency anemias: Secondary | ICD-10-CM

## 2018-11-19 DIAGNOSIS — I129 Hypertensive chronic kidney disease with stage 1 through stage 4 chronic kidney disease, or unspecified chronic kidney disease: Secondary | ICD-10-CM | POA: Diagnosis not present

## 2018-11-19 LAB — CMP (CANCER CENTER ONLY)
ALT: 21 U/L (ref 0–44)
AST: 31 U/L (ref 15–41)
Albumin: 4.2 g/dL (ref 3.5–5.0)
Alkaline Phosphatase: 27 U/L — ABNORMAL LOW (ref 38–126)
Anion gap: 8 (ref 5–15)
BUN: 30 mg/dL — ABNORMAL HIGH (ref 8–23)
CO2: 27 mmol/L (ref 22–32)
Calcium: 9.7 mg/dL (ref 8.9–10.3)
Chloride: 104 mmol/L (ref 98–111)
Creatinine: 1.28 mg/dL — ABNORMAL HIGH (ref 0.44–1.00)
GFR, Est AFR Am: 48 mL/min — ABNORMAL LOW (ref >60)
GFR, Estimated: 41 mL/min — ABNORMAL LOW (ref >60)
Glucose, Bld: 84 mg/dL (ref 70–99)
Potassium: 4.3 mmol/L (ref 3.5–5.1)
Sodium: 139 mmol/L (ref 135–145)
Total Bilirubin: 0.5 mg/dL (ref 0.3–1.2)
Total Protein: 7.1 g/dL (ref 6.5–8.1)

## 2018-11-19 LAB — CBC WITH DIFFERENTIAL (CANCER CENTER ONLY)
Abs Immature Granulocytes: 0.01 10*3/uL (ref 0.00–0.07)
Basophils Absolute: 0.1 10*3/uL (ref 0.0–0.1)
Basophils Relative: 1 %
Eosinophils Absolute: 0.2 10*3/uL (ref 0.0–0.5)
Eosinophils Relative: 3 %
HCT: 28.7 % — ABNORMAL LOW (ref 36.0–46.0)
Hemoglobin: 9.1 g/dL — ABNORMAL LOW (ref 12.0–15.0)
Immature Granulocytes: 0 %
Lymphocytes Relative: 27 %
Lymphs Abs: 1.5 10*3/uL (ref 0.7–4.0)
MCH: 31.7 pg (ref 26.0–34.0)
MCHC: 31.7 g/dL (ref 30.0–36.0)
MCV: 100 fL (ref 80.0–100.0)
Monocytes Absolute: 0.5 10*3/uL (ref 0.1–1.0)
Monocytes Relative: 9 %
Neutro Abs: 3.5 10*3/uL (ref 1.7–7.7)
Neutrophils Relative %: 60 %
Platelet Count: 185 10*3/uL (ref 150–400)
RBC: 2.87 MIL/uL — ABNORMAL LOW (ref 3.87–5.11)
RDW: 13.1 % (ref 11.5–15.5)
WBC Count: 5.8 10*3/uL (ref 4.0–10.5)
nRBC: 0 % (ref 0.0–0.2)

## 2018-11-19 LAB — RETICULOCYTES
Immature Retic Fract: 9.2 % (ref 2.3–15.9)
RBC.: 2.89 MIL/uL — ABNORMAL LOW (ref 3.87–5.11)
Retic Count, Absolute: 41.6 10*3/uL (ref 19.0–186.0)
Retic Ct Pct: 1.4 % (ref 0.4–3.1)

## 2018-11-19 MED ORDER — EPOETIN ALFA-EPBX 40000 UNIT/ML IJ SOLN
40000.0000 [IU] | Freq: Once | INTRAMUSCULAR | Status: AC
  Administered 2018-11-19: 16:00:00 40000 [IU] via SUBCUTANEOUS
  Filled 2018-11-19: qty 1

## 2018-11-19 NOTE — Patient Instructions (Signed)

## 2018-11-19 NOTE — Progress Notes (Signed)
Hematology and Oncology Follow Up Visit  Rachael Davenport [id number] 1945-04-26 73 y.o. 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 She is  Doing well but has occasional fatigue. She has also noted chills.  No fever, n/v, cough, rash, dizziness, SOB, chest pain, palpitations, abdominal pain or changes in bowel or bladder habits.  She has intermittent constipation and will note a pink tinge on her toilet tissue when she strains. She drinks smooth move tea when needed.  No bleeding, bruising or petechiae to report.  No swelling or tenderness in her extremities. She has occasional tingling in her right or left hands off and on.   ECOG Performance Status: 1 - Symptomatic but completely ambulatory  Medications:  Allergies as of 11/19/2018      Reactions   Penicillins Itching, Swelling   Has patient had a PCN reaction causing immediate rash, facial/tongue/throat swelling, SOB or lightheadedness with hypotension: Yes Has patient had a PCN reaction causing severe rash involving mucus membranes or skin necrosis: No Has patient had a PCN reaction that required hospitalization: No Has patient had a PCN reaction occurring within the last 10 years: No If all of the above answers are "NO", then may proceed with Cephalosporin use.   Valsartan Other (See Comments), Itching   Heart sped up      Medication List       Accurate as of November 19, 2018  3:07 PM. If you have any questions, ask your nurse or doctor.        aspirin EC 81 MG tablet Take 81 mg by mouth daily.   Fenofibric Acid 135 MG Cpdr fenofibric acid (choline) 135 mg capsule,delayed release   fluticasone 50 MCG/ACT nasal spray Commonly known as: FLONASE U 1 SPRAY IEN BID   Fusion Plus Caps Take 1 capsule by mouth daily.    hydrogen peroxide 3 % external solution Apply topically.   Imvexxy Maintenance Pack 4 MCG Inst Generic drug: Estradiol Imvexxy Maintenance Pack 4 mcg vaginal insert  Insert 1 vaginal insert twice a week by vaginal route.   olmesartan 20 MG tablet Commonly known as: BENICAR Take 20 mg by mouth daily.   Restasis 0.05 % ophthalmic emulsion Generic drug: cycloSPORINE INT 1 GTT IN OU BID UTD   SUMAtriptan 25 MG tablet Commonly known as: IMITREX Take 25 mg by mouth once as needed for migraine (MAY REPEAT ONCE IN 2 HOURS IF NO RELIEF (MAX 2 TABLETS/24 HOURS)).   trimethoprim-polymyxin b ophthalmic solution Commonly known as: Polytrim Place 1 drop into the left eye every 4 (four) hours.   Vitamin D (Ergocalciferol) 1.25 MG (50000 UT) Caps capsule Commonly known as: DRISDOL Take 50,000 Units by mouth every 7 (seven) days.   ergocalciferol 1.25 MG (50000 UT) capsule Commonly known as: VITAMIN D2 Take 50,000 Units by mouth once a week.       Allergies:  Allergies  Allergen Reactions  . Penicillins Itching and Swelling    Has patient had a PCN reaction causing immediate rash, facial/tongue/throat swelling, SOB or lightheadedness with hypotension: Yes Has patient had a PCN reaction causing severe rash involving mucus membranes or skin necrosis: No Has patient had a PCN reaction that required hospitalization: No Has patient had a PCN reaction occurring within the last 10 years: No If all of the  above answers are "NO", then may proceed with Cephalosporin use.   . Valsartan Other (See Comments) and Itching    Heart sped up    Past Medical History, Surgical history, Social history, and Family History were reviewed and updated.  Review of Systems: All other 10 point review of systems is negative.   Physical Exam:  vitals were not taken for this visit.   Wt Readings from Last 3 Encounters:  10/13/18 121 lb (54.9 kg)  10/04/18 121 lb 12.8 oz (55.2 kg)  08/23/18 121 lb (54.9  kg)    Ocular: Sclerae unicteric, pupils equal, round and reactive to light Ear-nose-throat: Oropharynx clear, dentition fair Lymphatic: No cervical or supraclavicular adenopathy Lungs no rales or rhonchi, good excursion bilaterally Heart regular rate and rhythm, no murmur appreciated Abd soft, nontender, positive bowel sounds, no liver or spleen tip palpated on exam, no fluid wave  MSK no focal spinal tenderness, no joint edema Neuro: non-focal, well-oriented, appropriate affect Breasts: Deferred   Lab Results  Component Value Date   WBC 5.8 11/19/2018   HGB 9.1 (L) 11/19/2018   HCT 28.7 (L) 11/19/2018   MCV 100.0 11/19/2018   PLT 185 11/19/2018   Lab Results  Component Value Date   FERRITIN 414 (H) 10/04/2018   IRON 99 10/04/2018   TIBC 391 10/04/2018   UIBC 293 10/04/2018   IRONPCTSAT 25 10/04/2018   Lab Results  Component Value Date   RETICCTPCT 1.4 11/19/2018   RBC 2.89 (L) 11/19/2018   RBC 2.87 (L) 11/19/2018   No results found for: KPAFRELGTCHN, LAMBDASER, KAPLAMBRATIO No results found for: IGGSERUM, IGA, IGMSERUM No results found for: Kathrynn Ducking, MSPIKE, SPEI   Chemistry      Component Value Date/Time   NA 140 10/04/2018 1329   NA 145 02/13/2017 1046   NA 142 12/23/2016 0951   K 5.6 (H) 10/04/2018 1329   K 4.6 02/13/2017 1046   K 3.9 12/23/2016 0951   CL 108 10/04/2018 1329   CL 108 02/13/2017 1046   CO2 26 10/04/2018 1329   CO2 28 02/13/2017 1046   CO2 25 12/23/2016 0951   BUN 34 (H) 10/04/2018 1329   BUN 22 02/13/2017 1046   BUN 15.0 12/23/2016 0951   CREATININE 1.48 (H) 10/04/2018 1329   CREATININE 1.1 02/13/2017 1046   CREATININE 0.9 12/23/2016 0951      Component Value Date/Time   CALCIUM 9.2 10/04/2018 1329   CALCIUM 9.7 02/13/2017 1046   CALCIUM 9.5 12/23/2016 0951   ALKPHOS 28 (L) 10/04/2018 1329   ALKPHOS 60 02/13/2017 1046   ALKPHOS 69 12/23/2016 0951   AST 29 10/04/2018 1329   AST 26  12/23/2016 0951   ALT 22 10/04/2018 1329   ALT 18 02/13/2017 1046   ALT 16 12/23/2016 0951   BILITOT 0.6 10/04/2018 1329   BILITOT 0.96 12/23/2016 0951       Impression and Plan: Rachael Davenport is a very pleasant 73 yo African American female with anemia of chronic renal insufficiency and erythropoietin deficiency.  She received Reticrit today for Hgb 9.1. We did increase her dose to 40,000 units due to the lower Hgb.  We will plan to see her back in another month for follow-up.  She will contact our office with any questions or concerns. We can certainly see her sooner if needed.   Laverna Peace, NP 8/31/20203:07 PM

## 2018-11-20 LAB — IRON AND TIBC
Iron: 89 ug/dL (ref 41–142)
Saturation Ratios: 24 % (ref 21–57)
TIBC: 370 ug/dL (ref 236–444)
UIBC: 281 ug/dL (ref 120–384)

## 2018-11-20 LAB — FERRITIN: Ferritin: 455 ng/mL — ABNORMAL HIGH (ref 11–307)

## 2018-11-27 ENCOUNTER — Other Ambulatory Visit: Payer: Self-pay

## 2018-11-27 ENCOUNTER — Ambulatory Visit
Admission: RE | Admit: 2018-11-27 | Discharge: 2018-11-27 | Disposition: A | Discharge status: Home / Self Care | Payer: Medicare Other | Source: Ambulatory Visit | Attending: Obstetrics and Gynecology | Admitting: Obstetrics and Gynecology

## 2018-11-27 DIAGNOSIS — Z1231 Encounter for screening mammogram for malignant neoplasm of breast: Secondary | ICD-10-CM

## 2018-12-27 ENCOUNTER — Inpatient Hospital Stay: Payer: Medicare Other

## 2018-12-27 ENCOUNTER — Inpatient Hospital Stay (HOSPITAL_BASED_OUTPATIENT_CLINIC_OR_DEPARTMENT_OTHER): Payer: Medicare Other | Admitting: Family

## 2018-12-27 ENCOUNTER — Encounter: Payer: Self-pay | Admitting: Family

## 2018-12-27 ENCOUNTER — Other Ambulatory Visit: Payer: Self-pay

## 2018-12-27 ENCOUNTER — Inpatient Hospital Stay: Payer: Medicare Other | Attending: Family

## 2018-12-27 VITALS — BP 118/59 | HR 83 | Temp 97.5°F | Resp 18 | Ht 62.0 in | Wt 119.1 lb

## 2018-12-27 DIAGNOSIS — D500 Iron deficiency anemia secondary to blood loss (chronic): Secondary | ICD-10-CM

## 2018-12-27 DIAGNOSIS — Z7982 Long term (current) use of aspirin: Secondary | ICD-10-CM | POA: Insufficient documentation

## 2018-12-27 DIAGNOSIS — Z79899 Other long term (current) drug therapy: Secondary | ICD-10-CM | POA: Diagnosis not present

## 2018-12-27 DIAGNOSIS — D631 Anemia in chronic kidney disease: Secondary | ICD-10-CM | POA: Diagnosis not present

## 2018-12-27 DIAGNOSIS — I129 Hypertensive chronic kidney disease with stage 1 through stage 4 chronic kidney disease, or unspecified chronic kidney disease: Secondary | ICD-10-CM | POA: Insufficient documentation

## 2018-12-27 DIAGNOSIS — R5383 Other fatigue: Secondary | ICD-10-CM | POA: Diagnosis not present

## 2018-12-27 DIAGNOSIS — D508 Other iron deficiency anemias: Secondary | ICD-10-CM

## 2018-12-27 DIAGNOSIS — N189 Chronic kidney disease, unspecified: Secondary | ICD-10-CM

## 2018-12-27 LAB — CMP (CANCER CENTER ONLY)
ALT: 22 U/L (ref 0–44)
AST: 32 U/L (ref 15–41)
Albumin: 4.5 g/dL (ref 3.5–5.0)
Alkaline Phosphatase: 27 U/L — ABNORMAL LOW (ref 38–126)
Anion gap: 8 (ref 5–15)
BUN: 24 mg/dL — ABNORMAL HIGH (ref 8–23)
CO2: 25 mmol/L (ref 22–32)
Calcium: 9.7 mg/dL (ref 8.9–10.3)
Chloride: 105 mmol/L (ref 98–111)
Creatinine: 1.3 mg/dL — ABNORMAL HIGH (ref 0.44–1.00)
GFR, Est AFR Am: 47 mL/min — ABNORMAL LOW (ref >60)
GFR, Estimated: 40 mL/min — ABNORMAL LOW (ref >60)
Glucose, Bld: 98 mg/dL (ref 70–99)
Potassium: 4.3 mmol/L (ref 3.5–5.1)
Sodium: 138 mmol/L (ref 135–145)
Total Bilirubin: 0.7 mg/dL (ref 0.3–1.2)
Total Protein: 7.6 g/dL (ref 6.5–8.1)

## 2018-12-27 LAB — RETICULOCYTES
Immature Retic Fract: 7.5 % (ref 2.3–15.9)
RBC.: 2.91 MIL/uL — ABNORMAL LOW (ref 3.87–5.11)
Retic Count, Absolute: 40.7 10*3/uL (ref 19.0–186.0)
Retic Ct Pct: 1.4 % (ref 0.4–3.1)

## 2018-12-27 LAB — CBC WITH DIFFERENTIAL (CANCER CENTER ONLY)
Abs Immature Granulocytes: 0.03 10*3/uL (ref 0.00–0.07)
Basophils Absolute: 0.1 10*3/uL (ref 0.0–0.1)
Basophils Relative: 1 %
Eosinophils Absolute: 0.1 10*3/uL (ref 0.0–0.5)
Eosinophils Relative: 3 %
HCT: 29 % — ABNORMAL LOW (ref 36.0–46.0)
Hemoglobin: 9.1 g/dL — ABNORMAL LOW (ref 12.0–15.0)
Immature Granulocytes: 1 %
Lymphocytes Relative: 30 %
Lymphs Abs: 1.6 10*3/uL (ref 0.7–4.0)
MCH: 31.5 pg (ref 26.0–34.0)
MCHC: 31.4 g/dL (ref 30.0–36.0)
MCV: 100.3 fL — ABNORMAL HIGH (ref 80.0–100.0)
Monocytes Absolute: 0.5 10*3/uL (ref 0.1–1.0)
Monocytes Relative: 10 %
Neutro Abs: 3 10*3/uL (ref 1.7–7.7)
Neutrophils Relative %: 55 %
Platelet Count: 216 10*3/uL (ref 150–400)
RBC: 2.89 MIL/uL — ABNORMAL LOW (ref 3.87–5.11)
RDW: 13.1 % (ref 11.5–15.5)
WBC Count: 5.3 10*3/uL (ref 4.0–10.5)
nRBC: 0 % (ref 0.0–0.2)

## 2018-12-27 MED ORDER — EPOETIN ALFA-EPBX 40000 UNIT/ML IJ SOLN
40000.0000 [IU] | Freq: Once | INTRAMUSCULAR | Status: AC
  Administered 2018-12-27: 40000 [IU] via SUBCUTANEOUS
  Filled 2018-12-27: qty 1

## 2018-12-27 NOTE — Progress Notes (Signed)
Hematology and Oncology Follow Up Visit  Rachael Davenport [id number] 07-19-1945 73 y.o. 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 She is doing well but still has fatigue.  Hgb is 9.1, MCV 100.  She will sometimes strain with BMs and will note a pink tinge on her toilet tissue. No other blood loss noted. No bruising or petechiae.  No fever, chills, n/v, cough, rash, dizziness, SOB, chest pain, palpitations, abdominal pain or changes in bowel or bladder habits.  No swelling or tenderness in her extremities.  She has numbness and tingling in her right hand that comes and goes.  She states that she has a good appetite and is staying well hydrated. Her weight is down 5 lbs at 119 lbs. She states that she will try carnation instant breakfast.   ECOG Performance Status: 1 - Symptomatic but completely ambulatory  Medications:  Allergies as of 12/27/2018      Reactions   Penicillins Itching, Swelling   Has patient had a PCN reaction causing immediate rash, facial/tongue/throat swelling, SOB or lightheadedness with hypotension: Yes Has patient had a PCN reaction causing severe rash involving mucus membranes or skin necrosis: No Has patient had a PCN reaction that required hospitalization: No Has patient had a PCN reaction occurring within the last 10 years: No If all of the above answers are "NO", then may proceed with Cephalosporin use.   Valsartan Other (See Comments), Itching   Heart sped up      Medication List       Accurate as of December 27, 2018  3:33 PM. If you have any questions, ask your nurse or doctor.        aspirin EC 81 MG tablet Take 81 mg by mouth daily.   Fenofibric Acid 135 MG Cpdr fenofibric acid (choline) 135 mg capsule,delayed release   fluticasone 50 MCG/ACT nasal  spray Commonly known as: FLONASE U 1 SPRAY IEN BID   Fusion Plus Caps Take 1 capsule by mouth daily.   Imvexxy Maintenance Pack 4 MCG Inst Generic drug: Estradiol Imvexxy Maintenance Pack 4 mcg vaginal insert  Insert 1 vaginal insert twice a week by vaginal route.   olmesartan 20 MG tablet Commonly known as: BENICAR Take 20 mg by mouth daily.   Restasis 0.05 % ophthalmic emulsion Generic drug: cycloSPORINE INT 1 GTT IN OU BID UTD   SUMAtriptan 25 MG tablet Commonly known as: IMITREX Take 25 mg by mouth once as needed for migraine (MAY REPEAT ONCE IN 2 HOURS IF NO RELIEF (MAX 2 TABLETS/24 HOURS)).   trimethoprim-polymyxin b ophthalmic solution Commonly known as: Polytrim Place 1 drop into the left eye every 4 (four) hours.   Vitamin D (Ergocalciferol) 1.25 MG (50000 UT) Caps capsule Commonly known as: DRISDOL Take 50,000 Units by mouth every 7 (seven) days.   ergocalciferol 1.25 MG (50000 UT) capsule Commonly known as: VITAMIN D2 Take 50,000 Units by mouth once a week.       Allergies:  Allergies  Allergen Reactions  . Penicillins Itching and Swelling    Has patient had a PCN reaction causing immediate rash, facial/tongue/throat swelling, SOB or lightheadedness with hypotension: Yes Has patient had a PCN reaction causing severe rash involving mucus membranes or skin necrosis: No Has patient had a PCN reaction that required hospitalization: No Has patient  had a PCN reaction occurring within the last 10 years: No If all of the above answers are "NO", then may proceed with Cephalosporin use.   . Valsartan Other (See Comments) and Itching    Heart sped up    Past Medical History, Surgical history, Social history, and Family History were reviewed and updated.  Review of Systems: All other 10 point review of systems is negative.   Physical Exam:  vitals were not taken for this visit.   Wt Readings from Last 3 Encounters:  11/19/18 124 lb (56.2 kg)  10/13/18 121  lb (54.9 kg)  10/04/18 121 lb 12.8 oz (55.2 kg)    Ocular: Sclerae unicteric, pupils equal, round and reactive to light Ear-nose-throat: Oropharynx clear, dentition fair Lymphatic: No cervical or supraclavicular adenopathy Lungs no rales or rhonchi, good excursion bilaterally Heart regular rate and rhythm, no murmur appreciated Abd soft, nontender, positive bowel sounds, no liver or spleen tip palpated on exam, no fluid wave  MSK no focal spinal tenderness, no joint edema Neuro: non-focal, well-oriented, appropriate affect Breasts: Deferred   Lab Results  Component Value Date   WBC 5.3 12/27/2018   HGB 9.1 (L) 12/27/2018   HCT 29.0 (L) 12/27/2018   MCV 100.3 (H) 12/27/2018   PLT 216 12/27/2018   Lab Results  Component Value Date   FERRITIN 455 (H) 11/19/2018   IRON 89 11/19/2018   TIBC 370 11/19/2018   UIBC 281 11/19/2018   IRONPCTSAT 24 11/19/2018   Lab Results  Component Value Date   RETICCTPCT 1.4 12/27/2018   RBC 2.89 (L) 12/27/2018   RBC 2.91 (L) 12/27/2018   No results found for: KPAFRELGTCHN, LAMBDASER, KAPLAMBRATIO No results found for: IGGSERUM, IGA, IGMSERUM No results found for: Kathrynn Ducking, MSPIKE, SPEI   Chemistry      Component Value Date/Time   NA 139 11/19/2018 1445   NA 145 02/13/2017 1046   NA 142 12/23/2016 0951   K 4.3 11/19/2018 1445   K 4.6 02/13/2017 1046   K 3.9 12/23/2016 0951   CL 104 11/19/2018 1445   CL 108 02/13/2017 1046   CO2 27 11/19/2018 1445   CO2 28 02/13/2017 1046   CO2 25 12/23/2016 0951   BUN 30 (H) 11/19/2018 1445   BUN 22 02/13/2017 1046   BUN 15.0 12/23/2016 0951   CREATININE 1.28 (H) 11/19/2018 1445   CREATININE 1.1 02/13/2017 1046   CREATININE 0.9 12/23/2016 0951      Component Value Date/Time   CALCIUM 9.7 11/19/2018 1445   CALCIUM 9.7 02/13/2017 1046   CALCIUM 9.5 12/23/2016 0951   ALKPHOS 27 (L) 11/19/2018 1445   ALKPHOS 60 02/13/2017 1046   ALKPHOS 69  12/23/2016 0951   AST 31 11/19/2018 1445   AST 26 12/23/2016 0951   ALT 21 11/19/2018 1445   ALT 18 02/13/2017 1046   ALT 16 12/23/2016 0951   BILITOT 0.5 11/19/2018 1445   BILITOT 0.96 12/23/2016 0951       Impression and Plan: Ms. Foulks is a very pleasant 73 yo African American female with anemia of chronic renal insufficiency and erythropoietin deficiency. She received Retacrit today for Hgb 9.1.  We will plan to see her back in another month.  She will contact our office with any questions or concerns. We can certainly see her sooner if needed.   Laverna Peace, NP 10/8/20203:33 PM

## 2018-12-27 NOTE — Patient Instructions (Signed)

## 2018-12-28 ENCOUNTER — Telehealth: Payer: Self-pay | Admitting: Family

## 2018-12-28 LAB — IRON AND TIBC
Iron: 93 ug/dL (ref 41–142)
Saturation Ratios: 25 % (ref 21–57)
TIBC: 378 ug/dL (ref 236–444)
UIBC: 284 ug/dL (ref 120–384)

## 2018-12-28 LAB — FERRITIN: Ferritin: 487 ng/mL — ABNORMAL HIGH (ref 11–307)

## 2018-12-28 NOTE — Telephone Encounter (Signed)
Appointments scheduled patient notified calendar mailed per 10/8 los

## 2019-01-28 ENCOUNTER — Other Ambulatory Visit: Payer: Medicare Other

## 2019-01-28 ENCOUNTER — Ambulatory Visit: Payer: Medicare Other

## 2019-01-28 ENCOUNTER — Ambulatory Visit: Payer: Medicare Other | Admitting: Family

## 2019-02-05 ENCOUNTER — Other Ambulatory Visit: Payer: Self-pay

## 2019-02-05 ENCOUNTER — Inpatient Hospital Stay: Payer: Medicare Other

## 2019-02-05 ENCOUNTER — Inpatient Hospital Stay (HOSPITAL_BASED_OUTPATIENT_CLINIC_OR_DEPARTMENT_OTHER): Payer: Medicare Other | Admitting: Family

## 2019-02-05 ENCOUNTER — Inpatient Hospital Stay: Payer: Medicare Other | Attending: Family

## 2019-02-05 VITALS — BP 122/50 | HR 80 | Temp 97.3°F | Resp 17 | Wt 119.0 lb

## 2019-02-05 DIAGNOSIS — R202 Paresthesia of skin: Secondary | ICD-10-CM | POA: Insufficient documentation

## 2019-02-05 DIAGNOSIS — D500 Iron deficiency anemia secondary to blood loss (chronic): Secondary | ICD-10-CM

## 2019-02-05 DIAGNOSIS — M50323 Other cervical disc degeneration at C6-C7 level: Secondary | ICD-10-CM | POA: Diagnosis not present

## 2019-02-05 DIAGNOSIS — Z79899 Other long term (current) drug therapy: Secondary | ICD-10-CM | POA: Insufficient documentation

## 2019-02-05 DIAGNOSIS — N189 Chronic kidney disease, unspecified: Secondary | ICD-10-CM | POA: Diagnosis not present

## 2019-02-05 DIAGNOSIS — D509 Iron deficiency anemia, unspecified: Secondary | ICD-10-CM | POA: Diagnosis not present

## 2019-02-05 DIAGNOSIS — D631 Anemia in chronic kidney disease: Secondary | ICD-10-CM | POA: Insufficient documentation

## 2019-02-05 DIAGNOSIS — Z7982 Long term (current) use of aspirin: Secondary | ICD-10-CM | POA: Diagnosis not present

## 2019-02-05 DIAGNOSIS — R200 Anesthesia of skin: Secondary | ICD-10-CM | POA: Insufficient documentation

## 2019-02-05 DIAGNOSIS — R5383 Other fatigue: Secondary | ICD-10-CM | POA: Diagnosis not present

## 2019-02-05 DIAGNOSIS — I129 Hypertensive chronic kidney disease with stage 1 through stage 4 chronic kidney disease, or unspecified chronic kidney disease: Secondary | ICD-10-CM | POA: Diagnosis present

## 2019-02-05 DIAGNOSIS — M7989 Other specified soft tissue disorders: Secondary | ICD-10-CM | POA: Diagnosis not present

## 2019-02-05 LAB — CMP (CANCER CENTER ONLY)
ALT: 21 U/L (ref 0–44)
AST: 29 U/L (ref 15–41)
Albumin: 4.3 g/dL (ref 3.5–5.0)
Alkaline Phosphatase: 26 U/L — ABNORMAL LOW (ref 38–126)
Anion gap: 8 (ref 5–15)
BUN: 30 mg/dL — ABNORMAL HIGH (ref 8–23)
CO2: 27 mmol/L (ref 22–32)
Calcium: 9.6 mg/dL (ref 8.9–10.3)
Chloride: 109 mmol/L (ref 98–111)
Creatinine: 1.5 mg/dL — ABNORMAL HIGH (ref 0.44–1.00)
GFR, Est AFR Am: 39 mL/min — ABNORMAL LOW (ref >60)
GFR, Estimated: 34 mL/min — ABNORMAL LOW (ref >60)
Glucose, Bld: 120 mg/dL — ABNORMAL HIGH (ref 70–99)
Potassium: 4 mmol/L (ref 3.5–5.1)
Sodium: 144 mmol/L (ref 135–145)
Total Bilirubin: 0.6 mg/dL (ref 0.3–1.2)
Total Protein: 6.9 g/dL (ref 6.5–8.1)

## 2019-02-05 LAB — CBC WITH DIFFERENTIAL (CANCER CENTER ONLY)
Abs Immature Granulocytes: 0.04 10*3/uL (ref 0.00–0.07)
Basophils Absolute: 0 10*3/uL (ref 0.0–0.1)
Basophils Relative: 1 %
Eosinophils Absolute: 0.2 10*3/uL (ref 0.0–0.5)
Eosinophils Relative: 3 %
HCT: 28.1 % — ABNORMAL LOW (ref 36.0–46.0)
Hemoglobin: 8.7 g/dL — ABNORMAL LOW (ref 12.0–15.0)
Immature Granulocytes: 1 %
Lymphocytes Relative: 29 %
Lymphs Abs: 1.4 10*3/uL (ref 0.7–4.0)
MCH: 31.6 pg (ref 26.0–34.0)
MCHC: 31 g/dL (ref 30.0–36.0)
MCV: 102.2 fL — ABNORMAL HIGH (ref 80.0–100.0)
Monocytes Absolute: 0.4 10*3/uL (ref 0.1–1.0)
Monocytes Relative: 8 %
Neutro Abs: 2.9 10*3/uL (ref 1.7–7.7)
Neutrophils Relative %: 58 %
Platelet Count: 203 10*3/uL (ref 150–400)
RBC: 2.75 MIL/uL — ABNORMAL LOW (ref 3.87–5.11)
RDW: 13.2 % (ref 11.5–15.5)
WBC Count: 5 10*3/uL (ref 4.0–10.5)
nRBC: 0 % (ref 0.0–0.2)

## 2019-02-05 LAB — RETICULOCYTES
Immature Retic Fract: 8.5 % (ref 2.3–15.9)
RBC.: 2.74 MIL/uL — ABNORMAL LOW (ref 3.87–5.11)
Retic Count, Absolute: 27.4 10*3/uL (ref 19.0–186.0)
Retic Ct Pct: 1 % (ref 0.4–3.1)

## 2019-02-05 MED ORDER — EPOETIN ALFA-EPBX 40000 UNIT/ML IJ SOLN
40000.0000 [IU] | Freq: Once | INTRAMUSCULAR | Status: AC
  Administered 2019-02-05: 40000 [IU] via SUBCUTANEOUS

## 2019-02-05 MED ORDER — EPOETIN ALFA-EPBX 40000 UNIT/ML IJ SOLN
INTRAMUSCULAR | Status: AC
  Filled 2019-02-05: qty 1

## 2019-02-05 NOTE — Progress Notes (Signed)
Hematology and Oncology Follow Up Visit  Rachael Davenport [id number] January 11, 1946 73 y.o. 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  She has fatigue off and on.  No bleeding, bruising or petechiae.  No fever, chills, n/v, cough, rash, dizziness, SOB, chest pain, palpitations, abdominal pain or changes in bowel or bladder habits.  No tenderness in her extremities.  She has chronic swelling in her left ankle that waxes and wanes. She states that this is likely due to arthritis and she sometimes wears a brace if needed.  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  No falls or syncope.  She has maintained a good appetite and is staying well hydrated. Her weight is stable.   ECOG Performance Status: 1 - Symptomatic but completely ambulatory  Medications:  Allergies as of 02/05/2019      Reactions   Penicillins Itching, Swelling   Has patient had a PCN reaction causing immediate rash, facial/tongue/throat swelling, SOB or lightheadedness with hypotension: Yes Has patient had a PCN reaction causing severe rash involving mucus membranes or skin necrosis: No Has patient had a PCN reaction that required hospitalization: No Has patient had a PCN reaction occurring within the last 10 years: No If all of the above answers are "NO", then may proceed with Cephalosporin use.   Valsartan Other (See Comments), Itching   Heart sped up      Medication List       Accurate as of February 05, 2019  3:11 PM. If you have any questions, ask your nurse or doctor.        aspirin EC 81 MG tablet Take 81 mg by mouth daily.   Fenofibric Acid 135 MG Cpdr fenofibric acid  (choline) 135 mg capsule,delayed release   fluticasone 50 MCG/ACT nasal spray Commonly known as: FLONASE U 1 SPRAY IEN BID   Fusion Plus Caps Take 1 capsule by mouth daily.   Imvexxy Maintenance Pack 4 MCG Inst Generic drug: Estradiol Imvexxy Maintenance Pack 4 mcg vaginal insert  Insert 1 vaginal insert twice a week by vaginal route.   olmesartan 20 MG tablet Commonly known as: BENICAR Take 20 mg by mouth daily.   Restasis 0.05 % ophthalmic emulsion Generic drug: cycloSPORINE INT 1 GTT IN OU BID UTD   SUMAtriptan 25 MG tablet Commonly known as: IMITREX Take 25 mg by mouth once as needed for migraine (MAY REPEAT ONCE IN 2 HOURS IF NO RELIEF (MAX 2 TABLETS/24 HOURS)).   trimethoprim-polymyxin b ophthalmic solution Commonly known as: Polytrim Place 1 drop into the left eye every 4 (four) hours.   Vitamin D (Ergocalciferol) 1.25 MG (50000 UT) Caps capsule Commonly known as: DRISDOL Take 50,000 Units by mouth every 7 (seven) days.   ergocalciferol 1.25 MG (50000 UT) capsule Commonly known as: VITAMIN D2 Take 50,000 Units by mouth once a week.       Allergies:  Allergies  Allergen Reactions  . Penicillins Itching and Swelling    Has patient had a PCN reaction causing immediate rash, facial/tongue/throat swelling, SOB or lightheadedness with hypotension: Yes Has patient had a PCN reaction causing severe rash involving mucus membranes or skin necrosis: No Has patient had a PCN reaction that required hospitalization:  No Has patient had a PCN reaction occurring within the last 10 years: No If all of the above answers are "NO", then may proceed with Cephalosporin use.   . Valsartan Other (See Comments) and Itching    Heart sped up    Past Medical History, Surgical history, Social history, and Family History were reviewed and updated.  Review of Systems: All other 10 point review of systems is negative.   Physical Exam:  vitals were not taken for this visit.   Wt  Readings from Last 3 Encounters:  12/27/18 119 lb 1.9 oz (54 kg)  11/19/18 124 lb (56.2 kg)  10/13/18 121 lb (54.9 kg)    Ocular: Sclerae unicteric, pupils equal, round and reactive to light Ear-nose-throat: Oropharynx clear, dentition fair Lymphatic: No cervical or supraclavicular adenopathy Lungs no rales or rhonchi, good excursion bilaterally Heart regular rate and rhythm, no murmur appreciated Abd soft, nontender, positive bowel sounds, no liver or spleen tip palpated on exam, no fluid wave  MSK no focal spinal tenderness, no joint edema Neuro: non-focal, well-oriented, appropriate affect Breasts: Deferred   Lab Results  Component Value Date   WBC 5.3 12/27/2018   HGB 9.1 (L) 12/27/2018   HCT 29.0 (L) 12/27/2018   MCV 100.3 (H) 12/27/2018   PLT 216 12/27/2018   Lab Results  Component Value Date   FERRITIN 487 (H) 12/27/2018   IRON 93 12/27/2018   TIBC 378 12/27/2018   UIBC 284 12/27/2018   IRONPCTSAT 25 12/27/2018   Lab Results  Component Value Date   RETICCTPCT 1.4 12/27/2018   RBC 2.89 (L) 12/27/2018   RBC 2.91 (L) 12/27/2018   No results found for: KPAFRELGTCHN, LAMBDASER, KAPLAMBRATIO No results found for: IGGSERUM, IGA, IGMSERUM No results found for: Kathrynn Ducking, MSPIKE, SPEI   Chemistry      Component Value Date/Time   NA 138 12/27/2018 1522   NA 145 02/13/2017 1046   NA 142 12/23/2016 0951   K 4.3 12/27/2018 1522   K 4.6 02/13/2017 1046   K 3.9 12/23/2016 0951   CL 105 12/27/2018 1522   CL 108 02/13/2017 1046   CO2 25 12/27/2018 1522   CO2 28 02/13/2017 1046   CO2 25 12/23/2016 0951   BUN 24 (H) 12/27/2018 1522   BUN 22 02/13/2017 1046   BUN 15.0 12/23/2016 0951   CREATININE 1.30 (H) 12/27/2018 1522   CREATININE 1.1 02/13/2017 1046   CREATININE 0.9 12/23/2016 0951      Component Value Date/Time   CALCIUM 9.7 12/27/2018 1522   CALCIUM 9.7 02/13/2017 1046   CALCIUM 9.5 12/23/2016 0951   ALKPHOS  27 (L) 12/27/2018 1522   ALKPHOS 60 02/13/2017 1046   ALKPHOS 69 12/23/2016 0951   AST 32 12/27/2018 1522   AST 26 12/23/2016 0951   ALT 22 12/27/2018 1522   ALT 18 02/13/2017 1046   ALT 16 12/23/2016 0951   BILITOT 0.7 12/27/2018 1522   BILITOT 0.96 12/23/2016 0951       Impression and Plan: Rachael Davenport is a very pleasant 73 yo African American female with anemia of chronic renal insufficiency and erythropoietin deficiency. She received Retacrit today for Hgb 8.7.  We will see her back in another 3 weeks for lab and injection and follow-up with lab and injection again 6 weeks.   She will contact our office with any questions or concerns. We can certainly see her sooner if needed.   Laverna Peace, NP 11/17/20203:11 PM

## 2019-02-06 LAB — IRON AND TIBC
Iron: 70 ug/dL (ref 41–142)
Saturation Ratios: 19 % — ABNORMAL LOW (ref 21–57)
TIBC: 365 ug/dL (ref 236–444)
UIBC: 295 ug/dL (ref 120–384)

## 2019-02-06 LAB — FERRITIN: Ferritin: 483 ng/mL — ABNORMAL HIGH (ref 11–307)

## 2019-02-07 ENCOUNTER — Other Ambulatory Visit: Payer: Self-pay | Admitting: Family

## 2019-02-13 ENCOUNTER — Other Ambulatory Visit: Payer: Self-pay

## 2019-02-13 ENCOUNTER — Inpatient Hospital Stay: Payer: Medicare Other

## 2019-02-13 VITALS — BP 122/52 | HR 81 | Temp 97.0°F | Resp 17

## 2019-02-13 DIAGNOSIS — D500 Iron deficiency anemia secondary to blood loss (chronic): Secondary | ICD-10-CM

## 2019-02-13 DIAGNOSIS — I129 Hypertensive chronic kidney disease with stage 1 through stage 4 chronic kidney disease, or unspecified chronic kidney disease: Secondary | ICD-10-CM | POA: Diagnosis not present

## 2019-02-13 DIAGNOSIS — D631 Anemia in chronic kidney disease: Secondary | ICD-10-CM

## 2019-02-13 MED ORDER — SODIUM CHLORIDE 0.9 % IV SOLN
Freq: Once | INTRAVENOUS | Status: AC
  Administered 2019-02-13: 13:00:00 via INTRAVENOUS
  Filled 2019-02-13: qty 250

## 2019-02-13 MED ORDER — SODIUM CHLORIDE 0.9 % IV SOLN
200.0000 mg | Freq: Once | INTRAVENOUS | Status: AC
  Administered 2019-02-13: 200 mg via INTRAVENOUS
  Filled 2019-02-13: qty 10

## 2019-02-13 NOTE — Patient Instructions (Signed)

## 2019-02-19 ENCOUNTER — Other Ambulatory Visit: Payer: Self-pay

## 2019-02-19 ENCOUNTER — Inpatient Hospital Stay: Payer: Medicare Other | Attending: Family

## 2019-02-19 VITALS — BP 124/53 | HR 90 | Temp 96.9°F | Resp 17

## 2019-02-19 DIAGNOSIS — D631 Anemia in chronic kidney disease: Secondary | ICD-10-CM

## 2019-02-19 DIAGNOSIS — D500 Iron deficiency anemia secondary to blood loss (chronic): Secondary | ICD-10-CM | POA: Diagnosis not present

## 2019-02-19 DIAGNOSIS — Z79899 Other long term (current) drug therapy: Secondary | ICD-10-CM | POA: Diagnosis not present

## 2019-02-19 MED ORDER — SODIUM CHLORIDE 0.9 % IV SOLN
Freq: Once | INTRAVENOUS | Status: AC
  Administered 2019-02-19: 14:00:00 via INTRAVENOUS
  Filled 2019-02-19: qty 250

## 2019-02-19 MED ORDER — SODIUM CHLORIDE 0.9 % IV SOLN
200.0000 mg | Freq: Once | INTRAVENOUS | Status: AC
  Administered 2019-02-19: 200 mg via INTRAVENOUS
  Filled 2019-02-19: qty 10

## 2019-02-19 NOTE — Patient Instructions (Signed)

## 2019-02-26 ENCOUNTER — Inpatient Hospital Stay: Payer: Medicare Other

## 2019-02-26 ENCOUNTER — Other Ambulatory Visit: Payer: Self-pay

## 2019-02-26 VITALS — BP 117/61 | HR 89 | Temp 96.9°F | Resp 19

## 2019-02-26 DIAGNOSIS — D631 Anemia in chronic kidney disease: Secondary | ICD-10-CM

## 2019-02-26 DIAGNOSIS — D500 Iron deficiency anemia secondary to blood loss (chronic): Secondary | ICD-10-CM | POA: Diagnosis not present

## 2019-02-26 LAB — CMP (CANCER CENTER ONLY)
ALT: 23 U/L (ref 0–44)
AST: 34 U/L (ref 15–41)
Albumin: 4.7 g/dL (ref 3.5–5.0)
Alkaline Phosphatase: 29 U/L — ABNORMAL LOW (ref 38–126)
Anion gap: 9 (ref 5–15)
BUN: 25 mg/dL — ABNORMAL HIGH (ref 8–23)
CO2: 23 mmol/L (ref 22–32)
Calcium: 9.6 mg/dL (ref 8.9–10.3)
Chloride: 103 mmol/L (ref 98–111)
Creatinine: 1.23 mg/dL — ABNORMAL HIGH (ref 0.44–1.00)
GFR, Est AFR Am: 50 mL/min — ABNORMAL LOW (ref >60)
GFR, Estimated: 43 mL/min — ABNORMAL LOW (ref >60)
Glucose, Bld: 128 mg/dL — ABNORMAL HIGH (ref 70–99)
Potassium: 4.4 mmol/L (ref 3.5–5.1)
Sodium: 135 mmol/L (ref 135–145)
Total Bilirubin: 0.7 mg/dL (ref 0.3–1.2)
Total Protein: 8 g/dL (ref 6.5–8.1)

## 2019-02-26 LAB — CBC WITH DIFFERENTIAL (CANCER CENTER ONLY)
Abs Immature Granulocytes: 0.01 10*3/uL (ref 0.00–0.07)
Basophils Absolute: 0.1 10*3/uL (ref 0.0–0.1)
Basophils Relative: 1 %
Eosinophils Absolute: 0.1 10*3/uL (ref 0.0–0.5)
Eosinophils Relative: 2 %
HCT: 33.8 % — ABNORMAL LOW (ref 36.0–46.0)
Hemoglobin: 10.4 g/dL — ABNORMAL LOW (ref 12.0–15.0)
Immature Granulocytes: 0 %
Lymphocytes Relative: 27 %
Lymphs Abs: 1.4 10*3/uL (ref 0.7–4.0)
MCH: 32.1 pg (ref 26.0–34.0)
MCHC: 30.8 g/dL (ref 30.0–36.0)
MCV: 104.3 fL — ABNORMAL HIGH (ref 80.0–100.0)
Monocytes Absolute: 0.5 10*3/uL (ref 0.1–1.0)
Monocytes Relative: 10 %
Neutro Abs: 3 10*3/uL (ref 1.7–7.7)
Neutrophils Relative %: 60 %
Platelet Count: 237 10*3/uL (ref 150–400)
RBC: 3.24 MIL/uL — ABNORMAL LOW (ref 3.87–5.11)
RDW: 13.8 % (ref 11.5–15.5)
WBC Count: 5.1 10*3/uL (ref 4.0–10.5)
nRBC: 0 % (ref 0.0–0.2)

## 2019-02-26 MED ORDER — EPOETIN ALFA-EPBX 40000 UNIT/ML IJ SOLN
INTRAMUSCULAR | Status: AC
  Filled 2019-02-26: qty 1

## 2019-02-26 MED ORDER — EPOETIN ALFA-EPBX 40000 UNIT/ML IJ SOLN
40000.0000 [IU] | Freq: Once | INTRAMUSCULAR | Status: AC
  Administered 2019-02-26: 40000 [IU] via SUBCUTANEOUS

## 2019-02-26 NOTE — Patient Instructions (Signed)

## 2019-03-19 ENCOUNTER — Inpatient Hospital Stay: Payer: Medicare Other

## 2019-03-19 ENCOUNTER — Encounter: Payer: Self-pay | Admitting: Family

## 2019-03-19 ENCOUNTER — Other Ambulatory Visit: Payer: Self-pay

## 2019-03-19 ENCOUNTER — Inpatient Hospital Stay (HOSPITAL_BASED_OUTPATIENT_CLINIC_OR_DEPARTMENT_OTHER): Payer: Medicare Other | Admitting: Family

## 2019-03-19 VITALS — BP 117/65 | HR 84 | Temp 97.3°F | Resp 18 | Ht 62.0 in | Wt 115.1 lb

## 2019-03-19 DIAGNOSIS — D631 Anemia in chronic kidney disease: Secondary | ICD-10-CM

## 2019-03-19 DIAGNOSIS — D500 Iron deficiency anemia secondary to blood loss (chronic): Secondary | ICD-10-CM | POA: Diagnosis not present

## 2019-03-19 LAB — CBC WITH DIFFERENTIAL (CANCER CENTER ONLY)
Abs Immature Granulocytes: 0.01 10*3/uL (ref 0.00–0.07)
Basophils Absolute: 0 10*3/uL (ref 0.0–0.1)
Basophils Relative: 1 %
Eosinophils Absolute: 0.1 10*3/uL (ref 0.0–0.5)
Eosinophils Relative: 1 %
HCT: 36 % (ref 36.0–46.0)
Hemoglobin: 11.1 g/dL — ABNORMAL LOW (ref 12.0–15.0)
Immature Granulocytes: 0 %
Lymphocytes Relative: 12 %
Lymphs Abs: 0.7 10*3/uL (ref 0.7–4.0)
MCH: 31.9 pg (ref 26.0–34.0)
MCHC: 30.8 g/dL (ref 30.0–36.0)
MCV: 103.4 fL — ABNORMAL HIGH (ref 80.0–100.0)
Monocytes Absolute: 0.4 10*3/uL (ref 0.1–1.0)
Monocytes Relative: 6 %
Neutro Abs: 4.5 10*3/uL (ref 1.7–7.7)
Neutrophils Relative %: 80 %
Platelet Count: 180 10*3/uL (ref 150–400)
RBC: 3.48 MIL/uL — ABNORMAL LOW (ref 3.87–5.11)
RDW: 13.9 % (ref 11.5–15.5)
WBC Count: 5.6 10*3/uL (ref 4.0–10.5)
nRBC: 0 % (ref 0.0–0.2)

## 2019-03-19 LAB — CMP (CANCER CENTER ONLY)
ALT: 19 U/L (ref 0–44)
AST: 28 U/L (ref 15–41)
Albumin: 4.5 g/dL (ref 3.5–5.0)
Alkaline Phosphatase: 30 U/L — ABNORMAL LOW (ref 38–126)
Anion gap: 7 (ref 5–15)
BUN: 33 mg/dL — ABNORMAL HIGH (ref 8–23)
CO2: 27 mmol/L (ref 22–32)
Calcium: 10 mg/dL (ref 8.9–10.3)
Chloride: 105 mmol/L (ref 98–111)
Creatinine: 1.35 mg/dL — ABNORMAL HIGH (ref 0.44–1.00)
GFR, Est AFR Am: 45 mL/min — ABNORMAL LOW (ref >60)
GFR, Estimated: 39 mL/min — ABNORMAL LOW (ref >60)
Glucose, Bld: 102 mg/dL — ABNORMAL HIGH (ref 70–99)
Potassium: 4.5 mmol/L (ref 3.5–5.1)
Sodium: 139 mmol/L (ref 135–145)
Total Bilirubin: 0.7 mg/dL (ref 0.3–1.2)
Total Protein: 7.5 g/dL (ref 6.5–8.1)

## 2019-03-19 LAB — RETICULOCYTES
Immature Retic Fract: 0.7 % — ABNORMAL LOW (ref 2.3–15.9)
RBC.: 3.48 MIL/uL — ABNORMAL LOW (ref 3.87–5.11)
Retic Count, Absolute: 22.3 10*3/uL (ref 19.0–186.0)
Retic Ct Pct: 0.6 % (ref 0.4–3.1)

## 2019-03-19 NOTE — Progress Notes (Signed)
Hematology and Oncology Follow Up Visit  Rachael Davenport [id number] Mar 25, 1945 73 y.o. 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 She is doing well and has no complaints at this time.  She has respond nicely to Retacrit and IV iron. Hgb is now up to 11.1, MCV 103.  She has not had any issue with blood loss. No bruising or petechiae.  No fever, chills, n/v, cough, rash, dizziness, SOB, chest pain, palpitations, abdominal pain or changes in bowel or bladder habits.  No swelling, tenderness, numbness or tingling in her extremities at this time. She will sometimes notice tingling and numbness in her right hand.  No falls or syncopal episodes to report.  She has lost 4 lbs since her last visit and plans to request a referral to a dietician. She states that she is making an effort to eat 3 meals a day as well as adding in Boost. She plans to switch to the high protein Boost. She is hydrating well.   ECOG Performance Status: 1 - Symptomatic but completely ambulatory  Medications:  Allergies as of 03/19/2019      Reactions   Penicillins Itching, Swelling   Has patient had a PCN reaction causing immediate rash, facial/tongue/throat swelling, SOB or lightheadedness with hypotension: Yes Has patient had a PCN reaction causing severe rash involving mucus membranes or skin necrosis: No Has patient had a PCN reaction that required hospitalization: No Has patient had a PCN reaction occurring within the last 10 years: No If all of the above answers are "NO", then may proceed with Cephalosporin use.   Valsartan Other (See Comments), Itching   Heart sped up      Medication List       Accurate as of March 19, 2019  3:12 PM. If you have any questions, ask your nurse or doctor.        aspirin EC 81 MG  tablet Take 81 mg by mouth daily.   Fenofibric Acid 135 MG Cpdr fenofibric acid (choline) 135 mg capsule,delayed release   fluticasone 50 MCG/ACT nasal spray Commonly known as: FLONASE U 1 SPRAY IEN BID   Imvexxy Maintenance Pack 4 MCG Inst Generic drug: Estradiol Imvexxy Maintenance Pack 4 mcg vaginal insert  Insert 1 vaginal insert twice a week by vaginal route.   olmesartan 20 MG tablet Commonly known as: BENICAR Take 20 mg by mouth daily.   Restasis 0.05 % ophthalmic emulsion Generic drug: cycloSPORINE INT 1 GTT IN OU BID UTD   SUMAtriptan 25 MG tablet Commonly known as: IMITREX Take 25 mg by mouth once as needed for migraine (MAY REPEAT ONCE IN 2 HOURS IF NO RELIEF (MAX 2 TABLETS/24 HOURS)).   Vitamin D (Ergocalciferol) 1.25 MG (50000 UT) Caps capsule Commonly known as: DRISDOL Take 50,000 Units by mouth every 7 (seven) days.   ergocalciferol 1.25 MG (50000 UT) capsule Commonly known as: VITAMIN D2 Take 50,000 Units by mouth once a week.       Allergies:  Allergies  Allergen Reactions  . Penicillins Itching and Swelling    Has patient had a PCN reaction causing immediate rash, facial/tongue/throat swelling, SOB or lightheadedness with hypotension: Yes Has patient had a PCN reaction causing severe rash involving mucus membranes or skin necrosis: No Has patient had a PCN reaction that required hospitalization: No Has patient had  a PCN reaction occurring within the last 10 years: No If all of the above answers are "NO", then may proceed with Cephalosporin use.   . Valsartan Other (See Comments) and Itching    Heart sped up    Past Medical History, Surgical history, Social history, and Family History were reviewed and updated.  Review of Systems: All other 10 point review of systems is negative.   Physical Exam:  vitals were not taken for this visit.   Wt Readings from Last 3 Encounters:  02/05/19 119 lb (54 kg)  12/27/18 119 lb 1.9 oz (54 kg)  11/19/18  124 lb (56.2 kg)    Ocular: Sclerae unicteric, pupils equal, round and reactive to light Ear-nose-throat: Oropharynx clear, dentition fair Lymphatic: No cervical or supraclavicular adenopathy Lungs no rales or rhonchi, good excursion bilaterally Heart regular rate and rhythm, no murmur appreciated Abd soft, nontender, positive bowel sounds, no liver or spleen tip palpated on exam, no fluid wave  MSK no focal spinal tenderness, no joint edema Neuro: non-focal, well-oriented, appropriate affect Breasts: Deferred   Lab Results  Component Value Date   WBC 5.6 03/19/2019   HGB 11.1 (L) 03/19/2019   HCT 36.0 03/19/2019   MCV 103.4 (H) 03/19/2019   PLT 180 03/19/2019   Lab Results  Component Value Date   FERRITIN 483 (H) 02/05/2019   IRON 70 02/05/2019   TIBC 365 02/05/2019   UIBC 295 02/05/2019   IRONPCTSAT 19 (L) 02/05/2019   Lab Results  Component Value Date   RETICCTPCT 0.6 03/19/2019   RBC 3.48 (L) 03/19/2019   No results found for: KPAFRELGTCHN, LAMBDASER, KAPLAMBRATIO No results found for: IGGSERUM, IGA, IGMSERUM No results found for: Kathrynn Ducking, MSPIKE, SPEI   Chemistry      Component Value Date/Time   NA 139 03/19/2019 1436   NA 145 02/13/2017 1046   NA 142 12/23/2016 0951   K 4.5 03/19/2019 1436   K 4.6 02/13/2017 1046   K 3.9 12/23/2016 0951   CL 105 03/19/2019 1436   CL 108 02/13/2017 1046   CO2 27 03/19/2019 1436   CO2 28 02/13/2017 1046   CO2 25 12/23/2016 0951   BUN 33 (H) 03/19/2019 1436   BUN 22 02/13/2017 1046   BUN 15.0 12/23/2016 0951   CREATININE 1.35 (H) 03/19/2019 1436   CREATININE 1.1 02/13/2017 1046   CREATININE 0.9 12/23/2016 0951      Component Value Date/Time   CALCIUM 10.0 03/19/2019 1436   CALCIUM 9.7 02/13/2017 1046   CALCIUM 9.5 12/23/2016 0951   ALKPHOS 30 (L) 03/19/2019 1436   ALKPHOS 60 02/13/2017 1046   ALKPHOS 69 12/23/2016 0951   AST 28 03/19/2019 1436   AST 26 12/23/2016  0951   ALT 19 03/19/2019 1436   ALT 18 02/13/2017 1046   ALT 16 12/23/2016 0951   BILITOT 0.7 03/19/2019 1436   BILITOT 0.96 12/23/2016 0951       Impression and Plan: Ms. Phelan is a very pleasant 73 yo African American female withanemia of chronic renal insufficiency and erythropoietin deficiency. No Aranesp needed this visit.  We will see what her iron studies show and bring her back in for infusion if needed.  We will plan to see her back in another month.  She will contact our office with any questions or concerns. We can certainly see her sooner if needed.   Laverna Peace, NP 12/29/20203:12 PM

## 2019-03-20 LAB — IRON AND TIBC
Iron: 100 ug/dL (ref 41–142)
Saturation Ratios: 26 % (ref 21–57)
TIBC: 378 ug/dL (ref 236–444)
UIBC: 278 ug/dL (ref 120–384)

## 2019-03-20 LAB — FERRITIN: Ferritin: 696 ng/mL — ABNORMAL HIGH (ref 11–307)

## 2019-04-16 ENCOUNTER — Inpatient Hospital Stay: Payer: Medicare Other

## 2019-04-16 ENCOUNTER — Encounter: Payer: Self-pay | Admitting: Family

## 2019-04-16 ENCOUNTER — Inpatient Hospital Stay: Payer: Medicare Other | Attending: Family | Admitting: Family

## 2019-04-16 ENCOUNTER — Other Ambulatory Visit: Payer: Self-pay

## 2019-04-16 VITALS — BP 122/57 | HR 82 | Temp 97.1°F | Resp 18 | Ht 62.0 in | Wt 114.8 lb

## 2019-04-16 DIAGNOSIS — D631 Anemia in chronic kidney disease: Secondary | ICD-10-CM

## 2019-04-16 DIAGNOSIS — R634 Abnormal weight loss: Secondary | ICD-10-CM | POA: Insufficient documentation

## 2019-04-16 DIAGNOSIS — R202 Paresthesia of skin: Secondary | ICD-10-CM | POA: Insufficient documentation

## 2019-04-16 DIAGNOSIS — D500 Iron deficiency anemia secondary to blood loss (chronic): Secondary | ICD-10-CM

## 2019-04-16 DIAGNOSIS — D509 Iron deficiency anemia, unspecified: Secondary | ICD-10-CM | POA: Diagnosis not present

## 2019-04-16 DIAGNOSIS — Z79899 Other long term (current) drug therapy: Secondary | ICD-10-CM | POA: Insufficient documentation

## 2019-04-16 DIAGNOSIS — N183 Chronic kidney disease, stage 3 unspecified: Secondary | ICD-10-CM | POA: Diagnosis not present

## 2019-04-16 DIAGNOSIS — R200 Anesthesia of skin: Secondary | ICD-10-CM | POA: Diagnosis not present

## 2019-04-16 DIAGNOSIS — N189 Chronic kidney disease, unspecified: Secondary | ICD-10-CM

## 2019-04-16 DIAGNOSIS — I129 Hypertensive chronic kidney disease with stage 1 through stage 4 chronic kidney disease, or unspecified chronic kidney disease: Secondary | ICD-10-CM | POA: Diagnosis present

## 2019-04-16 LAB — CMP (CANCER CENTER ONLY)
ALT: 20 U/L (ref 0–44)
AST: 26 U/L (ref 15–41)
Albumin: 4.5 g/dL (ref 3.5–5.0)
Alkaline Phosphatase: 34 U/L — ABNORMAL LOW (ref 38–126)
Anion gap: 6 (ref 5–15)
BUN: 50 mg/dL — ABNORMAL HIGH (ref 8–23)
CO2: 28 mmol/L (ref 22–32)
Calcium: 10.1 mg/dL (ref 8.9–10.3)
Chloride: 107 mmol/L (ref 98–111)
Creatinine: 1.55 mg/dL — ABNORMAL HIGH (ref 0.44–1.00)
GFR, Est AFR Am: 38 mL/min — ABNORMAL LOW (ref >60)
GFR, Estimated: 33 mL/min — ABNORMAL LOW (ref >60)
Glucose, Bld: 127 mg/dL — ABNORMAL HIGH (ref 70–99)
Potassium: 4.9 mmol/L (ref 3.5–5.1)
Sodium: 141 mmol/L (ref 135–145)
Total Bilirubin: 0.5 mg/dL (ref 0.3–1.2)
Total Protein: 7.5 g/dL (ref 6.5–8.1)

## 2019-04-16 LAB — CBC WITH DIFFERENTIAL (CANCER CENTER ONLY)
Abs Immature Granulocytes: 0.03 10*3/uL (ref 0.00–0.07)
Basophils Absolute: 0.1 10*3/uL (ref 0.0–0.1)
Basophils Relative: 1 %
Eosinophils Absolute: 0.2 10*3/uL (ref 0.0–0.5)
Eosinophils Relative: 4 %
HCT: 31.6 % — ABNORMAL LOW (ref 36.0–46.0)
Hemoglobin: 9.6 g/dL — ABNORMAL LOW (ref 12.0–15.0)
Immature Granulocytes: 1 %
Lymphocytes Relative: 28 %
Lymphs Abs: 1.5 10*3/uL (ref 0.7–4.0)
MCH: 31.3 pg (ref 26.0–34.0)
MCHC: 30.4 g/dL (ref 30.0–36.0)
MCV: 102.9 fL — ABNORMAL HIGH (ref 80.0–100.0)
Monocytes Absolute: 0.5 10*3/uL (ref 0.1–1.0)
Monocytes Relative: 9 %
Neutro Abs: 3 10*3/uL (ref 1.7–7.7)
Neutrophils Relative %: 57 %
Platelet Count: 207 10*3/uL (ref 150–400)
RBC: 3.07 MIL/uL — ABNORMAL LOW (ref 3.87–5.11)
RDW: 13.3 % (ref 11.5–15.5)
WBC Count: 5.2 10*3/uL (ref 4.0–10.5)
nRBC: 0 % (ref 0.0–0.2)

## 2019-04-16 LAB — RETICULOCYTES
Immature Retic Fract: 4.5 % (ref 2.3–15.9)
RBC.: 3.02 MIL/uL — ABNORMAL LOW (ref 3.87–5.11)
Retic Count, Absolute: 32 10*3/uL (ref 19.0–186.0)
Retic Ct Pct: 1.1 % (ref 0.4–3.1)

## 2019-04-16 MED ORDER — EPOETIN ALFA-EPBX 40000 UNIT/ML IJ SOLN
INTRAMUSCULAR | Status: AC
  Filled 2019-04-16: qty 1

## 2019-04-16 MED ORDER — EPOETIN ALFA-EPBX 40000 UNIT/ML IJ SOLN
40000.0000 [IU] | Freq: Once | INTRAMUSCULAR | Status: AC
  Administered 2019-04-16: 40000 [IU] via SUBCUTANEOUS

## 2019-04-16 NOTE — Progress Notes (Signed)
Hematology and Oncology Follow Up Visit  Rachael Davenport [id number] 1946-02-23 74 y.o. 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 She is doing well and was able to see PCP about her weight loss. She is now doing Boost as well as milkshakes daily.  She has occasional fatigue at times.  No episodes of bleeding. No bruising or petechiae.  No fever, chills, n/v, cough, rash, dizziness, SOB, chest pain, palpitations, abdominal pain or changes in bowel or bladder habits.  No swelling or tenderness in her extremities at this time. She sometimes has puffiness in her ankles at the end of the day that resolves over night.  She has numbness and tingling in her right hand all the time.  No falls or syncopal episodes.  She has maintained a good appetite and is staying well hydrated. Her weight is stable at 114.8 lbs.   ECOG Performance Status: 1 - Symptomatic but completely ambulatory  Medications:  Allergies as of 04/16/2019      Reactions   Penicillins Itching, Swelling   Has patient had a PCN reaction causing immediate rash, facial/tongue/throat swelling, SOB or lightheadedness with hypotension: Yes Has patient had a PCN reaction causing severe rash involving mucus membranes or skin necrosis: No Has patient had a PCN reaction that required hospitalization: No Has patient had a PCN reaction occurring within the last 10 years: No If all of the above answers are "NO", then may proceed with Cephalosporin use.   Valsartan Other (See Comments), Itching   Heart sped up      Medication List       Accurate as of April 16, 2019  3:56 PM. If you have any questions, ask your nurse or doctor.        aspirin EC 81 MG tablet Take 81 mg by mouth daily.   Fenofibric Acid 135 MG  Cpdr fenofibric acid (choline) 135 mg capsule,delayed release   fluticasone 50 MCG/ACT nasal spray Commonly known as: FLONASE U 1 SPRAY IEN BID   Imvexxy Maintenance Pack 4 MCG Inst Generic drug: Estradiol Imvexxy Maintenance Pack 4 mcg vaginal insert  Insert 1 vaginal insert twice a week by vaginal route.   olmesartan 20 MG tablet Commonly known as: BENICAR Take 20 mg by mouth daily.   Restasis 0.05 % ophthalmic emulsion Generic drug: cycloSPORINE INT 1 GTT IN OU BID UTD   SUMAtriptan 25 MG tablet Commonly known as: IMITREX Take 25 mg by mouth once as needed for migraine (MAY REPEAT ONCE IN 2 HOURS IF NO RELIEF (MAX 2 TABLETS/24 HOURS)).   Vitamin D (Ergocalciferol) 1.25 MG (50000 UNIT) Caps capsule Commonly known as: DRISDOL Take 50,000 Units by mouth every 7 (seven) days.   ergocalciferol 1.25 MG (50000 UT) capsule Commonly known as: VITAMIN D2 Take 50,000 Units by mouth once a week.       Allergies:  Allergies  Allergen Reactions  . Penicillins Itching and Swelling    Has patient had a PCN reaction causing immediate rash, facial/tongue/throat swelling, SOB or lightheadedness with hypotension: Yes Has patient had a PCN reaction causing severe rash involving mucus membranes or skin necrosis: No Has patient had a PCN reaction that required hospitalization: No Has patient had a PCN reaction occurring within the last 10 years: No If all of the above answers are "NO",  then may proceed with Cephalosporin use.   . Valsartan Other (See Comments) and Itching    Heart sped up    Past Medical History, Surgical history, Social history, and Family History were reviewed and updated.  Review of Systems: All other 10 point review of systems is negative.   Physical Exam:  height is 5\' 2"  (1.575 m) and weight is 114 lb 12.8 oz (52.1 kg). Her temporal temperature is 97.1 F (36.2 C) (abnormal). Her blood pressure is 122/57 (abnormal) and her pulse is 82. Her respiration is 18  and oxygen saturation is 100%.   Wt Readings from Last 3 Encounters:  04/16/19 114 lb 12.8 oz (52.1 kg)  03/19/19 115 lb 1.9 oz (52.2 kg)  02/05/19 119 lb (54 kg)    Ocular: Sclerae unicteric, pupils equal, round and reactive to light Ear-nose-throat: Oropharynx clear, dentition fair Lymphatic: No cervical or supraclavicular adenopathy Lungs no rales or rhonchi, good excursion bilaterally Heart regular rate and rhythm, no murmur appreciated Abd soft, nontender, positive bowel sounds, no liver or spleen tip palpated on exam, no fluid wave MSK no focal spinal tenderness, no joint edema Neuro: non-focal, well-oriented, appropriate affect Breasts: Deferred   Lab Results  Component Value Date   WBC 5.2 04/16/2019   HGB 9.6 (L) 04/16/2019   HCT 31.6 (L) 04/16/2019   MCV 102.9 (H) 04/16/2019   PLT 207 04/16/2019   Lab Results  Component Value Date   FERRITIN 696 (H) 03/19/2019   IRON 100 03/19/2019   TIBC 378 03/19/2019   UIBC 278 03/19/2019   IRONPCTSAT 26 03/19/2019   Lab Results  Component Value Date   RETICCTPCT 1.1 04/16/2019   RBC 3.07 (L) 04/16/2019   RBC 3.02 (L) 04/16/2019   No results found for: KPAFRELGTCHN, LAMBDASER, KAPLAMBRATIO No results found for: IGGSERUM, IGA, IGMSERUM No results found for: Kathrynn Ducking, MSPIKE, SPEI   Chemistry      Component Value Date/Time   NA 141 04/16/2019 1444   NA 145 02/13/2017 1046   NA 142 12/23/2016 0951   K 4.9 04/16/2019 1444   K 4.6 02/13/2017 1046   K 3.9 12/23/2016 0951   CL 107 04/16/2019 1444   CL 108 02/13/2017 1046   CO2 28 04/16/2019 1444   CO2 28 02/13/2017 1046   CO2 25 12/23/2016 0951   BUN 50 (H) 04/16/2019 1444   BUN 22 02/13/2017 1046   BUN 15.0 12/23/2016 0951   CREATININE 1.55 (H) 04/16/2019 1444   CREATININE 1.1 02/13/2017 1046   CREATININE 0.9 12/23/2016 0951      Component Value Date/Time   CALCIUM 10.1 04/16/2019 1444   CALCIUM 9.7 02/13/2017  1046   CALCIUM 9.5 12/23/2016 0951   ALKPHOS 34 (L) 04/16/2019 1444   ALKPHOS 60 02/13/2017 1046   ALKPHOS 69 12/23/2016 0951   AST 26 04/16/2019 1444   AST 26 12/23/2016 0951   ALT 20 04/16/2019 1444   ALT 18 02/13/2017 1046   ALT 16 12/23/2016 0951   BILITOT 0.5 04/16/2019 1444   BILITOT 0.96 12/23/2016 0951       Impression and Plan: Rachael Davenport is a very pleasant 74 yo African American female withanemia of chronic renal insufficiency and erythropoietin deficiency. She received Retacrit today for Hgb 9.6.  We will see what her iron studies look like and replace if needed.  We will plan to see her back in another 4 weeks.  She will contact our office with any questions  or concerns. We can certainly see her sooner if needed.   Laverna Peace, NP 1/26/20213:56 PM

## 2019-04-16 NOTE — Patient Instructions (Signed)

## 2019-04-17 ENCOUNTER — Telehealth: Payer: Self-pay | Admitting: Family

## 2019-04-17 LAB — IRON AND TIBC
Iron: 119 ug/dL (ref 41–142)
Saturation Ratios: 31 % (ref 21–57)
TIBC: 385 ug/dL (ref 236–444)
UIBC: 267 ug/dL (ref 120–384)

## 2019-04-17 LAB — FERRITIN: Ferritin: 739 ng/mL — ABNORMAL HIGH (ref 11–307)

## 2019-04-17 NOTE — Telephone Encounter (Signed)
Called LMVM for patient regarding appointments added per 1/26 los

## 2019-05-13 ENCOUNTER — Inpatient Hospital Stay: Payer: Medicare Other

## 2019-05-13 ENCOUNTER — Other Ambulatory Visit: Payer: Self-pay

## 2019-05-13 ENCOUNTER — Inpatient Hospital Stay: Payer: Medicare Other | Attending: Family

## 2019-05-13 ENCOUNTER — Encounter: Payer: Self-pay | Admitting: Family

## 2019-05-13 ENCOUNTER — Inpatient Hospital Stay (HOSPITAL_BASED_OUTPATIENT_CLINIC_OR_DEPARTMENT_OTHER): Payer: Medicare Other | Admitting: Family

## 2019-05-13 VITALS — BP 119/55 | HR 75 | Temp 97.3°F | Resp 18 | Ht 62.0 in | Wt 115.8 lb

## 2019-05-13 DIAGNOSIS — D508 Other iron deficiency anemias: Secondary | ICD-10-CM

## 2019-05-13 DIAGNOSIS — I129 Hypertensive chronic kidney disease with stage 1 through stage 4 chronic kidney disease, or unspecified chronic kidney disease: Secondary | ICD-10-CM | POA: Insufficient documentation

## 2019-05-13 DIAGNOSIS — R200 Anesthesia of skin: Secondary | ICD-10-CM | POA: Insufficient documentation

## 2019-05-13 DIAGNOSIS — N189 Chronic kidney disease, unspecified: Secondary | ICD-10-CM

## 2019-05-13 DIAGNOSIS — D500 Iron deficiency anemia secondary to blood loss (chronic): Secondary | ICD-10-CM

## 2019-05-13 DIAGNOSIS — D631 Anemia in chronic kidney disease: Secondary | ICD-10-CM | POA: Diagnosis not present

## 2019-05-13 DIAGNOSIS — Z79899 Other long term (current) drug therapy: Secondary | ICD-10-CM | POA: Diagnosis not present

## 2019-05-13 DIAGNOSIS — R202 Paresthesia of skin: Secondary | ICD-10-CM | POA: Diagnosis not present

## 2019-05-13 LAB — CBC WITH DIFFERENTIAL (CANCER CENTER ONLY)
Abs Immature Granulocytes: 0.04 10*3/uL (ref 0.00–0.07)
Basophils Absolute: 0.1 10*3/uL (ref 0.0–0.1)
Basophils Relative: 1 %
Eosinophils Absolute: 0.1 10*3/uL (ref 0.0–0.5)
Eosinophils Relative: 3 %
HCT: 31.9 % — ABNORMAL LOW (ref 36.0–46.0)
Hemoglobin: 9.8 g/dL — ABNORMAL LOW (ref 12.0–15.0)
Immature Granulocytes: 1 %
Lymphocytes Relative: 32 %
Lymphs Abs: 1.4 10*3/uL (ref 0.7–4.0)
MCH: 31.9 pg (ref 26.0–34.0)
MCHC: 30.7 g/dL (ref 30.0–36.0)
MCV: 103.9 fL — ABNORMAL HIGH (ref 80.0–100.0)
Monocytes Absolute: 0.5 10*3/uL (ref 0.1–1.0)
Monocytes Relative: 12 %
Neutro Abs: 2.3 10*3/uL (ref 1.7–7.7)
Neutrophils Relative %: 51 %
Platelet Count: 195 10*3/uL (ref 150–400)
RBC: 3.07 MIL/uL — ABNORMAL LOW (ref 3.87–5.11)
RDW: 13.7 % (ref 11.5–15.5)
WBC Count: 4.5 10*3/uL (ref 4.0–10.5)
nRBC: 0 % (ref 0.0–0.2)

## 2019-05-13 LAB — CMP (CANCER CENTER ONLY)
ALT: 19 U/L (ref 0–44)
AST: 29 U/L (ref 15–41)
Albumin: 4.4 g/dL (ref 3.5–5.0)
Alkaline Phosphatase: 30 U/L — ABNORMAL LOW (ref 38–126)
Anion gap: 6 (ref 5–15)
BUN: 40 mg/dL — ABNORMAL HIGH (ref 8–23)
CO2: 27 mmol/L (ref 22–32)
Calcium: 10.1 mg/dL (ref 8.9–10.3)
Chloride: 107 mmol/L (ref 98–111)
Creatinine: 1.4 mg/dL — ABNORMAL HIGH (ref 0.44–1.00)
GFR, Est AFR Am: 43 mL/min — ABNORMAL LOW (ref >60)
GFR, Estimated: 37 mL/min — ABNORMAL LOW (ref >60)
Glucose, Bld: 92 mg/dL (ref 70–99)
Potassium: 5.2 mmol/L — ABNORMAL HIGH (ref 3.5–5.1)
Sodium: 140 mmol/L (ref 135–145)
Total Bilirubin: 0.5 mg/dL (ref 0.3–1.2)
Total Protein: 7.6 g/dL (ref 6.5–8.1)

## 2019-05-13 LAB — RETICULOCYTES
Immature Retic Fract: 4.7 % (ref 2.3–15.9)
RBC.: 3.06 MIL/uL — ABNORMAL LOW (ref 3.87–5.11)
Retic Count, Absolute: 24.8 10*3/uL (ref 19.0–186.0)
Retic Ct Pct: 0.8 % (ref 0.4–3.1)

## 2019-05-13 MED ORDER — EPOETIN ALFA-EPBX 40000 UNIT/ML IJ SOLN
40000.0000 [IU] | Freq: Once | INTRAMUSCULAR | Status: AC
  Administered 2019-05-13: 40000 [IU] via SUBCUTANEOUS

## 2019-05-13 MED ORDER — EPOETIN ALFA-EPBX 40000 UNIT/ML IJ SOLN
INTRAMUSCULAR | Status: AC
  Filled 2019-05-13: qty 1

## 2019-05-13 NOTE — Patient Instructions (Signed)

## 2019-05-13 NOTE — Progress Notes (Signed)
Hematology and Oncology Follow Up Visit  Rachael Davenport [id number] 1946/01/26 74 y.o. 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 is here today for follow-up and Retacrit injection. She is doing well and has no complaints at this time.  Hgb is stable at 9.8, MCV 103.  No episodes of bleeding to report. No bruising or petechiae.  No fever, chills, n/v, cough, rash, dizziness, SOB, chest pain, palpitations, abdominal pain or changes in bowel or bladder habits.  No swelling or tenderness in her extremities. The numbness and tingling in her right hand is stable.  No falls or syncopal episodes to report.  She has maintained a good appetite and is staying well hydrated. Her weight is stable at 115.8 lbs.   ECOG Performance Status: 1 - Symptomatic but completely ambulatory  Medications:  Allergies as of 05/13/2019      Reactions   Penicillins Itching, Swelling   Has patient had a PCN reaction causing immediate rash, facial/tongue/throat swelling, SOB or lightheadedness with hypotension: Yes Has patient had a PCN reaction causing severe rash involving mucus membranes or skin necrosis: No Has patient had a PCN reaction that required hospitalization: No Has patient had a PCN reaction occurring within the last 10 years: No If all of the above answers are "NO", then may proceed with Cephalosporin use.   Valsartan Other (See Comments), Itching   Heart sped up      Medication List       Accurate as of May 13, 2019  3:03 PM. If you have any questions, ask your nurse or doctor.        aspirin EC 81 MG tablet Take 81 mg by mouth daily.   Fenofibric Acid 135 MG Cpdr fenofibric acid (choline) 135 mg capsule,delayed release   fluticasone 50 MCG/ACT nasal spray Commonly known as: FLONASE U 1 SPRAY IEN BID   Imvexxy  Maintenance Pack 4 MCG Inst Generic drug: Estradiol Imvexxy Maintenance Pack 4 mcg vaginal insert  Insert 1 vaginal insert twice a week by vaginal route.   olmesartan 20 MG tablet Commonly known as: BENICAR Take 20 mg by mouth daily.   Restasis 0.05 % ophthalmic emulsion Generic drug: cycloSPORINE INT 1 GTT IN OU BID UTD   SUMAtriptan 25 MG tablet Commonly known as: IMITREX Take 25 mg by mouth once as needed for migraine (MAY REPEAT ONCE IN 2 HOURS IF NO RELIEF (MAX 2 TABLETS/24 HOURS)).   Vitamin D (Ergocalciferol) 1.25 MG (50000 UNIT) Caps capsule Commonly known as: DRISDOL Take 50,000 Units by mouth every 7 (seven) days.   ergocalciferol 1.25 MG (50000 UT) capsule Commonly known as: VITAMIN D2 Take 50,000 Units by mouth once a week.       Allergies:  Allergies  Allergen Reactions  . Penicillins Itching and Swelling    Has patient had a PCN reaction causing immediate rash, facial/tongue/throat swelling, SOB or lightheadedness with hypotension: Yes Has patient had a PCN reaction causing severe rash involving mucus membranes or skin necrosis: No Has patient had a PCN reaction that required hospitalization: No Has patient had a PCN reaction occurring within the last 10 years: No If all of the above answers are "NO", then may proceed with Cephalosporin use.   . Valsartan Other (See Comments) and Itching    Heart sped up    Past Medical History, Surgical history, Social  history, and Family History were reviewed and updated.  Review of Systems: All other 10 point review of systems is negative.   Physical Exam:  vitals were not taken for this visit.   Wt Readings from Last 3 Encounters:  04/16/19 114 lb 12.8 oz (52.1 kg)  03/19/19 115 lb 1.9 oz (52.2 kg)  02/05/19 119 lb (54 kg)    Ocular: Sclerae unicteric, pupils equal, round and reactive to light Ear-nose-throat: Oropharynx clear, dentition fair Lymphatic: No cervical or supraclavicular adenopathy Lungs no  rales or rhonchi, good excursion bilaterally Heart regular rate and rhythm, no murmur appreciated Abd soft, nontender, positive bowel sounds, no liver or spleen tip palpated on exam, no fluid wave  MSK no focal spinal tenderness, no joint edema Neuro: non-focal, well-oriented, appropriate affect Breasts: Deferred   Lab Results  Component Value Date   WBC 4.5 05/13/2019   HGB 9.8 (L) 05/13/2019   HCT 31.9 (L) 05/13/2019   MCV 103.9 (H) 05/13/2019   PLT 195 05/13/2019   Lab Results  Component Value Date   FERRITIN 739 (H) 04/16/2019   IRON 119 04/16/2019   TIBC 385 04/16/2019   UIBC 267 04/16/2019   IRONPCTSAT 31 04/16/2019   Lab Results  Component Value Date   RETICCTPCT 0.8 05/13/2019   RBC 3.06 (L) 05/13/2019   No results found for: KPAFRELGTCHN, LAMBDASER, KAPLAMBRATIO No results found for: IGGSERUM, IGA, IGMSERUM No results found for: Kathrynn Ducking, MSPIKE, SPEI   Chemistry      Component Value Date/Time   NA 140 05/13/2019 1416   NA 145 02/13/2017 1046   NA 142 12/23/2016 0951   K 5.2 (H) 05/13/2019 1416   K 4.6 02/13/2017 1046   K 3.9 12/23/2016 0951   CL 107 05/13/2019 1416   CL 108 02/13/2017 1046   CO2 27 05/13/2019 1416   CO2 28 02/13/2017 1046   CO2 25 12/23/2016 0951   BUN 40 (H) 05/13/2019 1416   BUN 22 02/13/2017 1046   BUN 15.0 12/23/2016 0951   CREATININE 1.40 (H) 05/13/2019 1416   CREATININE 1.1 02/13/2017 1046   CREATININE 0.9 12/23/2016 0951      Component Value Date/Time   CALCIUM 10.1 05/13/2019 1416   CALCIUM 9.7 02/13/2017 1046   CALCIUM 9.5 12/23/2016 0951   ALKPHOS 30 (L) 05/13/2019 1416   ALKPHOS 60 02/13/2017 1046   ALKPHOS 69 12/23/2016 0951   AST 29 05/13/2019 1416   AST 26 12/23/2016 0951   ALT 19 05/13/2019 1416   ALT 18 02/13/2017 1046   ALT 16 12/23/2016 0951   BILITOT 0.5 05/13/2019 1416   BILITOT 0.96 12/23/2016 0951       Impression and Plan: [name] is a very  pleasant 74 yo African American female withanemia of chronic renal insufficiency and erythropoietin deficiency. She received Retacrit for Hgb 9.8.  Iron studies pending and will replace if needed.  Will see her again in 4 weeks.  She will contact our office with any questions or concerns. We can certainly see her sooner if needed.   Laverna Peace, NP 2/22/20213:03 PM

## 2019-05-14 ENCOUNTER — Other Ambulatory Visit (HOSPITAL_COMMUNITY): Payer: Self-pay | Admitting: Pulmonary Disease

## 2019-05-14 ENCOUNTER — Other Ambulatory Visit: Payer: Self-pay | Admitting: Pulmonary Disease

## 2019-05-14 ENCOUNTER — Ambulatory Visit: Payer: Medicare Other | Admitting: Family

## 2019-05-14 ENCOUNTER — Other Ambulatory Visit: Payer: Medicare Other

## 2019-05-14 DIAGNOSIS — R634 Abnormal weight loss: Secondary | ICD-10-CM

## 2019-05-14 LAB — IRON AND TIBC
Iron: 101 ug/dL (ref 41–142)
Saturation Ratios: 27 % (ref 21–57)
TIBC: 376 ug/dL (ref 236–444)
UIBC: 276 ug/dL (ref 120–384)

## 2019-05-14 LAB — FERRITIN: Ferritin: 689 ng/mL — ABNORMAL HIGH (ref 11–307)

## 2019-05-24 ENCOUNTER — Other Ambulatory Visit: Payer: Self-pay

## 2019-05-24 ENCOUNTER — Ambulatory Visit (HOSPITAL_COMMUNITY): Admission: RE | Admit: 2019-05-24 | Payer: Medicare Other | Source: Ambulatory Visit

## 2019-05-24 ENCOUNTER — Ambulatory Visit (HOSPITAL_COMMUNITY)
Admission: RE | Admit: 2019-05-24 | Discharge: 2019-05-24 | Disposition: A | Discharge status: Home / Self Care | Payer: Medicare Other | Source: Ambulatory Visit | Attending: Pulmonary Disease | Admitting: Pulmonary Disease

## 2019-05-24 DIAGNOSIS — R634 Abnormal weight loss: Secondary | ICD-10-CM | POA: Insufficient documentation

## 2019-06-10 ENCOUNTER — Inpatient Hospital Stay: Payer: Medicare Other

## 2019-06-10 ENCOUNTER — Encounter: Payer: Self-pay | Admitting: Family

## 2019-06-10 ENCOUNTER — Other Ambulatory Visit: Payer: Self-pay

## 2019-06-10 ENCOUNTER — Inpatient Hospital Stay: Payer: Medicare Other | Attending: Family | Admitting: Family

## 2019-06-10 VITALS — BP 129/65 | HR 84 | Temp 97.3°F | Resp 18 | Wt 116.0 lb

## 2019-06-10 DIAGNOSIS — D631 Anemia in chronic kidney disease: Secondary | ICD-10-CM | POA: Diagnosis not present

## 2019-06-10 DIAGNOSIS — D500 Iron deficiency anemia secondary to blood loss (chronic): Secondary | ICD-10-CM

## 2019-06-10 DIAGNOSIS — N189 Chronic kidney disease, unspecified: Secondary | ICD-10-CM

## 2019-06-10 DIAGNOSIS — D508 Other iron deficiency anemias: Secondary | ICD-10-CM

## 2019-06-10 LAB — CMP (CANCER CENTER ONLY)
ALT: 23 U/L (ref 0–44)
AST: 35 U/L (ref 15–41)
Albumin: 4.5 g/dL (ref 3.5–5.0)
Alkaline Phosphatase: 29 U/L — ABNORMAL LOW (ref 38–126)
Anion gap: 7 (ref 5–15)
BUN: 47 mg/dL — ABNORMAL HIGH (ref 8–23)
CO2: 25 mmol/L (ref 22–32)
Calcium: 10.1 mg/dL (ref 8.9–10.3)
Chloride: 107 mmol/L (ref 98–111)
Creatinine: 1.45 mg/dL — ABNORMAL HIGH (ref 0.44–1.00)
GFR, Est AFR Am: 41 mL/min — ABNORMAL LOW (ref >60)
GFR, Estimated: 35 mL/min — ABNORMAL LOW (ref >60)
Glucose, Bld: 101 mg/dL — ABNORMAL HIGH (ref 70–99)
Potassium: 5 mmol/L (ref 3.5–5.1)
Sodium: 139 mmol/L (ref 135–145)
Total Bilirubin: 0.6 mg/dL (ref 0.3–1.2)
Total Protein: 7.6 g/dL (ref 6.5–8.1)

## 2019-06-10 LAB — RETICULOCYTES
Immature Retic Fract: 3.6 % (ref 2.3–15.9)
RBC.: 3.21 MIL/uL — ABNORMAL LOW (ref 3.87–5.11)
Retic Count, Absolute: 18.3 10*3/uL — ABNORMAL LOW (ref 19.0–186.0)
Retic Ct Pct: 0.6 % (ref 0.4–3.1)

## 2019-06-10 LAB — CBC WITH DIFFERENTIAL (CANCER CENTER ONLY)
Abs Immature Granulocytes: 0.02 10*3/uL (ref 0.00–0.07)
Basophils Absolute: 0.1 10*3/uL (ref 0.0–0.1)
Basophils Relative: 1 %
Eosinophils Absolute: 0.1 10*3/uL (ref 0.0–0.5)
Eosinophils Relative: 4 %
HCT: 33.3 % — ABNORMAL LOW (ref 36.0–46.0)
Hemoglobin: 10.4 g/dL — ABNORMAL LOW (ref 12.0–15.0)
Immature Granulocytes: 1 %
Lymphocytes Relative: 34 %
Lymphs Abs: 1.2 10*3/uL (ref 0.7–4.0)
MCH: 31.9 pg (ref 26.0–34.0)
MCHC: 31.2 g/dL (ref 30.0–36.0)
MCV: 102.1 fL — ABNORMAL HIGH (ref 80.0–100.0)
Monocytes Absolute: 0.3 10*3/uL (ref 0.1–1.0)
Monocytes Relative: 8 %
Neutro Abs: 1.9 10*3/uL (ref 1.7–7.7)
Neutrophils Relative %: 52 %
Platelet Count: 201 10*3/uL (ref 150–400)
RBC: 3.26 MIL/uL — ABNORMAL LOW (ref 3.87–5.11)
RDW: 13.8 % (ref 11.5–15.5)
WBC Count: 3.6 10*3/uL — ABNORMAL LOW (ref 4.0–10.5)
nRBC: 0 % (ref 0.0–0.2)

## 2019-06-10 MED ORDER — EPOETIN ALFA-EPBX 40000 UNIT/ML IJ SOLN
40000.0000 [IU] | Freq: Once | INTRAMUSCULAR | Status: AC
  Administered 2019-06-10: 40000 [IU] via SUBCUTANEOUS

## 2019-06-10 MED ORDER — EPOETIN ALFA-EPBX 40000 UNIT/ML IJ SOLN
INTRAMUSCULAR | Status: AC
  Filled 2019-06-10: qty 1

## 2019-06-10 NOTE — Progress Notes (Signed)
Hematology and Oncology Follow Up Visit  Rachael Davenport [id number] 1946/03/21 74 y.o. 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 She is doing well. She has mild fatigue at times but states that she is resting well at night.  Hgb is improved at 10.4, MCV 102.  She has not noted any recent blood loss. She will occasionally note a small bit of bright red blood if she strains with constipation. No other blood loss noted.  No bruising or petechiae.  No fever, chills, n/v, cough, rash, dizziness, SOB, chest pain, palpitations, abdominal pain or changes in bowel or bladder habits.  No swelling or tenderness in her extremities.  She has tingling in the right hands that comes and goes. She associates this with chronic neck issues.  No falls or syncopal episodes to report.  She has maintained a good appetite and is hydrating well throughout the day. Her weight is stable.   ECOG Performance Status: 1 - Symptomatic but completely ambulatory  Medications:  Allergies as of 06/10/2019      Reactions   Penicillins Itching, Swelling   Has patient had a PCN reaction causing immediate rash, facial/tongue/throat swelling, SOB or lightheadedness with hypotension: Yes Has patient had a PCN reaction causing severe rash involving mucus membranes or skin necrosis: No Has patient had a PCN reaction that required hospitalization: No Has patient had a PCN reaction occurring within the last 10 years: No If all of the above answers are "NO", then may proceed with Cephalosporin use.   Valsartan Other (See Comments), Itching   Heart sped up      Medication List       Accurate as of June 10, 2019  3:04 PM. If you have any questions, ask your nurse or doctor.        aspirin EC 81 MG  tablet Take 81 mg by mouth daily.   Fenofibric Acid 135 MG Cpdr fenofibric acid (choline) 135 mg capsule,delayed release   fluticasone 50 MCG/ACT nasal spray Commonly known as: FLONASE U 1 SPRAY IEN BID   Imvexxy Maintenance Pack 4 MCG Inst Generic drug: Estradiol Imvexxy Maintenance Pack 4 mcg vaginal insert  Insert 1 vaginal insert twice a week by vaginal route.   ipratropium 0.06 % nasal spray Commonly known as: ATROVENT Place into the nose.   olmesartan 20 MG tablet Commonly known as: BENICAR Take 20 mg by mouth daily.   Restasis 0.05 % ophthalmic emulsion Generic drug: cycloSPORINE INT 1 GTT IN OU BID UTD   SUMAtriptan 25 MG tablet Commonly known as: IMITREX Take 25 mg by mouth once as needed for migraine (MAY REPEAT ONCE IN 2 HOURS IF NO RELIEF (MAX 2 TABLETS/24 HOURS)).   Vitamin D (Ergocalciferol) 1.25 MG (50000 UNIT) Caps capsule Commonly known as: DRISDOL Take 50,000 Units by mouth every 7 (seven) days.   ergocalciferol 1.25 MG (50000 UT) capsule Commonly known as: VITAMIN D2 Take 50,000 Units by mouth once a week.       Allergies:  Allergies  Allergen Reactions  . Penicillins Itching and Swelling    Has patient had a PCN reaction causing immediate rash, facial/tongue/throat swelling, SOB or lightheadedness with hypotension: Yes Has patient had a PCN reaction causing severe rash involving mucus membranes or skin necrosis: No Has patient had a PCN reaction  that required hospitalization: No Has patient had a PCN reaction occurring within the last 10 years: No If all of the above answers are "NO", then may proceed with Cephalosporin use.   . Valsartan Other (See Comments) and Itching    Heart sped up    Past Medical History, Surgical history, Social history, and Family History were reviewed and updated.  Review of Systems: All other 10 point review of systems is negative.   Physical Exam:  vitals were not taken for this visit.   Wt Readings from  Last 3 Encounters:  05/13/19 115 lb 12.8 oz (52.5 kg)  04/16/19 114 lb 12.8 oz (52.1 kg)  03/19/19 115 lb 1.9 oz (52.2 kg)    Ocular: Sclerae unicteric, pupils equal, round and reactive to light Ear-nose-throat: Oropharynx clear, dentition fair Lymphatic: No cervical or supraclavicular adenopathy Lungs no rales or rhonchi, good excursion bilaterally Heart regular rate and rhythm, no murmur appreciated Abd soft, nontender, positive bowel sounds, no liver or spleen tip palpated on exam, no fluid wave  MSK no focal spinal tenderness, no joint edema Neuro: non-focal, well-oriented, appropriate affect Breasts: Deferred   Lab Results  Component Value Date   WBC 4.5 05/13/2019   HGB 9.8 (L) 05/13/2019   HCT 31.9 (L) 05/13/2019   MCV 103.9 (H) 05/13/2019   PLT 195 05/13/2019   Lab Results  Component Value Date   FERRITIN 689 (H) 05/13/2019   IRON 101 05/13/2019   TIBC 376 05/13/2019   UIBC 276 05/13/2019   IRONPCTSAT 27 05/13/2019   Lab Results  Component Value Date   RETICCTPCT 0.8 05/13/2019   RBC 3.06 (L) 05/13/2019   No results found for: KPAFRELGTCHN, LAMBDASER, KAPLAMBRATIO No results found for: IGGSERUM, IGA, IGMSERUM No results found for: Kathrynn Ducking, MSPIKE, SPEI   Chemistry      Component Value Date/Time   NA 140 05/13/2019 1416   NA 145 02/13/2017 1046   NA 142 12/23/2016 0951   K 5.2 (H) 05/13/2019 1416   K 4.6 02/13/2017 1046   K 3.9 12/23/2016 0951   CL 107 05/13/2019 1416   CL 108 02/13/2017 1046   CO2 27 05/13/2019 1416   CO2 28 02/13/2017 1046   CO2 25 12/23/2016 0951   BUN 40 (H) 05/13/2019 1416   BUN 22 02/13/2017 1046   BUN 15.0 12/23/2016 0951   CREATININE 1.40 (H) 05/13/2019 1416   CREATININE 1.1 02/13/2017 1046   CREATININE 0.9 12/23/2016 0951      Component Value Date/Time   CALCIUM 10.1 05/13/2019 1416   CALCIUM 9.7 02/13/2017 1046   CALCIUM 9.5 12/23/2016 0951   ALKPHOS 30 (L) 05/13/2019  1416   ALKPHOS 60 02/13/2017 1046   ALKPHOS 69 12/23/2016 0951   AST 29 05/13/2019 1416   AST 26 12/23/2016 0951   ALT 19 05/13/2019 1416   ALT 18 02/13/2017 1046   ALT 16 12/23/2016 0951   BILITOT 0.5 05/13/2019 1416   BILITOT 0.96 12/23/2016 0951      Impression and Plan:  Rachael Davenport is a very pleasant 74 yo African American female withanemia of chronic renal insufficiency and erythropoietin deficiency. She received her Retacrit todayfofr Hgb 10.4.  Iron studies are pending. We will plan to see her back in another month.  She will contact our office with any questions or concerns. We can certainly see her sooner if needed.    Laverna Peace, NP 3/22/20213:04 PM

## 2019-06-10 NOTE — Patient Instructions (Signed)

## 2019-06-11 LAB — IRON AND TIBC
Iron: 129 ug/dL (ref 41–142)
Saturation Ratios: 33 % (ref 21–57)
TIBC: 393 ug/dL (ref 236–444)
UIBC: 264 ug/dL (ref 120–384)

## 2019-06-11 LAB — FERRITIN: Ferritin: 787 ng/mL — ABNORMAL HIGH (ref 11–307)

## 2019-07-10 ENCOUNTER — Inpatient Hospital Stay: Payer: Medicare Other

## 2019-07-10 ENCOUNTER — Encounter: Payer: Self-pay | Admitting: Family

## 2019-07-10 ENCOUNTER — Inpatient Hospital Stay: Payer: Medicare Other | Attending: Family

## 2019-07-10 ENCOUNTER — Other Ambulatory Visit: Payer: Self-pay

## 2019-07-10 ENCOUNTER — Inpatient Hospital Stay (HOSPITAL_BASED_OUTPATIENT_CLINIC_OR_DEPARTMENT_OTHER): Payer: Medicare Other | Admitting: Family

## 2019-07-10 ENCOUNTER — Inpatient Hospital Stay: Payer: Medicare Other | Admitting: Family

## 2019-07-10 VITALS — BP 134/48 | HR 83 | Resp 17 | Ht 62.0 in | Wt 112.1 lb

## 2019-07-10 DIAGNOSIS — R634 Abnormal weight loss: Secondary | ICD-10-CM

## 2019-07-10 DIAGNOSIS — R5383 Other fatigue: Secondary | ICD-10-CM | POA: Diagnosis not present

## 2019-07-10 DIAGNOSIS — Z79899 Other long term (current) drug therapy: Secondary | ICD-10-CM | POA: Diagnosis not present

## 2019-07-10 DIAGNOSIS — D631 Anemia in chronic kidney disease: Secondary | ICD-10-CM | POA: Diagnosis not present

## 2019-07-10 DIAGNOSIS — D500 Iron deficiency anemia secondary to blood loss (chronic): Secondary | ICD-10-CM | POA: Diagnosis not present

## 2019-07-10 DIAGNOSIS — N189 Chronic kidney disease, unspecified: Secondary | ICD-10-CM | POA: Diagnosis not present

## 2019-07-10 LAB — CBC WITH DIFFERENTIAL (CANCER CENTER ONLY)
Abs Immature Granulocytes: 0.05 10*3/uL (ref 0.00–0.07)
Basophils Absolute: 0.1 10*3/uL (ref 0.0–0.1)
Basophils Relative: 1 %
Eosinophils Absolute: 0.1 10*3/uL (ref 0.0–0.5)
Eosinophils Relative: 2 %
HCT: 35 % — ABNORMAL LOW (ref 36.0–46.0)
Hemoglobin: 10.8 g/dL — ABNORMAL LOW (ref 12.0–15.0)
Immature Granulocytes: 1 %
Lymphocytes Relative: 24 %
Lymphs Abs: 1.2 10*3/uL (ref 0.7–4.0)
MCH: 31 pg (ref 26.0–34.0)
MCHC: 30.9 g/dL (ref 30.0–36.0)
MCV: 100.6 fL — ABNORMAL HIGH (ref 80.0–100.0)
Monocytes Absolute: 0.5 10*3/uL (ref 0.1–1.0)
Monocytes Relative: 10 %
Neutro Abs: 3.1 10*3/uL (ref 1.7–7.7)
Neutrophils Relative %: 62 %
Platelet Count: 205 10*3/uL (ref 150–400)
RBC: 3.48 MIL/uL — ABNORMAL LOW (ref 3.87–5.11)
RDW: 13.7 % (ref 11.5–15.5)
WBC Count: 5 10*3/uL (ref 4.0–10.5)
nRBC: 0 % (ref 0.0–0.2)

## 2019-07-10 LAB — CMP (CANCER CENTER ONLY)
ALT: 20 U/L (ref 0–44)
AST: 29 U/L (ref 15–41)
Albumin: 4.6 g/dL (ref 3.5–5.0)
Alkaline Phosphatase: 30 U/L — ABNORMAL LOW (ref 38–126)
Anion gap: 8 (ref 5–15)
BUN: 25 mg/dL — ABNORMAL HIGH (ref 8–23)
CO2: 26 mmol/L (ref 22–32)
Calcium: 10.2 mg/dL (ref 8.9–10.3)
Chloride: 107 mmol/L (ref 98–111)
Creatinine: 1.36 mg/dL — ABNORMAL HIGH (ref 0.44–1.00)
GFR, Est AFR Am: 44 mL/min — ABNORMAL LOW (ref >60)
GFR, Estimated: 38 mL/min — ABNORMAL LOW (ref >60)
Glucose, Bld: 99 mg/dL (ref 70–99)
Potassium: 4.4 mmol/L (ref 3.5–5.1)
Sodium: 141 mmol/L (ref 135–145)
Total Bilirubin: 0.7 mg/dL (ref 0.3–1.2)
Total Protein: 7.9 g/dL (ref 6.5–8.1)

## 2019-07-10 LAB — RETICULOCYTES
Immature Retic Fract: 5.2 % (ref 2.3–15.9)
RBC.: 3.45 MIL/uL — ABNORMAL LOW (ref 3.87–5.11)
Retic Count, Absolute: 29.3 10*3/uL (ref 19.0–186.0)
Retic Ct Pct: 0.9 % (ref 0.4–3.1)

## 2019-07-10 MED ORDER — EPOETIN ALFA-EPBX 40000 UNIT/ML IJ SOLN
40000.0000 [IU] | Freq: Once | INTRAMUSCULAR | Status: AC
  Administered 2019-07-10: 40000 [IU] via SUBCUTANEOUS

## 2019-07-10 MED ORDER — EPOETIN ALFA-EPBX 40000 UNIT/ML IJ SOLN
INTRAMUSCULAR | Status: AC
  Filled 2019-07-10: qty 1

## 2019-07-10 NOTE — Patient Instructions (Signed)

## 2019-07-10 NOTE — Addendum Note (Signed)
Addended by: Eliezer Bottom on: 07/10/2019 03:36 PM   Modules accepted: Orders

## 2019-07-10 NOTE — Progress Notes (Signed)
Hematology and Oncology Follow Up Visit  Rachael Davenport [id number] 1945/08/25 74 y.o. 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 She is doing well but has had some fatigue.  She is concerned about her 12 lb weight loss over the last 6 months. She is eating and supplementing with Boost. She would like to see a dietician.  She does feel that she is hydrating well.  No episodes of bleeding noted. No bruising or petechiae.  She denies fever, chills, n/v, cough, rash, dizziness, SOB, chest pain, palpitations, abdominal pain or changes in bowel or bladder habits.  She has had some tinging in her hands and is unsure if this has to due with neck issues. She plans to follow-up with her PCP.  No swelling or tenderness in her extremities Pedal pulses are 3+.  No falls or syncopal episodes to report.   ECOG Performance Status: 1 - Symptomatic but completely ambulatory  Medications:  Allergies as of 07/10/2019      Reactions   Penicillins Itching, Swelling   Has patient had a PCN reaction causing immediate rash, facial/tongue/throat swelling, SOB or lightheadedness with hypotension: Yes Has patient had a PCN reaction causing severe rash involving mucus membranes or skin necrosis: No Has patient had a PCN reaction that required hospitalization: No Has patient had a PCN reaction occurring within the last 10 years: No If all of the above answers are "NO", then may proceed with Cephalosporin use.   Valsartan Other (See Comments), Itching   Heart sped up      Medication List       Accurate as of July 10, 2019  1:17 PM. If you have any questions, ask your nurse or doctor.        aspirin EC 81 MG tablet Take 81 mg by mouth daily.   Fenofibric Acid 135 MG Cpdr fenofibric acid (choline) 135 mg  capsule,delayed release   fluticasone 50 MCG/ACT nasal spray Commonly known as: FLONASE U 1 SPRAY IEN BID   Imvexxy Maintenance Pack 4 MCG Inst Generic drug: Estradiol Imvexxy Maintenance Pack 4 mcg vaginal insert  Insert 1 vaginal insert twice a week by vaginal route.   ipratropium 0.06 % nasal spray Commonly known as: ATROVENT Place into the nose.   olmesartan 20 MG tablet Commonly known as: BENICAR Take 20 mg by mouth daily.   Restasis 0.05 % ophthalmic emulsion Generic drug: cycloSPORINE INT 1 GTT IN OU BID UTD   SUMAtriptan 25 MG tablet Commonly known as: IMITREX Take 25 mg by mouth once as needed for migraine (MAY REPEAT ONCE IN 2 HOURS IF NO RELIEF (MAX 2 TABLETS/24 HOURS)).   Vitamin D (Ergocalciferol) 1.25 MG (50000 UNIT) Caps capsule Commonly known as: DRISDOL Take 50,000 Units by mouth every 7 (seven) days.   ergocalciferol 1.25 MG (50000 UT) capsule Commonly known as: VITAMIN D2 Take 50,000 Units by mouth once a week.       Allergies:  Allergies  Allergen Reactions  . Penicillins Itching and Swelling    Has patient had a PCN reaction causing immediate rash, facial/tongue/throat swelling, SOB or lightheadedness with hypotension: Yes Has patient had a PCN reaction causing severe rash involving mucus membranes or skin necrosis: No Has patient had a PCN reaction that required hospitalization: No Has patient had a  PCN reaction occurring within the last 10 years: No If all of the above answers are "NO", then may proceed with Cephalosporin use.   . Valsartan Other (See Comments) and Itching    Heart sped up    Past Medical History, Surgical history, Social history, and Family History were reviewed and updated.  Review of Systems: All other 10 point review of systems is negative.   Physical Exam:  vitals were not taken for this visit.   Wt Readings from Last 3 Encounters:  06/10/19 116 lb (52.6 kg)  05/13/19 115 lb 12.8 oz (52.5 kg)  04/16/19 114 lb  12.8 oz (52.1 kg)    Ocular: Sclerae unicteric, pupils equal, round and reactive to light Ear-nose-throat: Oropharynx clear, dentition fair Lymphatic: No cervical or supraclavicular adenopathy Lungs no rales or rhonchi, good excursion bilaterally Heart regular rate and rhythm, no murmur appreciated Abd soft, nontender, positive bowel sounds, no liver or spleen tip palpated on exam, no fluid wave  MSK no focal spinal tenderness, no joint edema Neuro: non-focal, well-oriented, appropriate affect Breasts: Deferred   Lab Results  Component Value Date   WBC 5.0 07/10/2019   HGB 10.8 (L) 07/10/2019   HCT 35.0 (L) 07/10/2019   MCV 100.6 (H) 07/10/2019   PLT 205 07/10/2019   Lab Results  Component Value Date   FERRITIN 787 (H) 06/10/2019   IRON 129 06/10/2019   TIBC 393 06/10/2019   UIBC 264 06/10/2019   IRONPCTSAT 33 06/10/2019   Lab Results  Component Value Date   RETICCTPCT 0.9 07/10/2019   RBC 3.45 (L) 07/10/2019   No results found for: KPAFRELGTCHN, LAMBDASER, KAPLAMBRATIO No results found for: IGGSERUM, IGA, IGMSERUM No results found for: Kathrynn Ducking, MSPIKE, SPEI   Chemistry      Component Value Date/Time   NA 139 06/10/2019 1513   NA 145 02/13/2017 1046   NA 142 12/23/2016 0951   K 5.0 06/10/2019 1513   K 4.6 02/13/2017 1046   K 3.9 12/23/2016 0951   CL 107 06/10/2019 1513   CL 108 02/13/2017 1046   CO2 25 06/10/2019 1513   CO2 28 02/13/2017 1046   CO2 25 12/23/2016 0951   BUN 47 (H) 06/10/2019 1513   BUN 22 02/13/2017 1046   BUN 15.0 12/23/2016 0951   CREATININE 1.45 (H) 06/10/2019 1513   CREATININE 1.1 02/13/2017 1046   CREATININE 0.9 12/23/2016 0951      Component Value Date/Time   CALCIUM 10.1 06/10/2019 1513   CALCIUM 9.7 02/13/2017 1046   CALCIUM 9.5 12/23/2016 0951   ALKPHOS 29 (L) 06/10/2019 1513   ALKPHOS 60 02/13/2017 1046   ALKPHOS 69 12/23/2016 0951   AST 35 06/10/2019 1513   AST 26 12/23/2016  0951   ALT 23 06/10/2019 1513   ALT 18 02/13/2017 1046   ALT 16 12/23/2016 0951   BILITOT 0.6 06/10/2019 1513   BILITOT 0.96 12/23/2016 0951       Impression and Plan: Rachael Davenport is a very pleasant 74 yo African American female withanemia of chronic renal insufficiency and erythropoietin deficiency. Retacrit given today for Hgb 10.8.  Iron studies are pending. We will plan to see her back in another month.  She will contact our office with any questions or concerns. We can certainly see her sooner if needed.    Laverna Peace, NP 4/21/20211:17 PM

## 2019-07-11 LAB — IRON AND TIBC
Iron: 119 ug/dL (ref 41–142)
Saturation Ratios: 31 % (ref 21–57)
TIBC: 376 ug/dL (ref 236–444)
UIBC: 258 ug/dL (ref 120–384)

## 2019-07-11 LAB — FERRITIN: Ferritin: 767 ng/mL — ABNORMAL HIGH (ref 11–307)

## 2019-07-15 ENCOUNTER — Telehealth: Payer: Self-pay | Admitting: Nutrition

## 2019-07-16 ENCOUNTER — Inpatient Hospital Stay: Payer: Medicare Other | Admitting: Nutrition

## 2019-07-16 NOTE — Progress Notes (Signed)
74 year old female diagnosed with Anemia and CRI. She is a patient of Dr. Marin Olp.  PMH includes HTN and DM.  Medications include Vit D and Creon.  Labs reviewed.  Height: 5'2" Weight: 112 pounds. UBW: 124 pounds  BMI: 20.51  Patient denies change in eating habits.  No nutrition impact symptoms. Drinks one Boost High Protein daily. Generally eats 3 meals daily.  Nutrition Diagnosis: Unintended weight loss related to inadequate oral intake as evidenced by 12 pound wt loss from UBW.  Intervention: Educated to eat 3 high calorie, high protein meals and increase Boost High Protein to Boost Plus 2 times daily between meals. Educate to increase fluids as tolerated. Fact sheets emailed per patient request. Contact information provided. Questions answered.  Monitoring, Evaluation, Goals: Increase calories and protein to promote weight gain.  No follow up scheduled. Patient will contact RD for questions.

## 2019-08-09 ENCOUNTER — Inpatient Hospital Stay: Payer: Medicare Other

## 2019-08-09 ENCOUNTER — Telehealth: Payer: Self-pay | Admitting: Family

## 2019-08-09 ENCOUNTER — Inpatient Hospital Stay (HOSPITAL_BASED_OUTPATIENT_CLINIC_OR_DEPARTMENT_OTHER): Payer: Medicare Other | Admitting: Family

## 2019-08-09 ENCOUNTER — Other Ambulatory Visit: Payer: Self-pay

## 2019-08-09 ENCOUNTER — Encounter: Payer: Self-pay | Admitting: Family

## 2019-08-09 ENCOUNTER — Inpatient Hospital Stay: Payer: Medicare Other | Attending: Family

## 2019-08-09 VITALS — BP 109/53 | HR 72 | Temp 97.1°F | Resp 18 | Ht 62.0 in | Wt 117.0 lb

## 2019-08-09 DIAGNOSIS — D631 Anemia in chronic kidney disease: Secondary | ICD-10-CM

## 2019-08-09 DIAGNOSIS — D500 Iron deficiency anemia secondary to blood loss (chronic): Secondary | ICD-10-CM

## 2019-08-09 DIAGNOSIS — N189 Chronic kidney disease, unspecified: Secondary | ICD-10-CM | POA: Diagnosis not present

## 2019-08-09 LAB — CMP (CANCER CENTER ONLY)
ALT: 18 U/L (ref 0–44)
AST: 27 U/L (ref 15–41)
Albumin: 4.3 g/dL (ref 3.5–5.0)
Alkaline Phosphatase: 28 U/L — ABNORMAL LOW (ref 38–126)
Anion gap: 5 (ref 5–15)
BUN: 36 mg/dL — ABNORMAL HIGH (ref 8–23)
CO2: 29 mmol/L (ref 22–32)
Calcium: 10.1 mg/dL (ref 8.9–10.3)
Chloride: 104 mmol/L (ref 98–111)
Creatinine: 1.46 mg/dL — ABNORMAL HIGH (ref 0.44–1.00)
GFR, Est AFR Am: 41 mL/min — ABNORMAL LOW (ref >60)
GFR, Estimated: 35 mL/min — ABNORMAL LOW (ref >60)
Glucose, Bld: 122 mg/dL — ABNORMAL HIGH (ref 70–99)
Potassium: 5.1 mmol/L (ref 3.5–5.1)
Sodium: 138 mmol/L (ref 135–145)
Total Bilirubin: 0.5 mg/dL (ref 0.3–1.2)
Total Protein: 7.4 g/dL (ref 6.5–8.1)

## 2019-08-09 LAB — CBC WITH DIFFERENTIAL (CANCER CENTER ONLY)
Abs Immature Granulocytes: 0.06 10*3/uL (ref 0.00–0.07)
Basophils Absolute: 0.1 10*3/uL (ref 0.0–0.1)
Basophils Relative: 1 %
Eosinophils Absolute: 0.1 10*3/uL (ref 0.0–0.5)
Eosinophils Relative: 3 %
HCT: 32.6 % — ABNORMAL LOW (ref 36.0–46.0)
Hemoglobin: 10 g/dL — ABNORMAL LOW (ref 12.0–15.0)
Immature Granulocytes: 1 %
Lymphocytes Relative: 27 %
Lymphs Abs: 1.3 10*3/uL (ref 0.7–4.0)
MCH: 31 pg (ref 26.0–34.0)
MCHC: 30.7 g/dL (ref 30.0–36.0)
MCV: 100.9 fL — ABNORMAL HIGH (ref 80.0–100.0)
Monocytes Absolute: 0.4 10*3/uL (ref 0.1–1.0)
Monocytes Relative: 9 %
Neutro Abs: 2.9 10*3/uL (ref 1.7–7.7)
Neutrophils Relative %: 59 %
Platelet Count: 196 10*3/uL (ref 150–400)
RBC: 3.23 MIL/uL — ABNORMAL LOW (ref 3.87–5.11)
RDW: 13.7 % (ref 11.5–15.5)
WBC Count: 4.8 10*3/uL (ref 4.0–10.5)
nRBC: 0 % (ref 0.0–0.2)

## 2019-08-09 LAB — RETICULOCYTES
Immature Retic Fract: 9.5 % (ref 2.3–15.9)
RBC.: 3.23 MIL/uL — ABNORMAL LOW (ref 3.87–5.11)
Retic Count, Absolute: 25.2 10*3/uL (ref 19.0–186.0)
Retic Ct Pct: 0.8 % (ref 0.4–3.1)

## 2019-08-09 MED ORDER — EPOETIN ALFA-EPBX 40000 UNIT/ML IJ SOLN
40000.0000 [IU] | Freq: Once | INTRAMUSCULAR | Status: AC
  Administered 2019-08-09: 40000 [IU] via SUBCUTANEOUS

## 2019-08-09 MED ORDER — EPOETIN ALFA-EPBX 40000 UNIT/ML IJ SOLN
INTRAMUSCULAR | Status: AC
  Filled 2019-08-09: qty 1

## 2019-08-09 NOTE — Patient Instructions (Signed)

## 2019-08-09 NOTE — Telephone Encounter (Signed)
Appointments scheduled calendar printed per 5/21 los

## 2019-08-09 NOTE — Progress Notes (Signed)
Hematology and Oncology Follow Up Visit  Rachael Davenport [id number] 07-30-1945 74 y.o. 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 She is doing well but still has some fatigue.  She has not noted any episodes of bleeding. No bruising or petechiae.  No fever, chills, n/v, cough, rash, dizziness, SOB, chest pain, palpitations, abdominal pain or changes in bowel or bladder habits.  She has numbness and tingling in her right hand and states that she has an appointment with neurosurgeon Rachael Davenport coming up soon for further evaluation.   No swelling or tenderness in her extremities.  No falls or syncope.  She has maintained a good appetite but admits that she needs to better hydrate throughout the day.   ECOG Performance Status: 1 - Symptomatic but completely ambulatory  Medications:  Allergies as of 08/09/2019      Reactions   Penicillins Itching, Swelling   Has patient had a PCN reaction causing immediate rash, facial/tongue/throat swelling, SOB or lightheadedness with hypotension: Yes Has patient had a PCN reaction causing severe rash involving mucus membranes or skin necrosis: No Has patient had a PCN reaction that required hospitalization: No Has patient had a PCN reaction occurring within the last 10 years: No If all of the above answers are "NO", then may proceed with Cephalosporin use.   Valsartan Other (See Comments), Itching   Heart sped up      Medication List       Accurate as of Aug 09, 2019  1:31 PM. If you have any questions, ask your nurse or doctor.        aspirin EC 81 MG tablet Take 81 mg by mouth daily.   Creon 12000-38000 units Cpep capsule Generic drug: lipase/protease/amylase Creon 12,000-38,000-60,000 unit capsule,delayed release   Fenofibric Acid 135  MG Cpdr fenofibric acid (choline) 135 mg capsule,delayed release   fluticasone 50 MCG/ACT nasal spray Commonly known as: FLONASE U 1 SPRAY IEN BID   Imvexxy Maintenance Pack 4 MCG Inst Generic drug: Estradiol Imvexxy Maintenance Pack 4 mcg vaginal insert  Insert 1 vaginal insert twice a week by vaginal route.   ipratropium 0.06 % nasal spray Commonly known as: ATROVENT Place into the nose.   olmesartan 20 MG tablet Commonly known as: BENICAR Take 20 mg by mouth daily.   Restasis 0.05 % ophthalmic emulsion Generic drug: cycloSPORINE INT 1 GTT IN OU BID UTD   SUMAtriptan 25 MG tablet Commonly known as: IMITREX Take 25 mg by mouth once as needed for migraine (MAY REPEAT ONCE IN 2 HOURS IF NO RELIEF (MAX 2 TABLETS/24 HOURS)).   Vitamin D (Ergocalciferol) 1.25 MG (50000 UNIT) Caps capsule Commonly known as: DRISDOL Take 50,000 Units by mouth every 7 (seven) days.   ergocalciferol 1.25 MG (50000 UT) capsule Commonly known as: VITAMIN D2 Take 50,000 Units by mouth once a week.       Allergies:  Allergies  Allergen Reactions  . Penicillins Itching and Swelling    Has patient had a PCN reaction causing immediate rash, facial/tongue/throat swelling, SOB or lightheadedness with hypotension: Yes Has patient had a PCN reaction causing severe rash involving mucus membranes or skin necrosis: No Has patient had a PCN reaction that required hospitalization: No Has patient had a PCN reaction occurring within the last 10 years: No If all of the above answers are "  NO", then may proceed with Cephalosporin use.   . Valsartan Other (See Comments) and Itching    Heart sped up    Past Medical History, Surgical history, Social history, and Family History were reviewed and updated.  Review of Systems: All other 10 point review of systems is negative.   Physical Exam:  vitals were not taken for this visit.   Wt Readings from Last 3 Encounters:  07/10/19 112 lb 1.9 oz (50.9 kg)    06/10/19 116 lb (52.6 kg)  05/13/19 115 lb 12.8 oz (52.5 kg)    Ocular: Sclerae unicteric, pupils equal, round and reactive to light Ear-nose-throat: Oropharynx clear, dentition fair Lymphatic: No cervical or supraclavicular adenopathy Lungs no rales or rhonchi, good excursion bilaterally Heart regular rate and rhythm, no murmur appreciated Abd soft, nontender, positive bowel sounds, no liver or spleen tip palpated on exam, no fluid wave  MSK no focal spinal tenderness, no joint edema Neuro: non-focal, well-oriented, appropriate affect Breasts: Deferred   Lab Results  Component Value Date   WBC 4.8 08/09/2019   HGB 10.0 (L) 08/09/2019   HCT 32.6 (L) 08/09/2019   MCV 100.9 (H) 08/09/2019   PLT 196 08/09/2019   Lab Results  Component Value Date   FERRITIN 767 (H) 07/10/2019   IRON 119 07/10/2019   TIBC 376 07/10/2019   UIBC 258 07/10/2019   IRONPCTSAT 31 07/10/2019   Lab Results  Component Value Date   RETICCTPCT 0.8 08/09/2019   RBC 3.23 (L) 08/09/2019   No results found for: KPAFRELGTCHN, LAMBDASER, KAPLAMBRATIO No results found for: IGGSERUM, IGA, IGMSERUM No results found for: Kathrynn Ducking, MSPIKE, SPEI   Chemistry      Component Value Date/Time   NA 141 07/10/2019 1306   NA 145 02/13/2017 1046   NA 142 12/23/2016 0951   K 4.4 07/10/2019 1306   K 4.6 02/13/2017 1046   K 3.9 12/23/2016 0951   CL 107 07/10/2019 1306   CL 108 02/13/2017 1046   CO2 26 07/10/2019 1306   CO2 28 02/13/2017 1046   CO2 25 12/23/2016 0951   BUN 25 (H) 07/10/2019 1306   BUN 22 02/13/2017 1046   BUN 15.0 12/23/2016 0951   CREATININE 1.36 (H) 07/10/2019 1306   CREATININE 1.1 02/13/2017 1046   CREATININE 0.9 12/23/2016 0951      Component Value Date/Time   CALCIUM 10.2 07/10/2019 1306   CALCIUM 9.7 02/13/2017 1046   CALCIUM 9.5 12/23/2016 0951   ALKPHOS 30 (L) 07/10/2019 1306   ALKPHOS 60 02/13/2017 1046   ALKPHOS 69 12/23/2016 0951    AST 29 07/10/2019 1306   AST 26 12/23/2016 0951   ALT 20 07/10/2019 1306   ALT 18 02/13/2017 1046   ALT 16 12/23/2016 0951   BILITOT 0.7 07/10/2019 1306   BILITOT 0.96 12/23/2016 0951       Impression and Plan: Rachael Davenport is a very pleasant 74 yo African American female withanemia of chronic renal insufficiency and erythropoietin deficiency. Retacrit given today for Hgb 10.0.  Iron studies are pending. We will plan to see her back in another 3 weeks.  She will contact our office with any questions or concerns. We can certainly see her sooner if needed.  Laverna Peace, NP 5/21/20211:31 PM

## 2019-08-12 LAB — IRON AND TIBC
Iron: 89 ug/dL (ref 41–142)
Saturation Ratios: 24 % (ref 21–57)
TIBC: 373 ug/dL (ref 236–444)
UIBC: 284 ug/dL (ref 120–384)

## 2019-08-12 LAB — FERRITIN: Ferritin: 599 ng/mL — ABNORMAL HIGH (ref 11–307)

## 2019-09-02 ENCOUNTER — Encounter: Payer: Self-pay | Admitting: Family

## 2019-09-02 ENCOUNTER — Inpatient Hospital Stay (HOSPITAL_BASED_OUTPATIENT_CLINIC_OR_DEPARTMENT_OTHER): Payer: Medicare Other | Admitting: Family

## 2019-09-02 ENCOUNTER — Inpatient Hospital Stay: Payer: Medicare Other

## 2019-09-02 ENCOUNTER — Inpatient Hospital Stay: Payer: Medicare Other | Attending: Family

## 2019-09-02 ENCOUNTER — Other Ambulatory Visit: Payer: Self-pay

## 2019-09-02 VITALS — BP 110/54 | HR 81 | Temp 97.6°F | Resp 18 | Ht 62.0 in | Wt 117.1 lb

## 2019-09-02 DIAGNOSIS — D631 Anemia in chronic kidney disease: Secondary | ICD-10-CM | POA: Diagnosis not present

## 2019-09-02 DIAGNOSIS — D500 Iron deficiency anemia secondary to blood loss (chronic): Secondary | ICD-10-CM

## 2019-09-02 DIAGNOSIS — N189 Chronic kidney disease, unspecified: Secondary | ICD-10-CM | POA: Diagnosis not present

## 2019-09-02 LAB — CMP (CANCER CENTER ONLY)
ALT: 21 U/L (ref 0–44)
AST: 32 U/L (ref 15–41)
Albumin: 4.3 g/dL (ref 3.5–5.0)
Alkaline Phosphatase: 25 U/L — ABNORMAL LOW (ref 38–126)
Anion gap: 6 (ref 5–15)
BUN: 49 mg/dL — ABNORMAL HIGH (ref 8–23)
CO2: 27 mmol/L (ref 22–32)
Calcium: 10 mg/dL (ref 8.9–10.3)
Chloride: 107 mmol/L (ref 98–111)
Creatinine: 1.47 mg/dL — ABNORMAL HIGH (ref 0.44–1.00)
GFR, Est AFR Am: 40 mL/min — ABNORMAL LOW (ref >60)
GFR, Estimated: 35 mL/min — ABNORMAL LOW (ref >60)
Glucose, Bld: 141 mg/dL — ABNORMAL HIGH (ref 70–99)
Potassium: 5 mmol/L (ref 3.5–5.1)
Sodium: 140 mmol/L (ref 135–145)
Total Bilirubin: 0.5 mg/dL (ref 0.3–1.2)
Total Protein: 7.1 g/dL (ref 6.5–8.1)

## 2019-09-02 LAB — CBC WITH DIFFERENTIAL (CANCER CENTER ONLY)
Abs Immature Granulocytes: 0.02 10*3/uL (ref 0.00–0.07)
Basophils Absolute: 0.1 10*3/uL (ref 0.0–0.1)
Basophils Relative: 1 %
Eosinophils Absolute: 0.1 10*3/uL (ref 0.0–0.5)
Eosinophils Relative: 3 %
HCT: 32.8 % — ABNORMAL LOW (ref 36.0–46.0)
Hemoglobin: 10 g/dL — ABNORMAL LOW (ref 12.0–15.0)
Immature Granulocytes: 1 %
Lymphocytes Relative: 26 %
Lymphs Abs: 1 10*3/uL (ref 0.7–4.0)
MCH: 31.3 pg (ref 26.0–34.0)
MCHC: 30.5 g/dL (ref 30.0–36.0)
MCV: 102.8 fL — ABNORMAL HIGH (ref 80.0–100.0)
Monocytes Absolute: 0.4 10*3/uL (ref 0.1–1.0)
Monocytes Relative: 9 %
Neutro Abs: 2.4 10*3/uL (ref 1.7–7.7)
Neutrophils Relative %: 60 %
Platelet Count: 164 10*3/uL (ref 150–400)
RBC: 3.19 MIL/uL — ABNORMAL LOW (ref 3.87–5.11)
RDW: 14.6 % (ref 11.5–15.5)
WBC Count: 4 10*3/uL (ref 4.0–10.5)
nRBC: 0 % (ref 0.0–0.2)

## 2019-09-02 LAB — RETICULOCYTES
Immature Retic Fract: 4.5 % (ref 2.3–15.9)
RBC.: 3.16 MIL/uL — ABNORMAL LOW (ref 3.87–5.11)
Retic Count, Absolute: 17.7 10*3/uL — ABNORMAL LOW (ref 19.0–186.0)
Retic Ct Pct: 0.6 % (ref 0.4–3.1)

## 2019-09-02 MED ORDER — EPOETIN ALFA-EPBX 40000 UNIT/ML IJ SOLN
INTRAMUSCULAR | Status: AC
  Filled 2019-09-02: qty 1

## 2019-09-02 MED ORDER — EPOETIN ALFA-EPBX 40000 UNIT/ML IJ SOLN
40000.0000 [IU] | Freq: Once | INTRAMUSCULAR | Status: AC
  Administered 2019-09-02: 40000 [IU] via SUBCUTANEOUS

## 2019-09-02 NOTE — Progress Notes (Signed)
Hematology and Oncology Follow Up Visit  Rachael Davenport [id number] 07/16/1945 74 y.o. 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 She is doing well but does feel fatigued.  She occasionally has bright red blood on her toilet tissue. She feels this may be due to hemorrhoids. She states that her next colonoscopy is due in 2022 but she plans to ask her PCP next week at her routine visit if this can be moved up.  No other bleeding noted. No bruising or petechiae.  Hgb is stable at 10.0, MCV 100.  No fever, chills, n/v, cough, rash, dizziness, SOB, chest pain, palpitations, abdominal pain or changes in bowe or bladder habits.  No swelling, tenderness, numbness or tingling in her extremities at this time.  No falls or syncope.  She has maintained a good appetite and is staying well hydrated. Her weight is stable.   ECOG Performance Status: 1 - Symptomatic but completely ambulatory  Medications:  Allergies as of 09/02/2019      Reactions   Penicillins Itching, Swelling   Has patient had a PCN reaction causing immediate rash, facial/tongue/throat swelling, SOB or lightheadedness with hypotension: Yes Has patient had a PCN reaction causing severe rash involving mucus membranes or skin necrosis: No Has patient had a PCN reaction that required hospitalization: No Has patient had a PCN reaction occurring within the last 10 years: No If all of the above answers are "NO", then may proceed with Cephalosporin use.   Valsartan Other (See Comments), Itching   Heart sped up      Medication List       Accurate as of September 02, 2019  2:15 PM. If you have any questions, ask your nurse or doctor.        aspirin EC 81 MG tablet Take 81 mg by mouth daily.   Creon 12000-38000 units Cpep capsule Generic  drug: lipase/protease/amylase Creon 12,000-38,000-60,000 unit capsule,delayed release   Fenofibric Acid 135 MG Cpdr fenofibric acid (choline) 135 mg capsule,delayed release   fluticasone 50 MCG/ACT nasal spray Commonly known as: FLONASE U 1 SPRAY IEN BID   Imvexxy Maintenance Pack 4 MCG Inst Generic drug: Estradiol Imvexxy Maintenance Pack 4 mcg vaginal insert  Insert 1 vaginal insert twice a week by vaginal route.   ipratropium 0.06 % nasal spray Commonly known as: ATROVENT Place into the nose.   olmesartan 20 MG tablet Commonly known as: BENICAR Take 20 mg by mouth daily.   Restasis 0.05 % ophthalmic emulsion Generic drug: cycloSPORINE INT 1 GTT IN OU BID UTD   SUMAtriptan 25 MG tablet Commonly known as: IMITREX Take 25 mg by mouth once as needed for migraine (MAY REPEAT ONCE IN 2 HOURS IF NO RELIEF (MAX 2 TABLETS/24 HOURS)).   Vitamin D (Ergocalciferol) 1.25 MG (50000 UNIT) Caps capsule Commonly known as: DRISDOL Take 50,000 Units by mouth every 7 (seven) days.   ergocalciferol 1.25 MG (50000 UT) capsule Commonly known as: VITAMIN D2 Take 50,000 Units by mouth once a week.       Allergies:  Allergies  Allergen Reactions  . Penicillins Itching and Swelling    Has patient had a PCN reaction causing immediate rash, facial/tongue/throat swelling, SOB or lightheadedness with hypotension: Yes Has patient had a PCN reaction causing severe rash involving mucus membranes or skin necrosis: No Has patient had a  PCN reaction that required hospitalization: No Has patient had a PCN reaction occurring within the last 10 years: No If all of the above answers are "NO", then may proceed with Cephalosporin use.   . Valsartan Other (See Comments) and Itching    Heart sped up    Past Medical History, Surgical history, Social history, and Family History were reviewed and updated.  Review of Systems: All other 10 point review of systems is negative.   Physical Exam:  vitals  were not taken for this visit.   Wt Readings from Last 3 Encounters:  08/09/19 117 lb (53.1 kg)  07/10/19 112 lb 1.9 oz (50.9 kg)  06/10/19 116 lb (52.6 kg)    Ocular: Sclerae unicteric, pupils equal, round and reactive to light Ear-nose-throat: Oropharynx clear, dentition fair Lymphatic: No cervical or supraclavicular adenopathy Lungs no rales or rhonchi, good excursion bilaterally Heart regular rate and rhythm, no murmur appreciated Abd soft, nontender, positive bowel sounds, no liver or spleen tip palpated on exam, no fluid wave  MSK no focal spinal tenderness, no joint edema Neuro: non-focal, well-oriented, appropriate affect Breasts: Deferred   Lab Results  Component Value Date   WBC 4.0 09/02/2019   HGB 10.0 (L) 09/02/2019   HCT 32.8 (L) 09/02/2019   MCV 102.8 (H) 09/02/2019   PLT 164 09/02/2019   Lab Results  Component Value Date   FERRITIN 599 (H) 08/09/2019   IRON 89 08/09/2019   TIBC 373 08/09/2019   UIBC 284 08/09/2019   IRONPCTSAT 24 08/09/2019   Lab Results  Component Value Date   RETICCTPCT 0.6 09/02/2019   RBC 3.16 (L) 09/02/2019   RBC 3.19 (L) 09/02/2019   No results found for: KPAFRELGTCHN, LAMBDASER, KAPLAMBRATIO No results found for: IGGSERUM, IGA, IGMSERUM No results found for: Kathrynn Ducking, MSPIKE, SPEI   Chemistry      Component Value Date/Time   NA 138 08/09/2019 1316   NA 145 02/13/2017 1046   NA 142 12/23/2016 0951   K 5.1 08/09/2019 1316   K 4.6 02/13/2017 1046   K 3.9 12/23/2016 0951   CL 104 08/09/2019 1316   CL 108 02/13/2017 1046   CO2 29 08/09/2019 1316   CO2 28 02/13/2017 1046   CO2 25 12/23/2016 0951   BUN 36 (H) 08/09/2019 1316   BUN 22 02/13/2017 1046   BUN 15.0 12/23/2016 0951   CREATININE 1.46 (H) 08/09/2019 1316   CREATININE 1.1 02/13/2017 1046   CREATININE 0.9 12/23/2016 0951      Component Value Date/Time   CALCIUM 10.1 08/09/2019 1316   CALCIUM 9.7 02/13/2017 1046     CALCIUM 9.5 12/23/2016 0951   ALKPHOS 28 (L) 08/09/2019 1316   ALKPHOS 60 02/13/2017 1046   ALKPHOS 69 12/23/2016 0951   AST 27 08/09/2019 1316   AST 26 12/23/2016 0951   ALT 18 08/09/2019 1316   ALT 18 02/13/2017 1046   ALT 16 12/23/2016 0951   BILITOT 0.5 08/09/2019 1316   BILITOT 0.96 12/23/2016 0951       Impression and Plan: Rachael Davenport is a very pleasant 74 yo African American female withanemia of chronic renal insufficiency and erythropoietin deficiency. Retacrit given today for Hgb 10.0. Iron studies are pending. We will replace if needed.  We will plan to see her back in another 3 weeks for lab and injection only and follow-up in 6 weeks.  She will contact our office with any questions or concerns. We can certainly see  her sooner if needed.  Laverna Peace, NP 6/14/20212:15 PM

## 2019-09-02 NOTE — Patient Instructions (Signed)

## 2019-09-03 LAB — IRON AND TIBC
Iron: 128 ug/dL (ref 41–142)
Saturation Ratios: 34 % (ref 21–57)
TIBC: 381 ug/dL (ref 236–444)
UIBC: 253 ug/dL (ref 120–384)

## 2019-09-03 LAB — FERRITIN: Ferritin: 587 ng/mL — ABNORMAL HIGH (ref 11–307)

## 2019-09-24 ENCOUNTER — Inpatient Hospital Stay: Payer: Medicare Other | Attending: Family

## 2019-09-24 ENCOUNTER — Other Ambulatory Visit: Payer: Self-pay

## 2019-09-24 ENCOUNTER — Inpatient Hospital Stay: Payer: Medicare Other

## 2019-09-24 VITALS — BP 112/69 | HR 70 | Resp 16

## 2019-09-24 DIAGNOSIS — D631 Anemia in chronic kidney disease: Secondary | ICD-10-CM | POA: Insufficient documentation

## 2019-09-24 DIAGNOSIS — D500 Iron deficiency anemia secondary to blood loss (chronic): Secondary | ICD-10-CM

## 2019-09-24 DIAGNOSIS — N189 Chronic kidney disease, unspecified: Secondary | ICD-10-CM | POA: Diagnosis present

## 2019-09-24 LAB — CMP (CANCER CENTER ONLY)
ALT: 20 U/L (ref 0–44)
AST: 30 U/L (ref 15–41)
Albumin: 4.1 g/dL (ref 3.5–5.0)
Alkaline Phosphatase: 29 U/L — ABNORMAL LOW (ref 38–126)
Anion gap: 9 (ref 5–15)
BUN: 41 mg/dL — ABNORMAL HIGH (ref 8–23)
CO2: 23 mmol/L (ref 22–32)
Calcium: 9.5 mg/dL (ref 8.9–10.3)
Chloride: 105 mmol/L (ref 98–111)
Creatinine: 1.25 mg/dL — ABNORMAL HIGH (ref 0.44–1.00)
GFR, Est AFR Am: 49 mL/min — ABNORMAL LOW (ref >60)
GFR, Estimated: 42 mL/min — ABNORMAL LOW (ref >60)
Glucose, Bld: 99 mg/dL (ref 70–99)
Potassium: 4.6 mmol/L (ref 3.5–5.1)
Sodium: 137 mmol/L (ref 135–145)
Total Bilirubin: 0.7 mg/dL (ref 0.3–1.2)
Total Protein: 7.3 g/dL (ref 6.5–8.1)

## 2019-09-24 LAB — CBC WITH DIFFERENTIAL (CANCER CENTER ONLY)
Abs Immature Granulocytes: 0.01 10*3/uL (ref 0.00–0.07)
Basophils Absolute: 0 10*3/uL (ref 0.0–0.1)
Basophils Relative: 1 %
Eosinophils Absolute: 0.1 10*3/uL (ref 0.0–0.5)
Eosinophils Relative: 3 %
HCT: 34.3 % — ABNORMAL LOW (ref 36.0–46.0)
Hemoglobin: 10.3 g/dL — ABNORMAL LOW (ref 12.0–15.0)
Immature Granulocytes: 0 %
Lymphocytes Relative: 30 %
Lymphs Abs: 1.2 10*3/uL (ref 0.7–4.0)
MCH: 30.9 pg (ref 26.0–34.0)
MCHC: 30 g/dL (ref 30.0–36.0)
MCV: 103 fL — ABNORMAL HIGH (ref 80.0–100.0)
Monocytes Absolute: 0.4 10*3/uL (ref 0.1–1.0)
Monocytes Relative: 10 %
Neutro Abs: 2.2 10*3/uL (ref 1.7–7.7)
Neutrophils Relative %: 56 %
Platelet Count: 165 10*3/uL (ref 150–400)
RBC: 3.33 MIL/uL — ABNORMAL LOW (ref 3.87–5.11)
RDW: 14.6 % (ref 11.5–15.5)
WBC Count: 4 10*3/uL (ref 4.0–10.5)
nRBC: 0 % (ref 0.0–0.2)

## 2019-09-24 MED ORDER — EPOETIN ALFA-EPBX 40000 UNIT/ML IJ SOLN
40000.0000 [IU] | Freq: Once | INTRAMUSCULAR | Status: AC
  Administered 2019-09-24: 40000 [IU] via SUBCUTANEOUS

## 2019-09-24 MED ORDER — EPOETIN ALFA-EPBX 40000 UNIT/ML IJ SOLN
INTRAMUSCULAR | Status: AC
  Filled 2019-09-24: qty 1

## 2019-09-24 MED ORDER — SODIUM CHLORIDE 0.9 % IV SOLN: 200.0000 mg | Freq: Once | INTRAVENOUS | Status: DC

## 2019-09-24 NOTE — Patient Instructions (Signed)

## 2019-10-15 ENCOUNTER — Inpatient Hospital Stay: Payer: Medicare Other

## 2019-10-15 ENCOUNTER — Inpatient Hospital Stay (HOSPITAL_BASED_OUTPATIENT_CLINIC_OR_DEPARTMENT_OTHER): Payer: Medicare Other | Admitting: Family

## 2019-10-15 ENCOUNTER — Other Ambulatory Visit: Payer: Self-pay

## 2019-10-15 ENCOUNTER — Encounter: Payer: Self-pay | Admitting: Family

## 2019-10-15 VITALS — BP 135/58 | HR 67 | Temp 97.6°F | Resp 17 | Ht 62.0 in | Wt 118.8 lb

## 2019-10-15 VITALS — BP 135/58 | HR 67 | Resp 17 | Ht 62.0 in | Wt 118.1 lb

## 2019-10-15 DIAGNOSIS — D631 Anemia in chronic kidney disease: Secondary | ICD-10-CM

## 2019-10-15 DIAGNOSIS — N189 Chronic kidney disease, unspecified: Secondary | ICD-10-CM | POA: Diagnosis not present

## 2019-10-15 DIAGNOSIS — D500 Iron deficiency anemia secondary to blood loss (chronic): Secondary | ICD-10-CM

## 2019-10-15 LAB — CMP (CANCER CENTER ONLY)
ALT: 20 U/L (ref 0–44)
AST: 28 U/L (ref 15–41)
Albumin: 4.3 g/dL (ref 3.5–5.0)
Alkaline Phosphatase: 29 U/L — ABNORMAL LOW (ref 38–126)
Anion gap: 6 (ref 5–15)
BUN: 26 mg/dL — ABNORMAL HIGH (ref 8–23)
CO2: 28 mmol/L (ref 22–32)
Calcium: 10 mg/dL (ref 8.9–10.3)
Chloride: 103 mmol/L (ref 98–111)
Creatinine: 1.23 mg/dL — ABNORMAL HIGH (ref 0.44–1.00)
GFR, Est AFR Am: 50 mL/min — ABNORMAL LOW (ref >60)
GFR, Estimated: 43 mL/min — ABNORMAL LOW (ref >60)
Glucose, Bld: 114 mg/dL — ABNORMAL HIGH (ref 70–99)
Potassium: 4.7 mmol/L (ref 3.5–5.1)
Sodium: 137 mmol/L (ref 135–145)
Total Bilirubin: 0.8 mg/dL (ref 0.3–1.2)
Total Protein: 7.3 g/dL (ref 6.5–8.1)

## 2019-10-15 LAB — CBC WITH DIFFERENTIAL (CANCER CENTER ONLY)
Abs Immature Granulocytes: 0 10*3/uL (ref 0.00–0.07)
Basophils Absolute: 0.1 10*3/uL (ref 0.0–0.1)
Basophils Relative: 1 %
Eosinophils Absolute: 0.1 10*3/uL (ref 0.0–0.5)
Eosinophils Relative: 3 %
HCT: 35.1 % — ABNORMAL LOW (ref 36.0–46.0)
Hemoglobin: 10.8 g/dL — ABNORMAL LOW (ref 12.0–15.0)
Immature Granulocytes: 0 %
Lymphocytes Relative: 32 %
Lymphs Abs: 1.2 10*3/uL (ref 0.7–4.0)
MCH: 31.3 pg (ref 26.0–34.0)
MCHC: 30.8 g/dL (ref 30.0–36.0)
MCV: 101.7 fL — ABNORMAL HIGH (ref 80.0–100.0)
Monocytes Absolute: 0.3 10*3/uL (ref 0.1–1.0)
Monocytes Relative: 9 %
Neutro Abs: 2 10*3/uL (ref 1.7–7.7)
Neutrophils Relative %: 55 %
Platelet Count: 171 10*3/uL (ref 150–400)
RBC: 3.45 MIL/uL — ABNORMAL LOW (ref 3.87–5.11)
RDW: 14.2 % (ref 11.5–15.5)
WBC Count: 3.7 10*3/uL — ABNORMAL LOW (ref 4.0–10.5)
nRBC: 0 % (ref 0.0–0.2)

## 2019-10-15 MED ORDER — EPOETIN ALFA-EPBX 40000 UNIT/ML IJ SOLN
INTRAMUSCULAR | Status: AC
  Filled 2019-10-15: qty 1

## 2019-10-15 MED ORDER — EPOETIN ALFA-EPBX 40000 UNIT/ML IJ SOLN
40000.0000 [IU] | Freq: Once | INTRAMUSCULAR | Status: AC
  Administered 2019-10-15: 40000 [IU] via SUBCUTANEOUS

## 2019-10-15 NOTE — Progress Notes (Signed)
Hematology and Oncology Follow Up Visit  Rachael Davenport [id number] 1946/03/02 74 y.o. 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 She is doing well but still has some constipation. She plans to see GI next week on Monday and hopefully schedule her colonoscopy.  She has not noted any episodes of bleeding. No bruising or petechiae.  Hgb 10.8, MCV 101.  No fever, chills, n/v, cough, rash, dizziness, SOB, chest pain, palpitations, abdominal pain or changes in bowel or bladder habits.  No swelling or tenderness in her extremities.  No falls or syncopal episodes to report.  She is eating well and has added a Boost daily. She is staying hydrated and her weight is stable.   ECOG Performance Status: 1 - Symptomatic but completely ambulatory  Medications:  Allergies as of 10/15/2019      Reactions   Penicillins Itching, Swelling   Has patient had a PCN reaction causing immediate rash, facial/tongue/throat swelling, SOB or lightheadedness with hypotension: Yes Has patient had a PCN reaction causing severe rash involving mucus membranes or skin necrosis: No Has patient had a PCN reaction that required hospitalization: No Has patient had a PCN reaction occurring within the last 10 years: No If all of the above answers are "NO", then may proceed with Cephalosporin use.   Valsartan Other (See Comments), Itching   Heart sped up      Medication List       Accurate as of October 15, 2019  2:47 PM. If you have any questions, ask your nurse or doctor.        aspirin EC 81 MG tablet Take 81 mg by mouth daily.   Creon 12000-38000 units Cpep capsule Generic drug: lipase/protease/amylase Creon 12,000-38,000-60,000 unit capsule,delayed release   Fenofibric Acid 135 MG Cpdr fenofibric acid (choline)  135 mg capsule,delayed release   fluticasone 50 MCG/ACT nasal spray Commonly known as: FLONASE U 1 SPRAY IEN BID   Imvexxy Maintenance Pack 4 MCG Inst Generic drug: Estradiol Imvexxy Maintenance Pack 4 mcg vaginal insert  Insert 1 vaginal insert twice a week by vaginal route.   ipratropium 0.06 % nasal spray Commonly known as: ATROVENT Place into the nose.   olmesartan 20 MG tablet Commonly known as: BENICAR Take 20 mg by mouth daily.   Restasis 0.05 % ophthalmic emulsion Generic drug: cycloSPORINE INT 1 GTT IN OU BID UTD   SUMAtriptan 25 MG tablet Commonly known as: IMITREX Take 25 mg by mouth once as needed for migraine (MAY REPEAT ONCE IN 2 HOURS IF NO RELIEF (MAX 2 TABLETS/24 HOURS)).   Vitamin D (Ergocalciferol) 1.25 MG (50000 UNIT) Caps capsule Commonly known as: DRISDOL Take 50,000 Units by mouth every 7 (seven) days.   ergocalciferol 1.25 MG (50000 UT) capsule Commonly known as: VITAMIN D2 Take 50,000 Units by mouth once a week.       Allergies:  Allergies  Allergen Reactions  . Penicillins Itching and Swelling    Has patient had a PCN reaction causing immediate rash, facial/tongue/throat swelling, SOB or lightheadedness with hypotension: Yes Has patient had a PCN reaction causing severe rash involving mucus membranes or skin necrosis: No Has patient had a PCN reaction that required hospitalization: No Has patient had a PCN reaction occurring within the last 10 years: No If all of the above answers are "NO", then may proceed  with Cephalosporin use.   . Valsartan Other (See Comments) and Itching    Heart sped up    Past Medical History, Surgical history, Social history, and Family History were reviewed and updated.  Review of Systems: All other 10 point review of systems is negative.   Physical Exam:  height is 5\' 2"  (1.575 m) and weight is 118 lb 12.8 oz (53.9 kg). Her oral temperature is 97.6 F (36.4 C). Her blood pressure is 135/58 (abnormal) and  her pulse is 67. Her respiration is 17 and oxygen saturation is 100%.   Wt Readings from Last 3 Encounters:  10/15/19 118 lb 12.8 oz (53.9 kg)  10/15/19 118 lb 1.3 oz (53.6 kg)  09/02/19 117 lb 1.9 oz (53.1 kg)    Ocular: Sclerae unicteric, pupils equal, round and reactive to light Ear-nose-throat: Oropharynx clear, dentition fair Lymphatic: No cervical or supraclavicular adenopathy Lungs no rales or rhonchi, good excursion bilaterally Heart regular rate and rhythm, no murmur appreciated Abd soft, nontender, positive bowel sounds, no liver or spleen tip palpated on exam, no fluid wave  MSK no focal spinal tenderness, no joint edema Neuro: non-focal, well-oriented, appropriate affect Breasts: Deferred   Lab Results  Component Value Date   WBC 3.7 (L) 10/15/2019   HGB 10.8 (L) 10/15/2019   HCT 35.1 (L) 10/15/2019   MCV 101.7 (H) 10/15/2019   PLT 171 10/15/2019   Lab Results  Component Value Date   FERRITIN 587 (H) 09/02/2019   IRON 128 09/02/2019   TIBC 381 09/02/2019   UIBC 253 09/02/2019   IRONPCTSAT 34 09/02/2019   Lab Results  Component Value Date   RETICCTPCT 0.6 09/02/2019   RBC 3.45 (L) 10/15/2019   No results found for: KPAFRELGTCHN, LAMBDASER, KAPLAMBRATIO No results found for: IGGSERUM, IGA, IGMSERUM No results found for: Kathrynn Ducking, MSPIKE, SPEI   Chemistry      Component Value Date/Time   NA 137 10/15/2019 1348   NA 145 02/13/2017 1046   NA 142 12/23/2016 0951   K 4.7 10/15/2019 1348   K 4.6 02/13/2017 1046   K 3.9 12/23/2016 0951   CL 103 10/15/2019 1348   CL 108 02/13/2017 1046   CO2 28 10/15/2019 1348   CO2 28 02/13/2017 1046   CO2 25 12/23/2016 0951   BUN 26 (H) 10/15/2019 1348   BUN 22 02/13/2017 1046   BUN 15.0 12/23/2016 0951   CREATININE 1.23 (H) 10/15/2019 1348   CREATININE 1.1 02/13/2017 1046   CREATININE 0.9 12/23/2016 0951      Component Value Date/Time   CALCIUM 10.0 10/15/2019 1348     CALCIUM 9.7 02/13/2017 1046   CALCIUM 9.5 12/23/2016 0951   ALKPHOS 29 (L) 10/15/2019 1348   ALKPHOS 60 02/13/2017 1046   ALKPHOS 69 12/23/2016 0951   AST 28 10/15/2019 1348   AST 26 12/23/2016 0951   ALT 20 10/15/2019 1348   ALT 18 02/13/2017 1046   ALT 16 12/23/2016 0951   BILITOT 0.8 10/15/2019 1348   BILITOT 0.96 12/23/2016 0951       Impression and Plan: Ms. Zendejas is a very pleasant 74 yo African American female withanemia of chronic renal insufficiency and erythropoietin deficiency. She received Retacrit today for Hgb 10.8.  We will see what her iron studies look like and replace if needed.  We will see her again in another 3 weeks for lab and injection and follow-up in 6 weeks.  She can contact our office with  any questions.   Laverna Peace, NP 7/27/20212:47 PM

## 2019-10-15 NOTE — Patient Instructions (Signed)

## 2019-10-16 LAB — FERRITIN: Ferritin: 568 ng/mL — ABNORMAL HIGH (ref 11–307)

## 2019-10-16 LAB — IRON AND TIBC
Iron: 152 ug/dL — ABNORMAL HIGH (ref 41–142)
Saturation Ratios: 42 % (ref 21–57)
TIBC: 365 ug/dL (ref 236–444)
UIBC: 213 ug/dL (ref 120–384)

## 2019-11-04 ENCOUNTER — Other Ambulatory Visit: Payer: Self-pay | Admitting: *Deleted

## 2019-11-04 DIAGNOSIS — D631 Anemia in chronic kidney disease: Secondary | ICD-10-CM

## 2019-11-05 ENCOUNTER — Inpatient Hospital Stay: Payer: Medicare Other

## 2019-11-12 ENCOUNTER — Inpatient Hospital Stay: Payer: Medicare Other | Attending: Family

## 2019-11-12 ENCOUNTER — Other Ambulatory Visit: Payer: Self-pay

## 2019-11-12 ENCOUNTER — Inpatient Hospital Stay: Payer: Medicare Other

## 2019-11-12 DIAGNOSIS — I129 Hypertensive chronic kidney disease with stage 1 through stage 4 chronic kidney disease, or unspecified chronic kidney disease: Secondary | ICD-10-CM | POA: Insufficient documentation

## 2019-11-12 DIAGNOSIS — N189 Chronic kidney disease, unspecified: Secondary | ICD-10-CM | POA: Insufficient documentation

## 2019-11-12 DIAGNOSIS — D631 Anemia in chronic kidney disease: Secondary | ICD-10-CM

## 2019-11-12 LAB — CBC WITH DIFFERENTIAL (CANCER CENTER ONLY)
Abs Immature Granulocytes: 0.02 10*3/uL (ref 0.00–0.07)
Basophils Absolute: 0.1 10*3/uL (ref 0.0–0.1)
Basophils Relative: 1 %
Eosinophils Absolute: 0.2 10*3/uL (ref 0.0–0.5)
Eosinophils Relative: 3 %
HCT: 36.5 % (ref 36.0–46.0)
Hemoglobin: 11.4 g/dL — ABNORMAL LOW (ref 12.0–15.0)
Immature Granulocytes: 0 %
Lymphocytes Relative: 20 %
Lymphs Abs: 1.3 10*3/uL (ref 0.7–4.0)
MCH: 31.7 pg (ref 26.0–34.0)
MCHC: 31.2 g/dL (ref 30.0–36.0)
MCV: 101.4 fL — ABNORMAL HIGH (ref 80.0–100.0)
Monocytes Absolute: 0.4 10*3/uL (ref 0.1–1.0)
Monocytes Relative: 6 %
Neutro Abs: 4.6 10*3/uL (ref 1.7–7.7)
Neutrophils Relative %: 70 %
Platelet Count: 167 10*3/uL (ref 150–400)
RBC: 3.6 MIL/uL — ABNORMAL LOW (ref 3.87–5.11)
RDW: 14 % (ref 11.5–15.5)
WBC Count: 6.5 10*3/uL (ref 4.0–10.5)
nRBC: 0 % (ref 0.0–0.2)

## 2019-11-12 LAB — COMPREHENSIVE METABOLIC PANEL
ALT: 20 U/L (ref 0–44)
AST: 26 U/L (ref 15–41)
Albumin: 4.5 g/dL (ref 3.5–5.0)
Alkaline Phosphatase: 35 U/L — ABNORMAL LOW (ref 38–126)
Anion gap: 9 (ref 5–15)
BUN: 33 mg/dL — ABNORMAL HIGH (ref 8–23)
CO2: 28 mmol/L (ref 22–32)
Calcium: 10.4 mg/dL — ABNORMAL HIGH (ref 8.9–10.3)
Chloride: 106 mmol/L (ref 98–111)
Creatinine, Ser: 1.36 mg/dL — ABNORMAL HIGH (ref 0.44–1.00)
GFR calc Af Amer: 44 mL/min — ABNORMAL LOW (ref >60)
GFR calc non Af Amer: 38 mL/min — ABNORMAL LOW (ref >60)
Glucose, Bld: 139 mg/dL — ABNORMAL HIGH (ref 70–99)
Potassium: 4.4 mmol/L (ref 3.5–5.1)
Sodium: 143 mmol/L (ref 135–145)
Total Bilirubin: 0.6 mg/dL (ref 0.3–1.2)
Total Protein: 7.5 g/dL (ref 6.5–8.1)

## 2019-11-26 ENCOUNTER — Inpatient Hospital Stay: Payer: Medicare Other

## 2019-11-26 ENCOUNTER — Inpatient Hospital Stay: Payer: Medicare Other | Admitting: Family

## 2019-12-03 ENCOUNTER — Telehealth: Payer: Self-pay | Admitting: Family

## 2019-12-03 ENCOUNTER — Inpatient Hospital Stay: Payer: Medicare Other | Attending: Family

## 2019-12-03 ENCOUNTER — Other Ambulatory Visit: Payer: Self-pay

## 2019-12-03 ENCOUNTER — Encounter: Payer: Self-pay | Admitting: Family

## 2019-12-03 ENCOUNTER — Inpatient Hospital Stay: Payer: Medicare Other

## 2019-12-03 ENCOUNTER — Inpatient Hospital Stay (HOSPITAL_BASED_OUTPATIENT_CLINIC_OR_DEPARTMENT_OTHER): Payer: Medicare Other | Admitting: Family

## 2019-12-03 VITALS — BP 110/70 | HR 82 | Temp 98.5°F | Resp 18 | Wt 119.2 lb

## 2019-12-03 DIAGNOSIS — N189 Chronic kidney disease, unspecified: Secondary | ICD-10-CM | POA: Insufficient documentation

## 2019-12-03 DIAGNOSIS — D500 Iron deficiency anemia secondary to blood loss (chronic): Secondary | ICD-10-CM

## 2019-12-03 DIAGNOSIS — D631 Anemia in chronic kidney disease: Secondary | ICD-10-CM

## 2019-12-03 LAB — CBC WITH DIFFERENTIAL (CANCER CENTER ONLY)
Abs Immature Granulocytes: 0.01 10*3/uL (ref 0.00–0.07)
Basophils Absolute: 0.1 10*3/uL (ref 0.0–0.1)
Basophils Relative: 1 %
Eosinophils Absolute: 0.2 10*3/uL (ref 0.0–0.5)
Eosinophils Relative: 4 %
HCT: 30.4 % — ABNORMAL LOW (ref 36.0–46.0)
Hemoglobin: 9.5 g/dL — ABNORMAL LOW (ref 12.0–15.0)
Immature Granulocytes: 0 %
Lymphocytes Relative: 27 %
Lymphs Abs: 1.2 10*3/uL (ref 0.7–4.0)
MCH: 31 pg (ref 26.0–34.0)
MCHC: 31.3 g/dL (ref 30.0–36.0)
MCV: 99.3 fL (ref 80.0–100.0)
Monocytes Absolute: 0.4 10*3/uL (ref 0.1–1.0)
Monocytes Relative: 9 %
Neutro Abs: 2.6 10*3/uL (ref 1.7–7.7)
Neutrophils Relative %: 59 %
Platelet Count: 165 10*3/uL (ref 150–400)
RBC: 3.06 MIL/uL — ABNORMAL LOW (ref 3.87–5.11)
RDW: 14 % (ref 11.5–15.5)
WBC Count: 4.5 10*3/uL (ref 4.0–10.5)
nRBC: 0 % (ref 0.0–0.2)

## 2019-12-03 LAB — CMP (CANCER CENTER ONLY)
ALT: 17 U/L (ref 0–44)
AST: 23 U/L (ref 15–41)
Albumin: 4 g/dL (ref 3.5–5.0)
Alkaline Phosphatase: 26 U/L — ABNORMAL LOW (ref 38–126)
Anion gap: 7 (ref 5–15)
BUN: 22 mg/dL (ref 8–23)
CO2: 29 mmol/L (ref 22–32)
Calcium: 10.1 mg/dL (ref 8.9–10.3)
Chloride: 107 mmol/L (ref 98–111)
Creatinine: 1.25 mg/dL — ABNORMAL HIGH (ref 0.44–1.00)
GFR, Est AFR Am: 49 mL/min — ABNORMAL LOW (ref >60)
GFR, Estimated: 42 mL/min — ABNORMAL LOW (ref >60)
Glucose, Bld: 96 mg/dL (ref 70–99)
Potassium: 4.7 mmol/L (ref 3.5–5.1)
Sodium: 143 mmol/L (ref 135–145)
Total Bilirubin: 0.7 mg/dL (ref 0.3–1.2)
Total Protein: 6.7 g/dL (ref 6.5–8.1)

## 2019-12-03 LAB — FERRITIN: Ferritin: 617 ng/mL — ABNORMAL HIGH (ref 11–307)

## 2019-12-03 LAB — IRON AND TIBC
Iron: 111 ug/dL (ref 41–142)
Saturation Ratios: 35 % (ref 21–57)
TIBC: 320 ug/dL (ref 236–444)
UIBC: 209 ug/dL (ref 120–384)

## 2019-12-03 MED ORDER — EPOETIN ALFA-EPBX 40000 UNIT/ML IJ SOLN
40000.0000 [IU] | Freq: Once | INTRAMUSCULAR | Status: AC
  Administered 2019-12-03: 40000 [IU] via SUBCUTANEOUS

## 2019-12-03 NOTE — Patient Instructions (Signed)

## 2019-12-03 NOTE — Telephone Encounter (Signed)
Appointments were scheduled per 9/14 los

## 2019-12-03 NOTE — Progress Notes (Signed)
Hematology and Oncology Follow Up Visit  Rachael Davenport [id number] 1945-05-19 74 y.o. 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 is here today for follow-up. She is feeling fatigued. Hgb today is 9.5, MCV 99, WBC count 4.5 and platelets 165.  She has not noted any blood loss. No abnormal bruising, no petechiae.  She states that she recently had an endoscopy and colonoscopy with 1 poly removed and a hiatal hernia.  No fever, chills, n/v, cough, rash, dizziness, SOB, chest pain, palpitations, abdominal pain or changes in bowel or bladder habits.  No swelling, tenderness in her extremities at this time. She will occasionally have puffiness in her ankles. She has numbness and tingling in the right hand due to chronic neck issues.  No falls or syncopal episodes to report.  She has maintained a good appetite and is staying well hydrated. Her weight is stable.   ECOG Performance Status: 1 - Symptomatic but completely ambulatory  Medications:  Allergies as of 12/03/2019      Reactions   Penicillins Itching, Swelling   Has patient had a PCN reaction causing immediate rash, facial/tongue/throat swelling, SOB or lightheadedness with hypotension: Yes Has patient had a PCN reaction causing severe rash involving mucus membranes or skin necrosis: No Has patient had a PCN reaction that required hospitalization: No Has patient had a PCN reaction occurring within the last 10 years: No If all of the above answers are "NO", then may proceed with Cephalosporin use.   Valsartan Other (See Comments), Itching   Heart sped up      Medication List       Accurate as of December 03, 2019  9:59 AM. If you have any questions, ask your nurse or doctor.        aspirin EC 81 MG tablet Take 81 mg by mouth daily.   Creon 12000-38000  units Cpep capsule Generic drug: lipase/protease/amylase Creon 12,000-38,000-60,000 unit capsule,delayed release   Fenofibric Acid 135 MG Cpdr fenofibric acid (choline) 135 mg capsule,delayed release   fluticasone 50 MCG/ACT nasal spray Commonly known as: FLONASE U 1 SPRAY IEN BID   Imvexxy Maintenance Pack 4 MCG Inst Generic drug: Estradiol Imvexxy Maintenance Pack 4 mcg vaginal insert  Insert 1 vaginal insert twice a week by vaginal route.   ipratropium 0.06 % nasal spray Commonly known as: ATROVENT Place into the nose.   olmesartan 20 MG tablet Commonly known as: BENICAR Take 20 mg by mouth daily.   Restasis 0.05 % ophthalmic emulsion Generic drug: cycloSPORINE INT 1 GTT IN OU BID UTD   SUMAtriptan 25 MG tablet Commonly known as: IMITREX Take 25 mg by mouth once as needed for migraine (MAY REPEAT ONCE IN 2 HOURS IF NO RELIEF (MAX 2 TABLETS/24 HOURS)).   Vitamin D (Ergocalciferol) 1.25 MG (50000 UNIT) Caps capsule Commonly known as: DRISDOL Take 50,000 Units by mouth every 7 (seven) days.   ergocalciferol 1.25 MG (50000 UT) capsule Commonly known as: VITAMIN D2 Take 50,000 Units by mouth once a week.       Allergies:  Allergies  Allergen Reactions  . Penicillins Itching and Swelling    Has patient had a PCN reaction causing immediate rash, facial/tongue/throat swelling, SOB or lightheadedness with hypotension: Yes Has patient had a PCN reaction causing severe rash involving mucus membranes or skin necrosis: No Has patient had a PCN reaction that  required hospitalization: No Has patient had a PCN reaction occurring within the last 10 years: No If all of the above answers are "NO", then may proceed with Cephalosporin use.   . Valsartan Other (See Comments) and Itching    Heart sped up    Past Medical History, Surgical history, Social history, and Family History were reviewed and updated.  Review of Systems: All other 10 point review of systems is negative.     Physical Exam:  weight is 119 lb 4 oz (54.1 kg). Her oral temperature is 98.5 F (36.9 C). Her blood pressure is 110/70 and her pulse is 82. Her respiration is 18 and oxygen saturation is 100%.   Wt Readings from Last 3 Encounters:  12/03/19 119 lb 4 oz (54.1 kg)  10/15/19 118 lb 12.8 oz (53.9 kg)  10/15/19 118 lb 1.3 oz (53.6 kg)    Ocular: Sclerae unicteric, pupils equal, round and reactive to light Ear-nose-throat: Oropharynx clear, dentition fair Lymphatic: No cervical or supraclavicular adenopathy Lungs no rales or rhonchi, good excursion bilaterally Heart regular rate and rhythm, no murmur appreciated Abd soft, nontender, positive bowel sounds MSK no focal spinal tenderness, no joint edema Neuro: non-focal, well-oriented, appropriate affect Breasts: Deferred   Lab Results  Component Value Date   WBC 4.5 12/03/2019   HGB 9.5 (L) 12/03/2019   HCT 30.4 (L) 12/03/2019   MCV 99.3 12/03/2019   PLT 165 12/03/2019   Lab Results  Component Value Date   FERRITIN 568 (H) 10/15/2019   IRON 152 (H) 10/15/2019   TIBC 365 10/15/2019   UIBC 213 10/15/2019   IRONPCTSAT 42 10/15/2019   Lab Results  Component Value Date   RETICCTPCT 0.6 09/02/2019   RBC 3.06 (L) 12/03/2019   No results found for: KPAFRELGTCHN, LAMBDASER, KAPLAMBRATIO No results found for: IGGSERUM, IGA, IGMSERUM No results found for: Kathrynn Ducking, MSPIKE, SPEI   Chemistry      Component Value Date/Time   NA 143 11/12/2019 1020   NA 145 02/13/2017 1046   NA 142 12/23/2016 0951   K 4.4 11/12/2019 1020   K 4.6 02/13/2017 1046   K 3.9 12/23/2016 0951   CL 106 11/12/2019 1020   CL 108 02/13/2017 1046   CO2 28 11/12/2019 1020   CO2 28 02/13/2017 1046   CO2 25 12/23/2016 0951   BUN 33 (H) 11/12/2019 1020   BUN 22 02/13/2017 1046   BUN 15.0 12/23/2016 0951   CREATININE 1.36 (H) 11/12/2019 1020   CREATININE 1.23 (H) 10/15/2019 1348   CREATININE 1.1 02/13/2017  1046   CREATININE 0.9 12/23/2016 0951      Component Value Date/Time   CALCIUM 10.4 (H) 11/12/2019 1020   CALCIUM 9.7 02/13/2017 1046   CALCIUM 9.5 12/23/2016 0951   ALKPHOS 35 (L) 11/12/2019 1020   ALKPHOS 60 02/13/2017 1046   ALKPHOS 69 12/23/2016 0951   AST 26 11/12/2019 1020   AST 28 10/15/2019 1348   AST 26 12/23/2016 0951   ALT 20 11/12/2019 1020   ALT 20 10/15/2019 1348   ALT 18 02/13/2017 1046   ALT 16 12/23/2016 0951   BILITOT 0.6 11/12/2019 1020   BILITOT 0.8 10/15/2019 1348   BILITOT 0.96 12/23/2016 0951       Impression and Plan: Ms. Brissette is a very pleasant 74 yo African American female withanemia of chronic renal insufficiency and erythropoietin deficiency. She received Retacrit today for Hgb 9.5.  We will se what her iron looks  like and replace if needed.  We will see her again in 3 weeks for lab and injection and follow-up in 6 weeks.  She can contact our office with any questions or concerns.   Laverna Peace, NP 9/14/20219:59 AM

## 2020-01-05 ENCOUNTER — Emergency Department (HOSPITAL_BASED_OUTPATIENT_CLINIC_OR_DEPARTMENT_OTHER)
Admission: EM | Admit: 2020-01-05 | Discharge: 2020-01-05 | Disposition: A | Discharge status: Home / Self Care | Payer: Medicare Other | Attending: Emergency Medicine | Admitting: Emergency Medicine

## 2020-01-05 ENCOUNTER — Emergency Department (HOSPITAL_BASED_OUTPATIENT_CLINIC_OR_DEPARTMENT_OTHER): Payer: Medicare Other

## 2020-01-05 ENCOUNTER — Other Ambulatory Visit: Payer: Self-pay

## 2020-01-05 ENCOUNTER — Encounter (HOSPITAL_BASED_OUTPATIENT_CLINIC_OR_DEPARTMENT_OTHER): Payer: Self-pay | Admitting: *Deleted

## 2020-01-05 DIAGNOSIS — S76011A Strain of muscle, fascia and tendon of right hip, initial encounter: Secondary | ICD-10-CM | POA: Insufficient documentation

## 2020-01-05 DIAGNOSIS — W010XXA Fall on same level from slipping, tripping and stumbling without subsequent striking against object, initial encounter: Secondary | ICD-10-CM | POA: Insufficient documentation

## 2020-01-05 DIAGNOSIS — I10 Essential (primary) hypertension: Secondary | ICD-10-CM | POA: Diagnosis not present

## 2020-01-05 DIAGNOSIS — Y9301 Activity, walking, marching and hiking: Secondary | ICD-10-CM | POA: Insufficient documentation

## 2020-01-05 DIAGNOSIS — T148XXA Other injury of unspecified body region, initial encounter: Secondary | ICD-10-CM

## 2020-01-05 DIAGNOSIS — Z7982 Long term (current) use of aspirin: Secondary | ICD-10-CM | POA: Insufficient documentation

## 2020-01-05 DIAGNOSIS — E119 Type 2 diabetes mellitus without complications: Secondary | ICD-10-CM | POA: Insufficient documentation

## 2020-01-05 DIAGNOSIS — S79911A Unspecified injury of right hip, initial encounter: Secondary | ICD-10-CM | POA: Diagnosis present

## 2020-01-05 NOTE — ED Provider Notes (Signed)
[facility] Provider Note   CSN: [id number] Arrival date & time: 01/05/20  1504     History Chief Complaint  Patient presents with  . Fall    Rachael Davenport is a 74 y.o. female with a past medical history of hypertension, diabetes presenting to the ED for right hip pain.  2 days ago she was walking up the bleachers when she felt like her foot got caught in the middle and she fell.  Denies any head injury, loss of consciousness.  She was able to get up and she has been ambulatory since then.  However last night she started experiencing worsening pain in the muscles of bilateral legs.  Reports some pain in her right hip as well.  Denies any headache, vision changes, shortness of breath, loss of bowel or bladder function or back pain.  HPI     Past Medical History:  Diagnosis Date  . Diabetes mellitus without complication ([place])   . Hypertension   . Iron deficiency anemia due to chronic blood loss 07/09/2018    Patient Active Problem List   Diagnosis Date Noted  . Iron deficiency anemia due to chronic blood loss 07/09/2018  . 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 BUNIONECTOMY       OB History   No obstetric history on file.     No family history on file.  Social History   Tobacco Use  . Smoking status: Never Smoker  . Smokeless tobacco: Never Used  Vaping Use  . Vaping Use: Never used  Substance Use Topics  . Alcohol use: Never  . Drug use: Never    Home Medications Prior to Admission medications   Medication Sig Start Date End Date Taking? Authorizing Provider  aspirin EC 81 MG tablet Take 81 mg by mouth daily.    [provider]  Choline Fenofibrate (FENOFIBRIC ACID) 135 MG CPDR fenofibric acid (choline) 135 mg capsule,delayed release 05/09/17   [provider]  ergocalciferol (VITAMIN D2) 50000 units capsule Take 50,000 Units by mouth once a  week. 05/12/15   [provider]  Estradiol (IMVEXXY MAINTENANCE PACK) 4 MCG INST Imvexxy Maintenance Pack 4 mcg vaginal insert  Insert 1 vaginal insert twice a week by vaginal route.    [provider]  fluticasone (FLONASE) 50 MCG/ACT nasal spray U 1 SPRAY IEN BID 04/30/17   [provider]  ipratropium (ATROVENT) 0.06 % nasal spray Place into the nose. 05/06/19 06/05/19  [provider]  lipase/protease/amylase (CREON) 12000 units CPEP capsule Creon 12,000-38,000-60,000 unit capsule,delayed release Patient not taking: Reported on 10/15/2019    [provider]  olmesartan (BENICAR) 20 MG tablet Take 20 mg by mouth daily. 05/12/15   [provider]  RESTASIS 0.05 % ophthalmic emulsion INT 1 GTT IN OU BID UTD 04/05/17   [provider]  SUMAtriptan (IMITREX) 25 MG tablet Take 25 mg by mouth once as needed for migraine (MAY REPEAT ONCE IN 2 HOURS IF NO RELIEF (MAX 2 TABLETS/24 HOURS)).  06/08/15   [provider]  Vitamin D, Ergocalciferol, (DRISDOL) 50000 units CAPS capsule Take 50,000 Units by mouth every 7 (seven) days. 05/12/15   [provider]    Allergies    Penicillins and Valsartan  Review of Systems   Review of Systems  Constitutional: Negative for chills and fever.  Musculoskeletal: Positive for arthralgias. Negative for myalgias.  Neurological: Negative for weakness, numbness  and headaches.    Physical Exam Updated Vital Signs BP (!) 128/48 (BP Location: Left Arm)   Pulse 72   Temp 98.2 F (36.8 C) (Oral)   Resp 18   Ht 5\' 2"  (1.575 m)   Wt 52.6 kg   SpO2 100%   BMI 21.22 kg/m   Physical Exam Vitals and nursing note reviewed.  Constitutional:      General: She is not in acute distress.    Appearance: She is well-developed. She is not diaphoretic.  HENT:     Head: Normocephalic and atraumatic.  Eyes:     General: No scleral icterus.    Conjunctiva/sclera: Conjunctivae normal.    Cardiovascular:     Rate and Rhythm: Normal rate and regular rhythm.     Heart sounds: Normal heart sounds.  Pulmonary:     Effort: Pulmonary effort is normal. No respiratory distress.  Musculoskeletal:        General: Tenderness present.     Cervical back: Normal range of motion.     Comments: Some tenderness of the right hip without changes to range of motion.  No deformities.  Strength 5/5 in bilateral upper and lower extremities.  Normal sensation to light touch of bilateral upper and lower extremities.  Skin:    Findings: No rash.  Neurological:     Mental Status: She is alert.     ED Results / Procedures / Treatments   Labs (all labs ordered are listed, but only abnormal results are displayed) Labs Reviewed - No data to display  EKG None  Radiology DG Hip Unilat W or Wo Pelvis 2-3 Views Right  Result Date: 01/05/2020 CLINICAL DATA:  Fall, right hip pain EXAM: DG HIP (WITH OR WITHOUT PELVIS) 2-3V RIGHT COMPARISON:  None. FINDINGS: Hip joints and SI joints symmetric. Early degenerative changes in the hip joints. No acute bony abnormality. Specifically, no fracture, subluxation, or dislocation. IMPRESSION: No acute bony abnormality. Electronically Signed   By: Rolm Baptise M.D.   On: 01/05/2020 19:10    Procedures Procedures (including critical care time)  Medications Ordered in ED Medications - No data to display  ED Course  I have reviewed the triage vital signs and the nursing notes.  Pertinent labs & imaging results that were available during my care of the patient were reviewed by me and considered in my medical decision making (see chart for details).    MDM Rules/Calculators/A&P                          74 year old female with past medical history of hypertension, diabetes presenting to the ED for right hip pain.  2 days ago was walking up the bleachers when she felt like her foot got caught in the middle and she fell.  Denies any head injury or loss of  consciousness.  She has been ambulatory since then.  Has been having pain in the muscles of bilateral lower extremities and right hip.  On exam there is muscular tenderness without joint tenderness of bilateral knees or ankles.  She has normal range of motion of the right hip without deformities.  Normal sensation and strength of bilateral lower extremities.  X-ray of the hip is negative.  Suspect muscle strain.  Will treat with Tylenol, warm compresses and PCP follow-up.  Doubt infectious or vascular cause of symptoms.   Patient is hemodynamically stable, in NAD, and able to ambulate in the ED. Evaluation does not show pathology  that would require ongoing emergent intervention or inpatient treatment. I explained the diagnosis to the patient. Pain has been managed and has no complaints prior to discharge. Patient is comfortable with above plan and is stable for discharge at this time. All questions were answered prior to disposition. Strict return precautions for returning to the ED were discussed. Encouraged follow up with PCP.   An After Visit Summary was printed and given to the patient.   Portions of this note were generated with [employer]. Dictation errors may occur despite best attempts at proofreading.  Final Clinical Impression(s) / ED Diagnoses Final diagnoses:  Muscle strain    Rx / DC Orders ED Discharge Orders    None       Delia Heady, PA-C 01/05/20 2039    Wyvonnia Dusky, MD 01/06/20 1144

## 2020-01-05 NOTE — Discharge Instructions (Signed)
Take Tylenol as needed to help with pain. Follow-up with your primary care provider. Return to the ER if you start to experience additional injuries, pain, chest pain, back pain, numbness in arms or legs.

## 2020-01-05 NOTE — ED Notes (Signed)
EDP at bedside  

## 2020-01-05 NOTE — ED Triage Notes (Signed)
Pt reports she tripped and fell walking up bleachers 2 days ago. Reports she felt fine yesterday but today both legs are hurting. Pt ambulated to triage room with quick steady gait

## 2020-01-14 ENCOUNTER — Telehealth: Payer: Self-pay | Admitting: Family

## 2020-01-14 ENCOUNTER — Inpatient Hospital Stay: Payer: Medicare Other

## 2020-01-14 ENCOUNTER — Other Ambulatory Visit: Payer: Self-pay

## 2020-01-14 ENCOUNTER — Inpatient Hospital Stay: Payer: Medicare Other | Attending: Family | Admitting: Family

## 2020-01-14 VITALS — BP 100/51 | HR 81 | Temp 98.1°F | Resp 17

## 2020-01-14 DIAGNOSIS — Z79899 Other long term (current) drug therapy: Secondary | ICD-10-CM | POA: Diagnosis not present

## 2020-01-14 DIAGNOSIS — N189 Chronic kidney disease, unspecified: Secondary | ICD-10-CM | POA: Diagnosis present

## 2020-01-14 DIAGNOSIS — D631 Anemia in chronic kidney disease: Secondary | ICD-10-CM

## 2020-01-14 DIAGNOSIS — D500 Iron deficiency anemia secondary to blood loss (chronic): Secondary | ICD-10-CM

## 2020-01-14 DIAGNOSIS — R5383 Other fatigue: Secondary | ICD-10-CM | POA: Diagnosis not present

## 2020-01-14 DIAGNOSIS — Z880 Allergy status to penicillin: Secondary | ICD-10-CM | POA: Diagnosis not present

## 2020-01-14 DIAGNOSIS — W010XXA Fall on same level from slipping, tripping and stumbling without subsequent striking against object, initial encounter: Secondary | ICD-10-CM | POA: Insufficient documentation

## 2020-01-14 LAB — CBC WITH DIFFERENTIAL (CANCER CENTER ONLY)
Abs Immature Granulocytes: 0.02 10*3/uL (ref 0.00–0.07)
Basophils Absolute: 0.1 10*3/uL (ref 0.0–0.1)
Basophils Relative: 1 %
Eosinophils Absolute: 0.2 10*3/uL (ref 0.0–0.5)
Eosinophils Relative: 3 %
HCT: 30.4 % — ABNORMAL LOW (ref 36.0–46.0)
Hemoglobin: 9.4 g/dL — ABNORMAL LOW (ref 12.0–15.0)
Immature Granulocytes: 0 %
Lymphocytes Relative: 26 %
Lymphs Abs: 1.4 10*3/uL (ref 0.7–4.0)
MCH: 31.4 pg (ref 26.0–34.0)
MCHC: 30.9 g/dL (ref 30.0–36.0)
MCV: 101.7 fL — ABNORMAL HIGH (ref 80.0–100.0)
Monocytes Absolute: 0.5 10*3/uL (ref 0.1–1.0)
Monocytes Relative: 10 %
Neutro Abs: 3.2 10*3/uL (ref 1.7–7.7)
Neutrophils Relative %: 60 %
Platelet Count: 190 10*3/uL (ref 150–400)
RBC: 2.99 MIL/uL — ABNORMAL LOW (ref 3.87–5.11)
RDW: 14.2 % (ref 11.5–15.5)
WBC Count: 5.3 10*3/uL (ref 4.0–10.5)
nRBC: 0 % (ref 0.0–0.2)

## 2020-01-14 LAB — CMP (CANCER CENTER ONLY)
ALT: 20 U/L (ref 0–44)
AST: 27 U/L (ref 15–41)
Albumin: 4.4 g/dL (ref 3.5–5.0)
Alkaline Phosphatase: 27 U/L — ABNORMAL LOW (ref 38–126)
Anion gap: 6 (ref 5–15)
BUN: 48 mg/dL — ABNORMAL HIGH (ref 8–23)
CO2: 25 mmol/L (ref 22–32)
Calcium: 10.4 mg/dL — ABNORMAL HIGH (ref 8.9–10.3)
Chloride: 107 mmol/L (ref 98–111)
Creatinine: 1.85 mg/dL — ABNORMAL HIGH (ref 0.44–1.00)
GFR, Estimated: 28 mL/min — ABNORMAL LOW (ref >60)
Glucose, Bld: 117 mg/dL — ABNORMAL HIGH (ref 70–99)
Potassium: 5.3 mmol/L — ABNORMAL HIGH (ref 3.5–5.1)
Sodium: 138 mmol/L (ref 135–145)
Total Bilirubin: 0.7 mg/dL (ref 0.3–1.2)
Total Protein: 7.7 g/dL (ref 6.5–8.1)

## 2020-01-14 MED ORDER — EPOETIN ALFA-EPBX 40000 UNIT/ML IJ SOLN
INTRAMUSCULAR | Status: AC
  Filled 2020-01-14: qty 1

## 2020-01-14 MED ORDER — EPOETIN ALFA-EPBX 40000 UNIT/ML IJ SOLN
40000.0000 [IU] | Freq: Once | INTRAMUSCULAR | Status: AC
  Administered 2020-01-14: 40000 [IU] via SUBCUTANEOUS

## 2020-01-14 NOTE — Patient Instructions (Signed)

## 2020-01-14 NOTE — Progress Notes (Signed)
Hematology and Oncology Follow Up Visit  Rachael Davenport [id number] [birth year] 74 y.o. 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 She is feeling fatigued and states that this may be due to drinking regular Boost lately and not the one with extra protein.  She did have to cancel her 3 week lab and injection appointment earlier this month.  Hgb is 9.4, MCV  101.  She has not noted any blood loss. No bruising or petechiae.  No fever, chills, n/v, cough, rash, dizziness, SOB, chest pain, palpitations, abdominal pain or changes in bowel or bladder habits.   She has mild puffiness in her ankle that comes and goes.  She has numbness and tingling in her fingertips and states that she spoken to her PCP about this and requested further work up.  She tripped and fell while walking up some bleachers at her granddaughter's homecoming but thankfully was not injured.   ECOG Performance Status: 1 - Symptomatic but completely ambulatory  Medications:  Allergies as of 01/14/2020      Reactions   Penicillins Itching, Swelling   Has patient had a PCN reaction causing immediate rash, facial/tongue/throat swelling, SOB or lightheadedness with hypotension: Yes Has patient had a PCN reaction causing severe rash involving mucus membranes or skin necrosis: No Has patient had a PCN reaction that required hospitalization: No Has patient had a PCN reaction occurring within the last 10 years: No If all of the above answers are "NO", then may proceed with Cephalosporin use.   Valsartan Other (See Comments), Itching   Heart sped up      Medication List       Accurate as of January 14, 2020 11:24 AM. If you have any questions, ask your nurse or doctor.        aspirin EC 81 MG tablet Take 81 mg by mouth  daily.   Creon 12000-38000 units Cpep capsule Generic drug: lipase/protease/amylase Creon 12,000-38,000-60,000 unit capsule,delayed release   Fenofibric Acid 135 MG Cpdr fenofibric acid (choline) 135 mg capsule,delayed release   fluticasone 50 MCG/ACT nasal spray Commonly known as: FLONASE U 1 SPRAY IEN BID   Imvexxy Maintenance Pack 4 MCG Inst Generic drug: Estradiol Imvexxy Maintenance Pack 4 mcg vaginal insert  Insert 1 vaginal insert twice a week by vaginal route.   ipratropium 0.06 % nasal spray Commonly known as: ATROVENT Place into the nose.   olmesartan 20 MG tablet Commonly known as: BENICAR Take 20 mg by mouth daily.   Restasis 0.05 % ophthalmic emulsion Generic drug: cycloSPORINE INT 1 GTT IN OU BID UTD   SUMAtriptan 25 MG tablet Commonly known as: IMITREX Take 25 mg by mouth once as needed for migraine (MAY REPEAT ONCE IN 2 HOURS IF NO RELIEF (MAX 2 TABLETS/24 HOURS)).   Vitamin D (Ergocalciferol) 1.25 MG (50000 UNIT) Caps capsule Commonly known as: DRISDOL Take 50,000 Units by mouth every 7 (seven) days.   ergocalciferol 1.25 MG (50000 UT) capsule Commonly known as: VITAMIN D2 Take 50,000 Units by mouth once a week.       Allergies:  Allergies  Allergen Reactions  . Penicillins Itching and Swelling    Has patient had a PCN reaction causing immediate rash, facial/tongue/throat swelling, SOB or lightheadedness with hypotension: Yes Has patient had a PCN reaction causing severe rash involving mucus membranes or skin  necrosis: No Has patient had a PCN reaction that required hospitalization: No Has patient had a PCN reaction occurring within the last 10 years: No If all of the above answers are "NO", then may proceed with Cephalosporin use.   . Valsartan Other (See Comments) and Itching    Heart sped up    Past Medical History, Surgical history, Social history, and Family History were reviewed and updated.  Review of Systems: All other 10 point  review of systems is negative.   Physical Exam:  vitals were not taken for this visit.   Wt Readings from Last 3 Encounters:  01/05/20 116 lb (52.6 kg)  12/03/19 119 lb 4 oz (54.1 kg)  10/15/19 118 lb 12.8 oz (53.9 kg)    Ocular: Sclerae unicteric, pupils equal, round and reactive to light Ear-nose-throat: Oropharynx clear, dentition fair Lymphatic: No cervical or supraclavicular adenopathy Lungs no rales or rhonchi, good excursion bilaterally Heart regular rate and rhythm, no murmur appreciated Abd soft, nontender, positive bowel sounds MSK no focal spinal tenderness, no joint edema Neuro: non-focal, well-oriented, appropriate affect Breasts: Deferred   Lab Results  Component Value Date   WBC 5.3 01/14/2020   HGB 9.4 (L) 01/14/2020   HCT 30.4 (L) 01/14/2020   MCV 101.7 (H) 01/14/2020   PLT 190 01/14/2020   Lab Results  Component Value Date   FERRITIN 617 (H) 12/03/2019   IRON 111 12/03/2019   TIBC 320 12/03/2019   UIBC 209 12/03/2019   IRONPCTSAT 35 12/03/2019   Lab Results  Component Value Date   RETICCTPCT 0.6 09/02/2019   RBC 2.99 (L) 01/14/2020   No results found for: KPAFRELGTCHN, LAMBDASER, KAPLAMBRATIO No results found for: IGGSERUM, IGA, IGMSERUM No results found for: Kathrynn Ducking, MSPIKE, SPEI   Chemistry      Component Value Date/Time   NA 138 01/14/2020 1046   NA 145 02/13/2017 1046   NA 142 12/23/2016 0951   K 5.3 (H) 01/14/2020 1046   K 4.6 02/13/2017 1046   K 3.9 12/23/2016 0951   CL 107 01/14/2020 1046   CL 108 02/13/2017 1046   CO2 25 01/14/2020 1046   CO2 28 02/13/2017 1046   CO2 25 12/23/2016 0951   BUN 48 (H) 01/14/2020 1046   BUN 22 02/13/2017 1046   BUN 15.0 12/23/2016 0951   CREATININE 1.85 (H) 01/14/2020 1046   CREATININE 1.1 02/13/2017 1046   CREATININE 0.9 12/23/2016 0951      Component Value Date/Time   CALCIUM 10.4 (H) 01/14/2020 1046   CALCIUM 9.7 02/13/2017 1046   CALCIUM  9.5 12/23/2016 0951   ALKPHOS 27 (L) 01/14/2020 1046   ALKPHOS 60 02/13/2017 1046   ALKPHOS 69 12/23/2016 0951   AST 27 01/14/2020 1046   AST 26 12/23/2016 0951   ALT 20 01/14/2020 1046   ALT 18 02/13/2017 1046   ALT 16 12/23/2016 0951   BILITOT 0.7 01/14/2020 1046   BILITOT 0.96 12/23/2016 0951       Impression and Plan: Rachael Davenport is a very pleasant 74 yo African American female withanemia of chronic renal insufficiency and erythropoietin deficiency. She received Retacrit today for Hgb 9.4.  Iron studies are pending. We will replace if needed.  Follow-up in 6 weeks with lab and injection every 3 weeks.  She was encouraged to contact our office with any questions or concerns.   Laverna Peace, NP 10/26/202111:24 AM

## 2020-01-14 NOTE — Telephone Encounter (Signed)
Appointments scheduled calendar printed per 10/26 los

## 2020-01-15 LAB — IRON AND TIBC
Iron: 135 ug/dL (ref 41–142)
Saturation Ratios: 35 % (ref 21–57)
TIBC: 381 ug/dL (ref 236–444)
UIBC: 246 ug/dL (ref 120–384)

## 2020-01-15 LAB — FERRITIN: Ferritin: 804 ng/mL — ABNORMAL HIGH (ref 11–307)

## 2020-01-27 ENCOUNTER — Other Ambulatory Visit (HOSPITAL_COMMUNITY): Payer: Self-pay | Admitting: Pulmonary Disease

## 2020-01-27 ENCOUNTER — Other Ambulatory Visit: Payer: Self-pay | Admitting: Pulmonary Disease

## 2020-01-27 DIAGNOSIS — Z1231 Encounter for screening mammogram for malignant neoplasm of breast: Secondary | ICD-10-CM

## 2020-01-28 ENCOUNTER — Ambulatory Visit
Admission: RE | Admit: 2020-01-28 | Discharge: 2020-01-28 | Disposition: A | Discharge status: Home / Self Care | Payer: Medicare Other | Source: Ambulatory Visit | Attending: Pulmonary Disease | Admitting: Pulmonary Disease

## 2020-01-28 ENCOUNTER — Telehealth: Payer: Self-pay

## 2020-01-28 ENCOUNTER — Other Ambulatory Visit: Payer: Self-pay

## 2020-01-28 DIAGNOSIS — Z1231 Encounter for screening mammogram for malignant neoplasm of breast: Secondary | ICD-10-CM

## 2020-01-28 NOTE — Telephone Encounter (Signed)
Called [phone], vm is full, calendar mailed to patient with r/s appts from 02/26/20 to 02/28/20.Marland KitchenMarland KitchenAOM

## 2020-02-04 ENCOUNTER — Other Ambulatory Visit: Payer: Self-pay

## 2020-02-04 ENCOUNTER — Inpatient Hospital Stay: Payer: Medicare Other | Attending: Family

## 2020-02-04 ENCOUNTER — Inpatient Hospital Stay: Payer: Medicare Other

## 2020-02-04 VITALS — BP 122/48 | HR 64 | Temp 98.7°F | Resp 18 | Ht 62.0 in | Wt 118.1 lb

## 2020-02-04 DIAGNOSIS — D500 Iron deficiency anemia secondary to blood loss (chronic): Secondary | ICD-10-CM

## 2020-02-04 DIAGNOSIS — D631 Anemia in chronic kidney disease: Secondary | ICD-10-CM | POA: Insufficient documentation

## 2020-02-04 DIAGNOSIS — N189 Chronic kidney disease, unspecified: Secondary | ICD-10-CM | POA: Insufficient documentation

## 2020-02-04 LAB — CMP (CANCER CENTER ONLY)
ALT: 19 U/L (ref 0–44)
AST: 29 U/L (ref 15–41)
Albumin: 4.5 g/dL (ref 3.5–5.0)
Alkaline Phosphatase: 29 U/L — ABNORMAL LOW (ref 38–126)
Anion gap: 7 (ref 5–15)
BUN: 42 mg/dL — ABNORMAL HIGH (ref 8–23)
CO2: 27 mmol/L (ref 22–32)
Calcium: 10.7 mg/dL — ABNORMAL HIGH (ref 8.9–10.3)
Chloride: 107 mmol/L (ref 98–111)
Creatinine: 1.54 mg/dL — ABNORMAL HIGH (ref 0.44–1.00)
GFR, Estimated: 35 mL/min — ABNORMAL LOW (ref >60)
Glucose, Bld: 130 mg/dL — ABNORMAL HIGH (ref 70–99)
Potassium: 5.6 mmol/L — ABNORMAL HIGH (ref 3.5–5.1)
Sodium: 141 mmol/L (ref 135–145)
Total Bilirubin: 0.6 mg/dL (ref 0.3–1.2)
Total Protein: 7.4 g/dL (ref 6.5–8.1)

## 2020-02-04 LAB — CBC WITH DIFFERENTIAL (CANCER CENTER ONLY)
Abs Immature Granulocytes: 0.01 10*3/uL (ref 0.00–0.07)
Basophils Absolute: 0 10*3/uL (ref 0.0–0.1)
Basophils Relative: 1 %
Eosinophils Absolute: 0.1 10*3/uL (ref 0.0–0.5)
Eosinophils Relative: 3 %
HCT: 33.2 % — ABNORMAL LOW (ref 36.0–46.0)
Hemoglobin: 10.1 g/dL — ABNORMAL LOW (ref 12.0–15.0)
Immature Granulocytes: 0 %
Lymphocytes Relative: 24 %
Lymphs Abs: 0.9 10*3/uL (ref 0.7–4.0)
MCH: 32 pg (ref 26.0–34.0)
MCHC: 30.4 g/dL (ref 30.0–36.0)
MCV: 105.1 fL — ABNORMAL HIGH (ref 80.0–100.0)
Monocytes Absolute: 0.3 10*3/uL (ref 0.1–1.0)
Monocytes Relative: 8 %
Neutro Abs: 2.4 10*3/uL (ref 1.7–7.7)
Neutrophils Relative %: 64 %
Platelet Count: 186 10*3/uL (ref 150–400)
RBC: 3.16 MIL/uL — ABNORMAL LOW (ref 3.87–5.11)
RDW: 14.5 % (ref 11.5–15.5)
WBC Count: 3.7 10*3/uL — ABNORMAL LOW (ref 4.0–10.5)
nRBC: 0 % (ref 0.0–0.2)

## 2020-02-04 MED ORDER — EPOETIN ALFA-EPBX 40000 UNIT/ML IJ SOLN
40000.0000 [IU] | Freq: Once | INTRAMUSCULAR | Status: AC
  Administered 2020-02-04: 40000 [IU] via SUBCUTANEOUS

## 2020-02-04 MED ORDER — EPOETIN ALFA-EPBX 40000 UNIT/ML IJ SOLN
INTRAMUSCULAR | Status: AC
  Filled 2020-02-04: qty 1

## 2020-02-04 NOTE — Patient Instructions (Signed)

## 2020-02-25 ENCOUNTER — Ambulatory Visit: Payer: Medicare Other | Admitting: Family

## 2020-02-25 ENCOUNTER — Ambulatory Visit: Payer: Medicare Other

## 2020-02-25 ENCOUNTER — Other Ambulatory Visit: Payer: Medicare Other

## 2020-03-02 ENCOUNTER — Inpatient Hospital Stay (HOSPITAL_BASED_OUTPATIENT_CLINIC_OR_DEPARTMENT_OTHER): Payer: Medicare Other | Admitting: Family

## 2020-03-02 ENCOUNTER — Telehealth: Payer: Self-pay | Admitting: Family

## 2020-03-02 ENCOUNTER — Encounter: Payer: Self-pay | Admitting: Family

## 2020-03-02 ENCOUNTER — Inpatient Hospital Stay: Payer: Medicare Other

## 2020-03-02 ENCOUNTER — Other Ambulatory Visit: Payer: Self-pay

## 2020-03-02 ENCOUNTER — Inpatient Hospital Stay: Payer: Medicare Other | Attending: Family

## 2020-03-02 VITALS — BP 122/55 | HR 90 | Temp 98.3°F | Resp 18 | Ht 62.0 in | Wt 118.1 lb

## 2020-03-02 DIAGNOSIS — N189 Chronic kidney disease, unspecified: Secondary | ICD-10-CM | POA: Diagnosis present

## 2020-03-02 DIAGNOSIS — D631 Anemia in chronic kidney disease: Secondary | ICD-10-CM | POA: Diagnosis present

## 2020-03-02 DIAGNOSIS — D500 Iron deficiency anemia secondary to blood loss (chronic): Secondary | ICD-10-CM | POA: Diagnosis not present

## 2020-03-02 LAB — CBC WITH DIFFERENTIAL (CANCER CENTER ONLY)
Abs Immature Granulocytes: 0.01 10*3/uL (ref 0.00–0.07)
Basophils Absolute: 0.1 10*3/uL (ref 0.0–0.1)
Basophils Relative: 1 %
Eosinophils Absolute: 0.2 10*3/uL (ref 0.0–0.5)
Eosinophils Relative: 4 %
HCT: 35.5 % — ABNORMAL LOW (ref 36.0–46.0)
Hemoglobin: 10.7 g/dL — ABNORMAL LOW (ref 12.0–15.0)
Immature Granulocytes: 0 %
Lymphocytes Relative: 28 %
Lymphs Abs: 1.3 10*3/uL (ref 0.7–4.0)
MCH: 31.8 pg (ref 26.0–34.0)
MCHC: 30.1 g/dL (ref 30.0–36.0)
MCV: 105.3 fL — ABNORMAL HIGH (ref 80.0–100.0)
Monocytes Absolute: 0.5 10*3/uL (ref 0.1–1.0)
Monocytes Relative: 10 %
Neutro Abs: 2.6 10*3/uL (ref 1.7–7.7)
Neutrophils Relative %: 57 %
Platelet Count: 190 10*3/uL (ref 150–400)
RBC: 3.37 MIL/uL — ABNORMAL LOW (ref 3.87–5.11)
RDW: 13.7 % (ref 11.5–15.5)
WBC Count: 4.6 10*3/uL (ref 4.0–10.5)
nRBC: 0 % (ref 0.0–0.2)

## 2020-03-02 LAB — CMP (CANCER CENTER ONLY)
ALT: 18 U/L (ref 0–44)
AST: 27 U/L (ref 15–41)
Albumin: 4.7 g/dL (ref 3.5–5.0)
Alkaline Phosphatase: 30 U/L — ABNORMAL LOW (ref 38–126)
Anion gap: 9 (ref 5–15)
BUN: 45 mg/dL — ABNORMAL HIGH (ref 8–23)
CO2: 25 mmol/L (ref 22–32)
Calcium: 10.5 mg/dL — ABNORMAL HIGH (ref 8.9–10.3)
Chloride: 110 mmol/L (ref 98–111)
Creatinine: 1.71 mg/dL — ABNORMAL HIGH (ref 0.44–1.00)
GFR, Estimated: 31 mL/min — ABNORMAL LOW (ref >60)
Glucose, Bld: 107 mg/dL — ABNORMAL HIGH (ref 70–99)
Potassium: 6 mmol/L — ABNORMAL HIGH (ref 3.5–5.1)
Sodium: 144 mmol/L (ref 135–145)
Total Bilirubin: 0.6 mg/dL (ref 0.3–1.2)
Total Protein: 7.7 g/dL (ref 6.5–8.1)

## 2020-03-02 MED ORDER — EPOETIN ALFA-EPBX 40000 UNIT/ML IJ SOLN
40000.0000 [IU] | Freq: Once | INTRAMUSCULAR | Status: AC
  Administered 2020-03-02: 12:00:00 40000 [IU] via SUBCUTANEOUS

## 2020-03-02 MED ORDER — EPOETIN ALFA-EPBX 40000 UNIT/ML IJ SOLN
INTRAMUSCULAR | Status: AC
  Filled 2020-03-02: qty 1

## 2020-03-02 NOTE — Patient Instructions (Signed)

## 2020-03-02 NOTE — Telephone Encounter (Signed)
Appointments scheduled calendar printed per 12/13 los 

## 2020-03-02 NOTE — Progress Notes (Signed)
Hematology and Oncology Follow Up Visit  Rachael Davenport [id number] [birth year] 74 y.o. 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 She is doing well but has felt a little fatigued at times.  She has not noted any blood loss. No bruising or petechiae.  Her potassium is 6.0. She is not on any potassium supplements but states that she eats a lot of potatoes. BUN 45 and Creatinine 1.71.  We discussed her going to the ED but she will hold off since she is asymptomatic at this time. She will go if she develops symptoms. I did forward today's labs with note to her PCP.  No fever, chills, n/v, cough, rash, dizziness, SOB, chest pain, palpitations, abdominal pain or changes in bowel or bladder habits.  No swelling or tenderness in her extremities. Pedal pulses are 2+.  She has numbness and tingling in the right fingertips that is unchanged.  No falls or syncope.  She has maintained a good appetite and is staying well hydrated. Her weight is stable.   ECOG Performance Status: 1 - Symptomatic but completely ambulatory  Medications:  Allergies as of 03/02/2020      Reactions   Penicillins Itching, Swelling   Has patient had a PCN reaction causing immediate rash, facial/tongue/throat swelling, SOB or lightheadedness with hypotension: Yes Has patient had a PCN reaction causing severe rash involving mucus membranes or skin necrosis: No Has patient had a PCN reaction that required hospitalization: No Has patient had a PCN reaction occurring within the last 10 years: No If all of the above answers are "NO", then may proceed with Cephalosporin use.   Valsartan Other (See Comments), Itching   Heart sped up      Medication List       Accurate as of March 02, 2020 11:17 AM. If you have any  questions, ask your nurse or doctor.        STOP taking these medications   Creon 12000-38000 units Cpep capsule Generic drug: lipase/protease/amylase Stopped by: Laverna Peace, NP   fluticasone 50 MCG/ACT nasal spray Commonly known as: FLONASE Stopped by: Laverna Peace, NP     TAKE these medications   aspirin EC 81 MG tablet Take 81 mg by mouth daily.   Estradiol 4 MCG Inst Imvexxy Maintenance Pack 4 mcg vaginal insert  Insert 1 vaginal insert twice a week by vaginal route.   Fenofibric Acid 135 MG Cpdr fenofibric acid (choline) 135 mg capsule,delayed release   ipratropium 0.06 % nasal spray Commonly known as: ATROVENT Place into the nose.   olmesartan 20 MG tablet Commonly known as: BENICAR Take 20 mg by mouth daily.   Restasis 0.05 % ophthalmic emulsion Generic drug: cycloSPORINE INT 1 GTT IN OU BID UTD   SUMAtriptan 25 MG tablet Commonly known as: IMITREX Take 25 mg by mouth once as needed for migraine (MAY REPEAT ONCE IN 2 HOURS IF NO RELIEF (MAX 2 TABLETS/24 HOURS)).   Vitamin D (Ergocalciferol) 1.25 MG (50000 UNIT) Caps capsule Commonly known as: DRISDOL Take 50,000 Units by mouth every 7 (seven) days.       Allergies:  Allergies  Allergen Reactions  . Penicillins Itching and Swelling    Has patient had a PCN reaction causing immediate rash, facial/tongue/throat swelling, SOB or lightheadedness with hypotension: Yes Has patient had a PCN reaction causing severe  rash involving mucus membranes or skin necrosis: No Has patient had a PCN reaction that required hospitalization: No Has patient had a PCN reaction occurring within the last 10 years: No If all of the above answers are "NO", then may proceed with Cephalosporin use.   . Valsartan Other (See Comments) and Itching    Heart sped up    Past Medical History, Surgical history, Social history, and Family History were reviewed and updated.  Review of Systems: All other 10 point review of  systems is negative.   Physical Exam:  height is 5\' 2"  (1.575 m) and weight is 118 lb 1.3 oz (53.6 kg). Her oral temperature is 98.3 F (36.8 C). Her blood pressure is 122/55 (abnormal) and her pulse is 90. Her respiration is 18 and oxygen saturation is 100%.   Wt Readings from Last 3 Encounters:  03/02/20 118 lb 1.3 oz (53.6 kg)  02/04/20 118 lb 1.6 oz (53.6 kg)  01/05/20 116 lb (52.6 kg)    Ocular: Sclerae unicteric, pupils equal, round and reactive to light Ear-nose-throat: Oropharynx clear, dentition fair Lymphatic: No cervical or supraclavicular adenopathy Lungs no rales or rhonchi, good excursion bilaterally Heart regular rate and rhythm, no murmur appreciated Abd soft, nontender, positive bowel sounds MSK no focal spinal tenderness, no joint edema Neuro: non-focal, well-oriented, appropriate affect Breasts: Deferred   Lab Results  Component Value Date   WBC 4.6 03/02/2020   HGB 10.7 (L) 03/02/2020   HCT 35.5 (L) 03/02/2020   MCV 105.3 (H) 03/02/2020   PLT 190 03/02/2020   Lab Results  Component Value Date   FERRITIN 804 (H) 01/14/2020   IRON 135 01/14/2020   TIBC 381 01/14/2020   UIBC 246 01/14/2020   IRONPCTSAT 35 01/14/2020   Lab Results  Component Value Date   RETICCTPCT 0.6 09/02/2019   RBC 3.37 (L) 03/02/2020   No results found for: KPAFRELGTCHN, LAMBDASER, KAPLAMBRATIO No results found for: IGGSERUM, IGA, IGMSERUM No results found for: Kathrynn Ducking, MSPIKE, SPEI   Chemistry      Component Value Date/Time   NA 141 02/04/2020 1104   NA 145 02/13/2017 1046   NA 142 12/23/2016 0951   K 5.6 (H) 02/04/2020 1104   K 4.6 02/13/2017 1046   K 3.9 12/23/2016 0951   CL 107 02/04/2020 1104   CL 108 02/13/2017 1046   CO2 27 02/04/2020 1104   CO2 28 02/13/2017 1046   CO2 25 12/23/2016 0951   BUN 42 (H) 02/04/2020 1104   BUN 22 02/13/2017 1046   BUN 15.0 12/23/2016 0951   CREATININE 1.54 (H) 02/04/2020 1104    CREATININE 1.1 02/13/2017 1046   CREATININE 0.9 12/23/2016 0951      Component Value Date/Time   CALCIUM 10.7 (H) 02/04/2020 1104   CALCIUM 9.7 02/13/2017 1046   CALCIUM 9.5 12/23/2016 0951   ALKPHOS 29 (L) 02/04/2020 1104   ALKPHOS 60 02/13/2017 1046   ALKPHOS 69 12/23/2016 0951   AST 29 02/04/2020 1104   AST 26 12/23/2016 0951   ALT 19 02/04/2020 1104   ALT 18 02/13/2017 1046   ALT 16 12/23/2016 0951   BILITOT 0.6 02/04/2020 1104   BILITOT 0.96 12/23/2016 0951       Impression and Plan: Rachael Davenport is a very pleasant 74yo African American female withanemia of chronic renal insufficiency and erythropoietin deficiency. She received Retacrit today for Hgb 10.7.  Iron studies are pending. We will replace if needed.  Follow-up in  6 weeks with lab and injection only in 3 weeks.  She was encouraged to contact our office with any questions or concerns.   Laverna Peace, NP 12/13/202111:17 AM

## 2020-03-02 NOTE — Progress Notes (Signed)
Pt discharged in no apparent distress. Pt left ambulatory without assistance. Pt aware of discharge instructions and verbalized understanding and had no further questions.  

## 2020-03-03 ENCOUNTER — Encounter (HOSPITAL_BASED_OUTPATIENT_CLINIC_OR_DEPARTMENT_OTHER): Payer: Self-pay | Admitting: *Deleted

## 2020-03-03 ENCOUNTER — Other Ambulatory Visit: Payer: Self-pay

## 2020-03-03 ENCOUNTER — Emergency Department (HOSPITAL_BASED_OUTPATIENT_CLINIC_OR_DEPARTMENT_OTHER)
Admission: EM | Admit: 2020-03-03 | Discharge: 2020-03-03 | Disposition: A | Discharge status: Home / Self Care | Payer: Medicare Other | Attending: Emergency Medicine | Admitting: Emergency Medicine

## 2020-03-03 DIAGNOSIS — D649 Anemia, unspecified: Secondary | ICD-10-CM | POA: Insufficient documentation

## 2020-03-03 DIAGNOSIS — E875 Hyperkalemia: Secondary | ICD-10-CM | POA: Diagnosis present

## 2020-03-03 DIAGNOSIS — Z7982 Long term (current) use of aspirin: Secondary | ICD-10-CM | POA: Diagnosis not present

## 2020-03-03 DIAGNOSIS — Z79899 Other long term (current) drug therapy: Secondary | ICD-10-CM | POA: Diagnosis not present

## 2020-03-03 DIAGNOSIS — E119 Type 2 diabetes mellitus without complications: Secondary | ICD-10-CM | POA: Insufficient documentation

## 2020-03-03 DIAGNOSIS — I10 Essential (primary) hypertension: Secondary | ICD-10-CM | POA: Diagnosis not present

## 2020-03-03 LAB — COMPREHENSIVE METABOLIC PANEL
ALT: 23 U/L (ref 0–44)
AST: 34 U/L (ref 15–41)
Albumin: 4.7 g/dL (ref 3.5–5.0)
Alkaline Phosphatase: 33 U/L — ABNORMAL LOW (ref 38–126)
Anion gap: 8 (ref 5–15)
BUN: 41 mg/dL — ABNORMAL HIGH (ref 8–23)
CO2: 24 mmol/L (ref 22–32)
Calcium: 10.1 mg/dL (ref 8.9–10.3)
Chloride: 106 mmol/L (ref 98–111)
Creatinine, Ser: 1.58 mg/dL — ABNORMAL HIGH (ref 0.44–1.00)
GFR, Estimated: 34 mL/min — ABNORMAL LOW (ref >60)
Glucose, Bld: 94 mg/dL (ref 70–99)
Potassium: 5.7 mmol/L — ABNORMAL HIGH (ref 3.5–5.1)
Sodium: 138 mmol/L (ref 135–145)
Total Bilirubin: 0.6 mg/dL (ref 0.3–1.2)
Total Protein: 8.7 g/dL — ABNORMAL HIGH (ref 6.5–8.1)

## 2020-03-03 LAB — CBC WITH DIFFERENTIAL/PLATELET
Abs Immature Granulocytes: 0.01 10*3/uL (ref 0.00–0.07)
Basophils Absolute: 0.1 10*3/uL (ref 0.0–0.1)
Basophils Relative: 1 %
Eosinophils Absolute: 0.1 10*3/uL (ref 0.0–0.5)
Eosinophils Relative: 2 %
HCT: 37 % (ref 36.0–46.0)
Hemoglobin: 11.3 g/dL — ABNORMAL LOW (ref 12.0–15.0)
Immature Granulocytes: 0 %
Lymphocytes Relative: 29 %
Lymphs Abs: 1.6 10*3/uL (ref 0.7–4.0)
MCH: 31.7 pg (ref 26.0–34.0)
MCHC: 30.5 g/dL (ref 30.0–36.0)
MCV: 103.6 fL — ABNORMAL HIGH (ref 80.0–100.0)
Monocytes Absolute: 0.5 10*3/uL (ref 0.1–1.0)
Monocytes Relative: 9 %
Neutro Abs: 3.2 10*3/uL (ref 1.7–7.7)
Neutrophils Relative %: 59 %
Platelets: 214 10*3/uL (ref 150–400)
RBC: 3.57 MIL/uL — ABNORMAL LOW (ref 3.87–5.11)
RDW: 13.5 % (ref 11.5–15.5)
WBC: 5.5 10*3/uL (ref 4.0–10.5)
nRBC: 0 % (ref 0.0–0.2)

## 2020-03-03 LAB — FERRITIN: Ferritin: 652 ng/mL — ABNORMAL HIGH (ref 11–307)

## 2020-03-03 LAB — IRON AND TIBC
Iron: 113 ug/dL (ref 41–142)
Saturation Ratios: 27 % (ref 21–57)
TIBC: 413 ug/dL (ref 236–444)
UIBC: 300 ug/dL (ref 120–384)

## 2020-03-03 NOTE — ED Provider Notes (Signed)
[facility] EMERGENCY DEPARTMENT Provider Note   CSN: [id number] Arrival date & time: 03/03/20  1446     History No chief complaint on file.   Rachael Davenport is a 74 y.o. female.  74 yo F with a chief complaints of high potassium level that was checked at her oncologist office.  Otherwise the patient has been doing well.  She had called her family doctor and assisted him to the ED right away to be evaluated.  She denies any recent medication changes.  Denies feeling lightheaded or dizzy or like she may pass out, also denies actually passing out.  Denies chest pain denies abdominal pain.  Denies lower extremity edema.  Denies shortness of breath.  The history is provided by the patient.  Illness Severity:  Moderate Onset quality:  Gradual Duration:  2 days Timing:  Constant Progression:  Worsening Chronicity:  New Associated symptoms: no chest pain, no congestion, no fever, no headaches, no myalgias, no nausea, no rhinorrhea, no shortness of breath, no vomiting and no wheezing        Past Medical History:  Diagnosis Date  . Diabetes mellitus without complication ([place])   . Hypertension   . Iron deficiency anemia due to chronic blood loss 07/09/2018    Patient Active Problem List   Diagnosis Date Noted  . Iron deficiency anemia due to chronic blood loss 07/09/2018  . 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 BUNIONECTOMY       OB History   No obstetric history on file.     No family history on file.  Social History   Tobacco Use  . Smoking status: Never Smoker  . Smokeless tobacco: Never Used  Vaping Use  . Vaping Use: Never used  Substance Use Topics  . Alcohol use: Never  . Drug use: Never    Home Medications Prior to Admission medications   Medication Sig Start Date End Date Taking? Authorizing Provider  aspirin EC 81 MG tablet Take 81 mg by mouth daily.    [provider]  Choline Fenofibrate (FENOFIBRIC ACID) 135 MG CPDR fenofibric acid (choline) 135 mg capsule,delayed release 05/09/17   [provider]  Estradiol 4 MCG INST Imvexxy Maintenance Pack 4 mcg vaginal insert  Insert 1 vaginal insert twice a week by vaginal route.    [provider]  ipratropium (ATROVENT) 0.06 % nasal spray Place into the nose. 05/06/19 06/05/19  [provider]  olmesartan (BENICAR) 20 MG tablet Take 20 mg by mouth daily. 05/12/15   [provider]  RESTASIS 0.05 % ophthalmic emulsion INT 1 GTT IN OU BID UTD 04/05/17   [provider]  SUMAtriptan (IMITREX) 25 MG tablet Take 25 mg by mouth once as needed for migraine (MAY REPEAT ONCE IN 2 HOURS IF NO RELIEF (MAX 2 TABLETS/24 HOURS)).  06/08/15   [provider]  Vitamin D, Ergocalciferol, (DRISDOL) 50000 units CAPS capsule Take 50,000 Units by mouth every 7 (seven) days. 05/12/15   [provider]    Allergies    Penicillins and Valsartan  Review of Systems   Review of Systems  Constitutional: Negative for chills and fever.  HENT: Negative for congestion and rhinorrhea.   Eyes: Negative for redness and visual disturbance.  Respiratory: Negative for shortness of breath and wheezing.   Cardiovascular: Negative for chest pain and palpitations.  Gastrointestinal: Negative for nausea and vomiting.  Genitourinary: Negative for dysuria and  urgency.  Musculoskeletal: Negative for arthralgias and myalgias.  Skin: Negative for pallor and wound.  Neurological: Negative for dizziness and headaches.    Physical Exam Updated Vital Signs BP (!) 139/59 (BP Location: Left Arm)   Pulse 97   Temp 99.3 F (37.4 C) (Oral)   Resp 18   Ht 5\' 2"  (1.575 m)   Wt 52.6 kg   SpO2 100%   BMI 21.22 kg/m   Physical Exam Vitals and nursing note reviewed.  Constitutional:      General: She is not in acute distress.    Appearance: She is well-developed and well-nourished.  She is not diaphoretic.  HENT:     Head: Normocephalic and atraumatic.  Eyes:     Extraocular Movements: EOM normal.     Pupils: Pupils are equal, round, and reactive to light.  Cardiovascular:     Rate and Rhythm: Normal rate and regular rhythm.     Heart sounds: No murmur heard. No friction rub. No gallop.   Pulmonary:     Effort: Pulmonary effort is normal.     Breath sounds: No wheezing or rales.  Abdominal:     General: There is no distension.     Palpations: Abdomen is soft.     Tenderness: There is no abdominal tenderness.  Musculoskeletal:        General: No tenderness or edema.     Cervical back: Normal range of motion and neck supple.  Skin:    General: Skin is warm and dry.  Neurological:     Mental Status: She is alert and oriented to person, place, and time.  Psychiatric:        Mood and Affect: Mood and affect normal.        Behavior: Behavior normal.     ED Results / Procedures / Treatments   Labs (all labs ordered are listed, but only abnormal results are displayed) Labs Reviewed  CBC WITH DIFFERENTIAL/PLATELET - Abnormal; Notable for the following components:      Result Value   RBC 3.57 (*)    Hemoglobin 11.3 (*)    MCV 103.6 (*)    All other components within normal limits  COMPREHENSIVE METABOLIC PANEL - Abnormal; Notable for the following components:   Potassium 5.7 (*)    BUN 41 (*)    Creatinine, Ser 1.58 (*)    Total Protein 8.7 (*)    Alkaline Phosphatase 33 (*)    GFR, Estimated 34 (*)    All other components within normal limits    EKG EKG Interpretation  Date/Time:  Tuesday March 03 2020 14:57:13 EST Ventricular Rate:  104 PR Interval:  118 QRS Duration: 68 QT Interval:  296 QTC Calculation: 389 R Axis:   62 Text Interpretation: Sinus tachycardia Otherwise normal ECG No old tracing to compare Confirmed by Deno Etienne [phone]) on 03/03/2020 4:06:38 PM   Radiology No results found.  Procedures Procedures (including critical  care time)  Medications Ordered in ED Medications - No data to display  ED Course  I have reviewed the triage vital signs and the nursing notes.  Pertinent labs & imaging results that were available during my care of the patient were reviewed by me and considered in my medical decision making (see chart for details).    MDM Rules/Calculators/A&P                          74 yo F with a chief complaints  of a potassium of 6 that was documented in her oncologist office earlier today.  Patient is asymptomatic.  Was sent by her family doctor's office after she had contacted them.  EKG with sinus tachycardia.  No obvious peaked T waves no widening of the QRS.  Potassium rechecked here is 5.7.  Also had some mild improvement of renal function.  Her hemoglobin is also slightly improved from earlier.  Could be dehydrated.  I discussed this with the patient and offered IV fluids here but she is declining.  She is electing to go home and increase her fluid intake.  I suggested she try to avoid foods that are high in potassium.  Recommended she call her family doctor for a potassium recheck hopefully within the week.  4:36 PM:  I have discussed the diagnosis/risks/treatment options with the patient and believe the pt to be eligible for discharge home to follow-up with PCP. We also discussed returning to the ED immediately if new or worsening sx occur. We discussed the sx which are most concerning (e.g., sudden worsening pain, fever, inability to tolerate by mouth, syncope, near syncope) that necessitate immediate return. Medications administered to the patient during their visit and any new prescriptions provided to the patient are listed below.  Medications given during this visit Medications - No data to display   The patient appears reasonably screen and/or stabilized for discharge and I doubt any other medical condition or other [facility] requiring further screening, evaluation, or treatment in the ED at this  time prior to discharge.   Final Clinical Impression(s) / ED Diagnoses Final diagnoses:  Hyperkalemia  Chronic anemia    Rx / DC Orders ED Discharge Orders    None       Deno Etienne, DO 03/03/20 1636

## 2020-03-03 NOTE — ED Triage Notes (Signed)
Her Potassium was 6.0 and her Hgb was 10.7 at time of her blood draw yesterday. She was advised to come to the ER.

## 2020-03-03 NOTE — Discharge Instructions (Signed)
Try to increase your fluid intake at home.  Please try and avoid foods that are high in potassium.  Call your family doctor to have a recheck hopefully within a week.  Please return to the emergency department if you feel like he may pass out or if you do pass out.

## 2020-03-03 NOTE — ED Notes (Signed)
Pt without symptoms, reports mild fatigue at times which is not new due to her chronic anemia.  Sent by pmd to follow up abnormal labs drawn yesterday, labs drawn in triage on arrival today, slightly improved.

## 2020-03-05 ENCOUNTER — Telehealth: Payer: Self-pay

## 2020-03-05 NOTE — Telephone Encounter (Signed)
Pt had called stating that she went to the ED and was advised that her potassium was high. She feels that she needs to see a nutritionist. She states she called her PCP for them to do a referral but has not heard back from them and request [place] to do this.   Message sent to the desk nurse- per desk nurse pt will need to obtain ref from her PCP, pt called and she is aware..... AOM

## 2020-03-23 ENCOUNTER — Inpatient Hospital Stay: Payer: Medicare Other

## 2020-03-24 ENCOUNTER — Telehealth: Payer: Self-pay

## 2020-03-24 NOTE — Telephone Encounter (Signed)
S/w pt and she is aware of her 03/27/20 appt r/s from 03/23/20/22   aom

## 2020-03-27 ENCOUNTER — Other Ambulatory Visit: Payer: Self-pay

## 2020-03-27 ENCOUNTER — Inpatient Hospital Stay: Payer: Medicare Other

## 2020-03-27 ENCOUNTER — Inpatient Hospital Stay: Payer: Medicare Other | Attending: Family

## 2020-03-27 ENCOUNTER — Other Ambulatory Visit: Payer: Self-pay | Admitting: *Deleted

## 2020-03-27 VITALS — BP 126/54 | HR 84 | Temp 98.9°F | Resp 18

## 2020-03-27 DIAGNOSIS — N189 Chronic kidney disease, unspecified: Secondary | ICD-10-CM | POA: Insufficient documentation

## 2020-03-27 DIAGNOSIS — D500 Iron deficiency anemia secondary to blood loss (chronic): Secondary | ICD-10-CM

## 2020-03-27 DIAGNOSIS — R5383 Other fatigue: Secondary | ICD-10-CM | POA: Diagnosis not present

## 2020-03-27 DIAGNOSIS — R609 Edema, unspecified: Secondary | ICD-10-CM | POA: Insufficient documentation

## 2020-03-27 DIAGNOSIS — Z79899 Other long term (current) drug therapy: Secondary | ICD-10-CM | POA: Diagnosis not present

## 2020-03-27 DIAGNOSIS — D631 Anemia in chronic kidney disease: Secondary | ICD-10-CM

## 2020-03-27 DIAGNOSIS — M7989 Other specified soft tissue disorders: Secondary | ICD-10-CM | POA: Insufficient documentation

## 2020-03-27 DIAGNOSIS — I129 Hypertensive chronic kidney disease with stage 1 through stage 4 chronic kidney disease, or unspecified chronic kidney disease: Secondary | ICD-10-CM | POA: Insufficient documentation

## 2020-03-27 LAB — CBC WITH DIFFERENTIAL (CANCER CENTER ONLY)
Abs Immature Granulocytes: 0.02 10*3/uL (ref 0.00–0.07)
Basophils Absolute: 0 10*3/uL (ref 0.0–0.1)
Basophils Relative: 1 %
Eosinophils Absolute: 0.2 10*3/uL (ref 0.0–0.5)
Eosinophils Relative: 4 %
HCT: 31.8 % — ABNORMAL LOW (ref 36.0–46.0)
Hemoglobin: 9.9 g/dL — ABNORMAL LOW (ref 12.0–15.0)
Immature Granulocytes: 0 %
Lymphocytes Relative: 14 %
Lymphs Abs: 0.7 10*3/uL (ref 0.7–4.0)
MCH: 30.8 pg (ref 26.0–34.0)
MCHC: 31.1 g/dL (ref 30.0–36.0)
MCV: 99.1 fL (ref 80.0–100.0)
Monocytes Absolute: 0.6 10*3/uL (ref 0.1–1.0)
Monocytes Relative: 12 %
Neutro Abs: 3.5 10*3/uL (ref 1.7–7.7)
Neutrophils Relative %: 69 %
Platelet Count: 190 10*3/uL (ref 150–400)
RBC: 3.21 MIL/uL — ABNORMAL LOW (ref 3.87–5.11)
RDW: 13.5 % (ref 11.5–15.5)
WBC Count: 5.1 10*3/uL (ref 4.0–10.5)
nRBC: 0 % (ref 0.0–0.2)

## 2020-03-27 LAB — CMP (CANCER CENTER ONLY)
ALT: 19 U/L (ref 0–44)
AST: 29 U/L (ref 15–41)
Albumin: 4.1 g/dL (ref 3.5–5.0)
Alkaline Phosphatase: 24 U/L — ABNORMAL LOW (ref 38–126)
Anion gap: 8 (ref 5–15)
BUN: 20 mg/dL (ref 8–23)
CO2: 29 mmol/L (ref 22–32)
Calcium: 9.9 mg/dL (ref 8.9–10.3)
Chloride: 107 mmol/L (ref 98–111)
Creatinine: 1.34 mg/dL — ABNORMAL HIGH (ref 0.44–1.00)
GFR, Estimated: 41 mL/min — ABNORMAL LOW (ref >60)
Glucose, Bld: 133 mg/dL — ABNORMAL HIGH (ref 70–99)
Potassium: 3.9 mmol/L (ref 3.5–5.1)
Sodium: 144 mmol/L (ref 135–145)
Total Bilirubin: 1 mg/dL (ref 0.3–1.2)
Total Protein: 7 g/dL (ref 6.5–8.1)

## 2020-03-27 MED ORDER — EPOETIN ALFA-EPBX 40000 UNIT/ML IJ SOLN
40000.0000 [IU] | Freq: Once | INTRAMUSCULAR | Status: AC
  Administered 2020-03-27: 40000 [IU] via SUBCUTANEOUS

## 2020-03-27 MED ORDER — EPOETIN ALFA-EPBX 40000 UNIT/ML IJ SOLN
INTRAMUSCULAR | Status: AC
  Filled 2020-03-27: qty 1

## 2020-03-27 NOTE — Patient Instructions (Signed)

## 2020-04-13 ENCOUNTER — Inpatient Hospital Stay (HOSPITAL_BASED_OUTPATIENT_CLINIC_OR_DEPARTMENT_OTHER): Payer: Medicare Other | Admitting: Family

## 2020-04-13 ENCOUNTER — Inpatient Hospital Stay: Payer: Medicare Other

## 2020-04-13 ENCOUNTER — Encounter: Payer: Self-pay | Admitting: Family

## 2020-04-13 ENCOUNTER — Other Ambulatory Visit: Payer: Self-pay

## 2020-04-13 VITALS — BP 142/63 | HR 70 | Temp 98.8°F | Resp 18 | Ht 62.0 in | Wt 116.1 lb

## 2020-04-13 DIAGNOSIS — D631 Anemia in chronic kidney disease: Secondary | ICD-10-CM

## 2020-04-13 DIAGNOSIS — D500 Iron deficiency anemia secondary to blood loss (chronic): Secondary | ICD-10-CM

## 2020-04-13 DIAGNOSIS — I129 Hypertensive chronic kidney disease with stage 1 through stage 4 chronic kidney disease, or unspecified chronic kidney disease: Secondary | ICD-10-CM | POA: Diagnosis not present

## 2020-04-13 LAB — CMP (CANCER CENTER ONLY)
ALT: 30 U/L (ref 0–44)
AST: 36 U/L (ref 15–41)
Albumin: 4 g/dL (ref 3.5–5.0)
Alkaline Phosphatase: 31 U/L — ABNORMAL LOW (ref 38–126)
Anion gap: 7 (ref 5–15)
BUN: 13 mg/dL (ref 8–23)
CO2: 28 mmol/L (ref 22–32)
Calcium: 9.7 mg/dL (ref 8.9–10.3)
Chloride: 107 mmol/L (ref 98–111)
Creatinine: 1.06 mg/dL — ABNORMAL HIGH (ref 0.44–1.00)
GFR, Estimated: 55 mL/min — ABNORMAL LOW (ref >60)
Glucose, Bld: 107 mg/dL — ABNORMAL HIGH (ref 70–99)
Potassium: 3.6 mmol/L (ref 3.5–5.1)
Sodium: 142 mmol/L (ref 135–145)
Total Bilirubin: 1.1 mg/dL (ref 0.3–1.2)
Total Protein: 7.1 g/dL (ref 6.5–8.1)

## 2020-04-13 LAB — RETICULOCYTES
Immature Retic Fract: 5.4 % (ref 2.3–15.9)
RBC.: 3.19 MIL/uL — ABNORMAL LOW (ref 3.87–5.11)
Retic Count, Absolute: 47.5 10*3/uL (ref 19.0–186.0)
Retic Ct Pct: 1.5 % (ref 0.4–3.1)

## 2020-04-13 LAB — CBC WITH DIFFERENTIAL (CANCER CENTER ONLY)
Abs Immature Granulocytes: 0.01 10*3/uL (ref 0.00–0.07)
Basophils Absolute: 0.1 10*3/uL (ref 0.0–0.1)
Basophils Relative: 1 %
Eosinophils Absolute: 0.1 10*3/uL (ref 0.0–0.5)
Eosinophils Relative: 2 %
HCT: 31.6 % — ABNORMAL LOW (ref 36.0–46.0)
Hemoglobin: 9.6 g/dL — ABNORMAL LOW (ref 12.0–15.0)
Immature Granulocytes: 0 %
Lymphocytes Relative: 22 %
Lymphs Abs: 1 10*3/uL (ref 0.7–4.0)
MCH: 30.1 pg (ref 26.0–34.0)
MCHC: 30.4 g/dL (ref 30.0–36.0)
MCV: 99.1 fL (ref 80.0–100.0)
Monocytes Absolute: 0.4 10*3/uL (ref 0.1–1.0)
Monocytes Relative: 9 %
Neutro Abs: 2.9 10*3/uL (ref 1.7–7.7)
Neutrophils Relative %: 66 %
Platelet Count: 297 10*3/uL (ref 150–400)
RBC: 3.19 MIL/uL — ABNORMAL LOW (ref 3.87–5.11)
RDW: 14.4 % (ref 11.5–15.5)
WBC Count: 4.4 10*3/uL (ref 4.0–10.5)
nRBC: 0 % (ref 0.0–0.2)

## 2020-04-13 MED ORDER — EPOETIN ALFA-EPBX 40000 UNIT/ML IJ SOLN
INTRAMUSCULAR | Status: AC
  Filled 2020-04-13: qty 1

## 2020-04-13 MED ORDER — EPOETIN ALFA-EPBX 40000 UNIT/ML IJ SOLN
40000.0000 [IU] | Freq: Once | INTRAMUSCULAR | Status: AC
  Administered 2020-04-13: 40000 [IU] via SUBCUTANEOUS

## 2020-04-13 NOTE — Progress Notes (Signed)
Hematology and Oncology Follow Up Visit  Rachael Davenport [id number] [birth year] 75 y.o. 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 She is doing well but notes fatigue at times.  She has also been having issues with lower extremity swelling. She states that her cardiologist is aware and has adjust her antihypertensive medications.  No pain or redness but 2+ pitting edema is noted in lower legs above the ankles. Pedal pulses are 2+. She plans to follow-up again with cardiology to update.  She has a new patient appointment with urology this week on Friday.  No fever, chills, n/v, cough, rash, dizziness, SOB, chest pain, palpitations, abdominal pain or changes in bowel or bladder habits.  No blood loss noted. No bruising or petechiae.  No falls or syncope to report.  She has maintained a good appetite and is staying well hydrated. Her weight is stable at 116 lbs.   ECOG Performance Status: 1 - Symptomatic but completely ambulatory  Medications:  Allergies as of 04/13/2020      Reactions   Penicillins Itching, Swelling   Has patient had a PCN reaction causing immediate rash, facial/tongue/throat swelling, SOB or lightheadedness with hypotension: Yes Has patient had a PCN reaction causing severe rash involving mucus membranes or skin necrosis: No Has patient had a PCN reaction that required hospitalization: No Has patient had a PCN reaction occurring within the last 10 years: No If all of the above answers are "NO", then may proceed with Cephalosporin use.   Valsartan Itching, Other (See Comments)   Heart sped up      Medication List       Accurate as of April 13, 2020 11:28 AM. If you have any questions, ask your nurse or doctor.        STOP taking these  medications   olmesartan 20 MG tablet Commonly known as: BENICAR Stopped by: Laverna Peace, NP     TAKE these medications   aspirin EC 81 MG tablet Take 81 mg by mouth daily.   Estradiol 4 MCG Inst Imvexxy Maintenance Pack 4 mcg vaginal insert  Insert 1 vaginal insert twice a week by vaginal route.   Fenofibric Acid 135 MG Cpdr fenofibric acid (choline) 135 mg capsule,delayed release   ipratropium 0.06 % nasal spray Commonly known as: ATROVENT Place into the nose.   Restasis 0.05 % ophthalmic emulsion Generic drug: cycloSPORINE INT 1 GTT IN OU BID UTD   SUMAtriptan 25 MG tablet Commonly known as: IMITREX Take 25 mg by mouth once as needed for migraine (MAY REPEAT ONCE IN 2 HOURS IF NO RELIEF (MAX 2 TABLETS/24 HOURS)).   Vitamin D (Ergocalciferol) 1.25 MG (50000 UNIT) Caps capsule Commonly known as: DRISDOL Take 50,000 Units by mouth every 7 (seven) days.       Allergies:  Allergies  Allergen Reactions  . Penicillins Itching and Swelling    Has patient had a PCN reaction causing immediate rash, facial/tongue/throat swelling, SOB or lightheadedness with hypotension: Yes Has patient had a PCN reaction causing severe rash involving mucus membranes or skin necrosis: No Has patient had a PCN reaction that required hospitalization: No Has patient had a PCN reaction occurring within the last 10 years: No If all of the above answers are "NO", then may proceed with Cephalosporin use.   . Valsartan Itching and  Other (See Comments)    Heart sped up    Past Medical History, Surgical history, Social history, and Family History were reviewed and updated.  Review of Systems: All other 10 point review of systems is negative.   Physical Exam:  vitals were not taken for this visit.   Wt Readings from Last 3 Encounters:  03/03/20 116 lb (52.6 kg)  03/02/20 118 lb 1.3 oz (53.6 kg)  02/04/20 118 lb 1.6 oz (53.6 kg)    Ocular: Sclerae unicteric, pupils equal, round and  reactive to light Ear-nose-throat: Oropharynx clear, dentition fair Lymphatic: No cervical or supraclavicular adenopathy Lungs no rales or rhonchi, good excursion bilaterally Heart regular rate and rhythm, no murmur appreciated Abd soft, nontender, positive bowel sounds MSK no focal spinal tenderness, no joint edema Neuro: non-focal, well-oriented, appropriate affect Breasts: Deferred   Lab Results  Component Value Date   WBC 4.4 04/13/2020   HGB 9.6 (L) 04/13/2020   HCT 31.6 (L) 04/13/2020   MCV 99.1 04/13/2020   PLT 297 04/13/2020   Lab Results  Component Value Date   FERRITIN 652 (H) 03/02/2020   IRON 113 03/02/2020   TIBC 413 03/02/2020   UIBC 300 03/02/2020   IRONPCTSAT 27 03/02/2020   Lab Results  Component Value Date   RETICCTPCT 1.5 04/13/2020   RBC 3.19 (L) 04/13/2020   RBC 3.19 (L) 04/13/2020   No results found for: KPAFRELGTCHN, LAMBDASER, KAPLAMBRATIO No results found for: IGGSERUM, IGA, IGMSERUM No results found for: Kathrynn Ducking, MSPIKE, SPEI   Chemistry      Component Value Date/Time   NA 142 04/13/2020 1050   NA 145 02/13/2017 1046   NA 142 12/23/2016 0951   K 3.6 04/13/2020 1050   K 4.6 02/13/2017 1046   K 3.9 12/23/2016 0951   CL 107 04/13/2020 1050   CL 108 02/13/2017 1046   CO2 28 04/13/2020 1050   CO2 28 02/13/2017 1046   CO2 25 12/23/2016 0951   BUN 13 04/13/2020 1050   BUN 22 02/13/2017 1046   BUN 15.0 12/23/2016 0951   CREATININE 1.06 (H) 04/13/2020 1050   CREATININE 1.1 02/13/2017 1046   CREATININE 0.9 12/23/2016 0951      Component Value Date/Time   CALCIUM 9.7 04/13/2020 1050   CALCIUM 9.7 02/13/2017 1046   CALCIUM 9.5 12/23/2016 0951   ALKPHOS 31 (L) 04/13/2020 1050   ALKPHOS 60 02/13/2017 1046   ALKPHOS 69 12/23/2016 0951   AST 36 04/13/2020 1050   AST 26 12/23/2016 0951   ALT 30 04/13/2020 1050   ALT 18 02/13/2017 1046   ALT 16 12/23/2016 0951   BILITOT 1.1 04/13/2020 1050    BILITOT 0.96 12/23/2016 0951       Impression and Plan: Rachael Davenport is a very pleasant 75yo African American female withanemia of chronic renal insufficiency and erythropoietin deficiency. She received ESA today for Hgb 9.6.  Lab and injection in 3 weeks, follow-up in 6 weeks.  Iron studies are pending. We will replace if needed. She can contact our office with any questions or concerns. We can certainly see her sooner if needed.   Laverna Peace, NP 1/24/202211:28 AM

## 2020-04-13 NOTE — Patient Instructions (Signed)

## 2020-04-14 ENCOUNTER — Encounter (HOSPITAL_BASED_OUTPATIENT_CLINIC_OR_DEPARTMENT_OTHER): Payer: Self-pay

## 2020-04-14 ENCOUNTER — Emergency Department (HOSPITAL_BASED_OUTPATIENT_CLINIC_OR_DEPARTMENT_OTHER): Payer: Medicare Other

## 2020-04-14 ENCOUNTER — Other Ambulatory Visit: Payer: Self-pay

## 2020-04-14 DIAGNOSIS — Z79899 Other long term (current) drug therapy: Secondary | ICD-10-CM | POA: Diagnosis not present

## 2020-04-14 DIAGNOSIS — I10 Essential (primary) hypertension: Secondary | ICD-10-CM | POA: Diagnosis not present

## 2020-04-14 DIAGNOSIS — R2243 Localized swelling, mass and lump, lower limb, bilateral: Secondary | ICD-10-CM | POA: Diagnosis present

## 2020-04-14 DIAGNOSIS — R600 Localized edema: Secondary | ICD-10-CM | POA: Insufficient documentation

## 2020-04-14 DIAGNOSIS — R0989 Other specified symptoms and signs involving the circulatory and respiratory systems: Secondary | ICD-10-CM | POA: Diagnosis not present

## 2020-04-14 DIAGNOSIS — E119 Type 2 diabetes mellitus without complications: Secondary | ICD-10-CM | POA: Diagnosis not present

## 2020-04-14 DIAGNOSIS — Z7982 Long term (current) use of aspirin: Secondary | ICD-10-CM | POA: Insufficient documentation

## 2020-04-14 LAB — IRON AND TIBC
Iron: 93 ug/dL (ref 41–142)
Saturation Ratios: 28 % (ref 21–57)
TIBC: 329 ug/dL (ref 236–444)
UIBC: 236 ug/dL (ref 120–384)

## 2020-04-14 LAB — CBC
HCT: 31.5 % — ABNORMAL LOW (ref 36.0–46.0)
Hemoglobin: 9.6 g/dL — ABNORMAL LOW (ref 12.0–15.0)
MCH: 30.6 pg (ref 26.0–34.0)
MCHC: 30.5 g/dL (ref 30.0–36.0)
MCV: 100.3 fL — ABNORMAL HIGH (ref 80.0–100.0)
Platelets: 277 10*3/uL (ref 150–400)
RBC: 3.14 MIL/uL — ABNORMAL LOW (ref 3.87–5.11)
RDW: 14.6 % (ref 11.5–15.5)
WBC: 4.5 10*3/uL (ref 4.0–10.5)
nRBC: 0 % (ref 0.0–0.2)

## 2020-04-14 LAB — BASIC METABOLIC PANEL
Anion gap: 9 (ref 5–15)
BUN: 13 mg/dL (ref 8–23)
CO2: 24 mmol/L (ref 22–32)
Calcium: 9.2 mg/dL (ref 8.9–10.3)
Chloride: 107 mmol/L (ref 98–111)
Creatinine, Ser: 1.05 mg/dL — ABNORMAL HIGH (ref 0.44–1.00)
GFR, Estimated: 55 mL/min — ABNORMAL LOW (ref >60)
Glucose, Bld: 100 mg/dL — ABNORMAL HIGH (ref 70–99)
Potassium: 3.5 mmol/L (ref 3.5–5.1)
Sodium: 140 mmol/L (ref 135–145)

## 2020-04-14 LAB — BRAIN NATRIURETIC PEPTIDE: B Natriuretic Peptide: 104.6 pg/mL — ABNORMAL HIGH (ref 0.0–100.0)

## 2020-04-14 LAB — FERRITIN: Ferritin: 595 ng/mL — ABNORMAL HIGH (ref 11–307)

## 2020-04-14 NOTE — ED Triage Notes (Signed)
Pt arrives complaining of bilateral lower extremity edema. 3+ pitting noted since 1/17. Denies hx of CHF. Denies CP/SOB. Hypertensive during triage

## 2020-04-15 ENCOUNTER — Other Ambulatory Visit (HOSPITAL_BASED_OUTPATIENT_CLINIC_OR_DEPARTMENT_OTHER): Payer: Self-pay | Admitting: Emergency Medicine

## 2020-04-15 ENCOUNTER — Ambulatory Visit (HOSPITAL_BASED_OUTPATIENT_CLINIC_OR_DEPARTMENT_OTHER): Payer: Medicare Other

## 2020-04-15 ENCOUNTER — Emergency Department (HOSPITAL_BASED_OUTPATIENT_CLINIC_OR_DEPARTMENT_OTHER)
Admission: EM | Admit: 2020-04-15 | Discharge: 2020-04-15 | Disposition: A | Discharge status: Home / Self Care | Payer: Medicare Other | Attending: Emergency Medicine | Admitting: Emergency Medicine

## 2020-04-15 ENCOUNTER — Emergency Department (HOSPITAL_BASED_OUTPATIENT_CLINIC_OR_DEPARTMENT_OTHER): Admit: 2020-04-15 | Payer: Medicare Other

## 2020-04-15 ENCOUNTER — Telehealth (HOSPITAL_BASED_OUTPATIENT_CLINIC_OR_DEPARTMENT_OTHER): Payer: Self-pay | Admitting: Emergency Medicine

## 2020-04-15 ENCOUNTER — Emergency Department (HOSPITAL_BASED_OUTPATIENT_CLINIC_OR_DEPARTMENT_OTHER)
Admission: RE | Admit: 2020-04-15 | Discharge: 2020-04-15 | Disposition: A | Discharge status: Home / Self Care | Payer: Medicare Other | Source: Ambulatory Visit | Attending: Emergency Medicine | Admitting: Emergency Medicine

## 2020-04-15 DIAGNOSIS — R0989 Other specified symptoms and signs involving the circulatory and respiratory systems: Secondary | ICD-10-CM

## 2020-04-15 DIAGNOSIS — R609 Edema, unspecified: Secondary | ICD-10-CM

## 2020-04-15 LAB — D-DIMER, QUANTITATIVE: D-Dimer, Quant: 0.8 ug/mL-FEU — ABNORMAL HIGH (ref 0.00–0.50)

## 2020-04-15 MED ORDER — FUROSEMIDE 20 MG PO TABS
20.0000 mg | ORAL_TABLET | Freq: Once | ORAL | Status: AC
  Administered 2020-04-15: 20 mg via ORAL
  Filled 2020-04-15: qty 1

## 2020-04-15 MED ORDER — RIVAROXABAN 15 MG PO TABS
15.0000 mg | ORAL_TABLET | Freq: Once | ORAL | Status: AC
  Administered 2020-04-15: 15 mg via ORAL
  Filled 2020-04-15: qty 1

## 2020-04-15 MED ORDER — FUROSEMIDE 20 MG PO TABS: 20.0000 mg | ORAL_TABLET | Freq: Every day | ORAL | 0 refills | Status: DC

## 2020-04-15 MED FILL — FUROSEMIDE 20 MG TAB: 20 | 7 days supply | Qty: 7 | Fill #0

## 2020-04-15 NOTE — ED Notes (Signed)
Patient verbalizes understanding of discharge instructions. Opportunity for questioning and answers were provided. Armband removed by staff, pt discharged from ED ambulatory to home.  

## 2020-04-15 NOTE — Telephone Encounter (Signed)
Patient has returned for US of the legs bilaterally.  Hx of swelling in the past.  Not currently on lasix.    DVT study negative, will give a short course of lasix with PCP follow up.

## 2020-04-15 NOTE — ED Provider Notes (Signed)
[facility] DEPT [facility] Provider Note: Georgena Spurling, MD, FACEP  CSN: [id number] MRN: [id number] ARRIVAL: 04/14/20 at [place]: [place]  Leg Swelling   HISTORY OF PRESENT ILLNESS  04/15/20 3:52 AM Rachael Davenport is a 75 y.o. female with swelling of her lower legs, ankles and feet since 04/06/2020.  The edema is pitting in nature.  It is not painful but her legs do feel heavy or tight.  Symptoms are moderate.  Is somewhat better when she lies with her feet up and somewhat worse when she stands.  She denies any chest pain or shortness of breath.  She does not have a history of CHF.  There is no associated redness or warmth.   Past Medical History:  Diagnosis Date  . Diabetes mellitus without complication ([place])   . Hypertension   . Iron deficiency anemia due to chronic blood loss 07/09/2018    Past Surgical History:  Procedure Laterality Date  . BUNIONECTOMY      History reviewed. No pertinent family history.  Social History   Tobacco Use  . Smoking status: Never Smoker  . Smokeless tobacco: Never Used  Vaping Use  . Vaping Use: Never used  Substance Use Topics  . Alcohol use: Never  . Drug use: Never    Prior to Admission medications   Medication Sig Start Date End Date Taking? Authorizing Provider  aspirin EC 81 MG tablet Take 81 mg by mouth daily.    [provider]  carvedilol (COREG) 3.125 MG tablet Take 3.125 mg by mouth 2 (two) times daily. 04/09/20   [provider]  Choline Fenofibrate (FENOFIBRIC ACID) 135 MG CPDR fenofibric acid (choline) 135 mg capsule,delayed release 05/09/17   [provider]  Estradiol 4 MCG INST Imvexxy Maintenance Pack 4 mcg vaginal insert  Insert 1 vaginal insert twice a week by vaginal route.    [provider]  ipratropium (ATROVENT) 0.06 % nasal spray Place into the nose. 05/06/19 06/05/19  [provider]  RESTASIS 0.05 % ophthalmic emulsion INT 1 GTT IN OU BID  UTD 04/05/17   [provider]  SUMAtriptan (IMITREX) 25 MG tablet Take 25 mg by mouth once as needed for migraine (MAY REPEAT ONCE IN 2 HOURS IF NO RELIEF (MAX 2 TABLETS/24 HOURS)).  06/08/15   [provider]  Vitamin D, Ergocalciferol, (DRISDOL) 50000 units CAPS capsule Take 50,000 Units by mouth every 7 (seven) days. 05/12/15   [provider]    Allergies Penicillins and Valsartan   REVIEW OF SYSTEMS  Negative except as noted here or in the History of Present Illness.   PHYSICAL EXAMINATION  Initial Vital Signs Blood pressure (!) 154/69, pulse 81, temperature 98.7 F (37.1 C), temperature source Oral, resp. rate 16, height '5\' 2"'$  (1.575 m), weight 51.7 kg, SpO2 100 %.  Examination General: Well-developed, well-nourished female in no acute distress; appearance consistent with age of record HENT: normocephalic; atraumatic Eyes: pupils equal, round and reactive to light; extraocular muscles intact Neck: supple Heart: regular rate and rhythm Lungs: clear to auscultation bilaterally Abdomen: soft; nondistended; nontender; bowel sounds present Extremities: No deformity; full range of motion; pulses normal; 2+ pitting edema of lower legs Neurologic: Awake, alert and oriented; motor function intact in all extremities and symmetric; no facial droop Skin: Warm and dry Psychiatric: Normal mood and affect   RESULTS  Summary of this visit's results, reviewed and interpreted by myself:   EKG Interpretation  Date/Time:  Tuesday April 14 2020 19:10:12 EST Ventricular Rate:  78 PR Interval:  140 QRS Duration: 68 QT Interval:  342 QTC Calculation: 389 R Axis:   55 Text Interpretation: Normal sinus rhythm Normal ECG Rate is slower Confirmed by Petrona Wyeth, Jenny Reichmann [phone]) on 04/15/2020 3:34:51 AM      Laboratory Studies: Results for orders placed or performed during the hospital encounter of 04/15/20 (from the past 24 hour(s))  Basic metabolic panel     Status:  Abnormal   Collection Time: 04/14/20  7:12 PM  Result Value Ref Range   Sodium 140 135 - 145 mmol/L   Potassium 3.5 3.5 - 5.1 mmol/L   Chloride 107 98 - 111 mmol/L   CO2 24 22 - 32 mmol/L   Glucose, Bld 100 (H) 70 - 99 mg/dL   BUN 13 8 - 23 mg/dL   Creatinine, Ser 1.05 (H) 0.44 - 1.00 mg/dL   Calcium 9.2 8.9 - 10.3 mg/dL   GFR, Estimated 55 (L) >60 mL/min   Anion gap 9 5 - 15  CBC     Status: Abnormal   Collection Time: 04/14/20  7:12 PM  Result Value Ref Range   WBC 4.5 4.0 - 10.5 K/uL   RBC 3.14 (L) 3.87 - 5.11 MIL/uL   Hemoglobin 9.6 (L) 12.0 - 15.0 g/dL   HCT 31.5 (L) 36.0 - 46.0 %   MCV 100.3 (H) 80.0 - 100.0 fL   MCH 30.6 26.0 - 34.0 pg   MCHC 30.5 30.0 - 36.0 g/dL   RDW 14.6 11.5 - 15.5 %   Platelets 277 150 - 400 K/uL   nRBC 0.0 0.0 - 0.2 %  Brain natriuretic peptide     Status: Abnormal   Collection Time: 04/14/20  7:12 PM  Result Value Ref Range   B Natriuretic Peptide 104.6 (H) 0.0 - 100.0 pg/mL  D-dimer, quantitative (not at [facility])     Status: Abnormal   Collection Time: 04/15/20  4:10 AM  Result Value Ref Range   D-Dimer, Quant 0.80 (H) 0.00 - 0.50 ug/mL-FEU   Imaging Studies: DG Chest 2 View  Result Date: 04/14/2020 CLINICAL DATA:  Lower extremity edema EXAM: CHEST - 2 VIEW COMPARISON:  06/01/2017 FINDINGS: The heart size and mediastinal contours are within normal limits. Both lungs are clear. Aortic atherosclerosis. The visualized skeletal structures are unremarkable. IMPRESSION: No active cardiopulmonary disease. Electronically Signed   By: Donavan Foil M.D.   On: 04/14/2020 19:40    ED COURSE and MDM  Nursing notes, initial and subsequent vitals signs, including pulse oximetry, reviewed and interpreted by myself.  Vitals:   04/14/20 2239 04/15/20 0018 04/15/20 0347 04/15/20 0508  BP: (!) 160/70 (!) 162/71 (!) 154/69 (!) 154/72  Pulse: 95 72 81 90  Resp: '14 19 16 16  '$ Temp: 98.8 F (37.1 C)  98.7 F (37.1 C)   TempSrc: Oral  Oral   SpO2: 98% 98%  100% 99%  Weight:      Height:       Medications  Rivaroxaban (XARELTO) tablet 15 mg (15 mg Oral Given 04/15/20 0505)  furosemide (LASIX) tablet 20 mg (20 mg Oral Given 04/15/20 0506)    4:48 AM D-dimer is elevated.  We will obtain Doppler ultrasounds of her lower extremities later this morning.  I suspect this is more likely dependent edema and we will also try Lasix and diurese her.  5:44 AM The patient would prefer to be discharged and return for Doppler studies later today.  PROCEDURES  Procedures   ED DIAGNOSES     ICD-10-CM   1. Peripheral edema  R60.9   2. Suspected DVT (deep vein thrombosis)  R09.89        Shanon Rosser, MD 04/15/20 [phone]

## 2020-05-04 ENCOUNTER — Inpatient Hospital Stay: Payer: Medicare Other | Attending: Family

## 2020-05-04 ENCOUNTER — Telehealth: Payer: Self-pay | Admitting: *Deleted

## 2020-05-04 ENCOUNTER — Inpatient Hospital Stay: Payer: Medicare Other

## 2020-05-04 ENCOUNTER — Other Ambulatory Visit: Payer: Self-pay

## 2020-05-04 VITALS — BP 128/60 | HR 60 | Temp 100.0°F | Resp 17

## 2020-05-04 DIAGNOSIS — D631 Anemia in chronic kidney disease: Secondary | ICD-10-CM

## 2020-05-04 DIAGNOSIS — N189 Chronic kidney disease, unspecified: Secondary | ICD-10-CM | POA: Insufficient documentation

## 2020-05-04 DIAGNOSIS — D500 Iron deficiency anemia secondary to blood loss (chronic): Secondary | ICD-10-CM

## 2020-05-04 DIAGNOSIS — I129 Hypertensive chronic kidney disease with stage 1 through stage 4 chronic kidney disease, or unspecified chronic kidney disease: Secondary | ICD-10-CM | POA: Diagnosis not present

## 2020-05-04 DIAGNOSIS — Z79899 Other long term (current) drug therapy: Secondary | ICD-10-CM | POA: Insufficient documentation

## 2020-05-04 LAB — CBC WITH DIFFERENTIAL (CANCER CENTER ONLY)
Abs Immature Granulocytes: 0.01 10*3/uL (ref 0.00–0.07)
Basophils Absolute: 0 10*3/uL (ref 0.0–0.1)
Basophils Relative: 0 %
Eosinophils Absolute: 0 10*3/uL (ref 0.0–0.5)
Eosinophils Relative: 0 %
HCT: 34.9 % — ABNORMAL LOW (ref 36.0–46.0)
Hemoglobin: 10.9 g/dL — ABNORMAL LOW (ref 12.0–15.0)
Immature Granulocytes: 0 %
Lymphocytes Relative: 21 %
Lymphs Abs: 0.8 10*3/uL (ref 0.7–4.0)
MCH: 30.9 pg (ref 26.0–34.0)
MCHC: 31.2 g/dL (ref 30.0–36.0)
MCV: 98.9 fL (ref 80.0–100.0)
Monocytes Absolute: 0.4 10*3/uL (ref 0.1–1.0)
Monocytes Relative: 11 %
Neutro Abs: 2.5 10*3/uL (ref 1.7–7.7)
Neutrophils Relative %: 68 %
Platelet Count: 149 10*3/uL — ABNORMAL LOW (ref 150–400)
RBC: 3.53 MIL/uL — ABNORMAL LOW (ref 3.87–5.11)
RDW: 14.5 % (ref 11.5–15.5)
WBC Count: 3.8 10*3/uL — ABNORMAL LOW (ref 4.0–10.5)
nRBC: 0 % (ref 0.0–0.2)

## 2020-05-04 LAB — CMP (CANCER CENTER ONLY)
ALT: 44 U/L (ref 0–44)
AST: 73 U/L — ABNORMAL HIGH (ref 15–41)
Albumin: 4.4 g/dL (ref 3.5–5.0)
Alkaline Phosphatase: 26 U/L — ABNORMAL LOW (ref 38–126)
Anion gap: 8 (ref 5–15)
BUN: 32 mg/dL — ABNORMAL HIGH (ref 8–23)
CO2: 22 mmol/L (ref 22–32)
Calcium: 10 mg/dL (ref 8.9–10.3)
Chloride: 103 mmol/L (ref 98–111)
Creatinine: 2.46 mg/dL — ABNORMAL HIGH (ref 0.44–1.00)
GFR, Estimated: 20 mL/min — ABNORMAL LOW (ref >60)
Glucose, Bld: 130 mg/dL — ABNORMAL HIGH (ref 70–99)
Potassium: 6 mmol/L — ABNORMAL HIGH (ref 3.5–5.1)
Sodium: 133 mmol/L — ABNORMAL LOW (ref 135–145)
Total Bilirubin: 1.1 mg/dL (ref 0.3–1.2)
Total Protein: 7.2 g/dL (ref 6.5–8.1)

## 2020-05-04 MED ORDER — EPOETIN ALFA-EPBX 40000 UNIT/ML IJ SOLN
INTRAMUSCULAR | Status: AC
  Filled 2020-05-04: qty 1

## 2020-05-04 MED ORDER — EPOETIN ALFA-EPBX 40000 UNIT/ML IJ SOLN
40000.0000 [IU] | Freq: Once | INTRAMUSCULAR | Status: AC
  Administered 2020-05-04: 40000 [IU] via SUBCUTANEOUS

## 2020-05-04 NOTE — Patient Instructions (Signed)

## 2020-05-04 NOTE — Telephone Encounter (Signed)
Pt notified per order of S. Chicken NP that her potassium is high again and to head to the ED if she starts to have any issues like lethargy or palpitations. Pt denies any lethargy, palpitations or other complaints at this time and does not feel as though she needs to go to the ED.  Pt informed that CBC and CMET results have been faxed to Dr. Katherine Roan and Dr. Harlow Asa and to contact Dr. Puschinsky's office for f/u per order of S. [place] NP.  Pt states that she will contact Dr. Puschinsky's office now regarding elevated potassium.

## 2020-05-05 ENCOUNTER — Telehealth: Payer: Self-pay | Admitting: *Deleted

## 2020-05-05 NOTE — Telephone Encounter (Signed)
Message received from patient requesting a call back concerned about her elevated blood sugar.  Call placed back to patient.  Pt states that she ate prior to blood draw on 05/04/20.  Pt instructed to not be concerned about elevated BS at this time, due to eating prior to lab draw can elevate glucose.  Pt appreciative of call and has no further questions at this time.

## 2020-05-14 ENCOUNTER — Encounter (HOSPITAL_COMMUNITY): Payer: Self-pay | Admitting: Emergency Medicine

## 2020-05-14 ENCOUNTER — Emergency Department (HOSPITAL_COMMUNITY)
Admission: EM | Admit: 2020-05-14 | Discharge: 2020-05-14 | Disposition: A | Discharge status: Home / Self Care | Payer: Medicare Other | Attending: Emergency Medicine | Admitting: Emergency Medicine

## 2020-05-14 ENCOUNTER — Other Ambulatory Visit: Payer: Self-pay

## 2020-05-14 DIAGNOSIS — Z8616 Personal history of COVID-19: Secondary | ICD-10-CM | POA: Insufficient documentation

## 2020-05-14 DIAGNOSIS — K5900 Constipation, unspecified: Secondary | ICD-10-CM | POA: Diagnosis not present

## 2020-05-14 DIAGNOSIS — R059 Cough, unspecified: Secondary | ICD-10-CM | POA: Diagnosis not present

## 2020-05-14 DIAGNOSIS — R630 Anorexia: Secondary | ICD-10-CM | POA: Diagnosis present

## 2020-05-14 DIAGNOSIS — U071 COVID-19: Secondary | ICD-10-CM

## 2020-05-14 MED ORDER — DOCUSATE SODIUM 250 MG PO CAPS: 250.0000 mg | ORAL_CAPSULE | Freq: Every day | ORAL | 0 refills | Status: DC

## 2020-05-14 MED ORDER — BENZONATATE 100 MG PO CAPS
100.0000 mg | ORAL_CAPSULE | Freq: Three times a day (TID) | ORAL | 0 refills | Status: DC
Start: 2020-05-14 — End: 2020-10-08

## 2020-05-14 NOTE — ED Triage Notes (Signed)
Per pt, states she tested positive for covid last Thursday-states she is on 5 medications for covid-states she is still having chills and is weak...daughter came in earlier requesting her mother be admitted for the infusion and to get a covid shot-states she was told by her PCP to bring her here for an infusion-states she went to clinic but they were closed

## 2020-05-14 NOTE — ED Provider Notes (Signed)
[facility] DEPT Provider Note   CSN: [id number] Arrival date & time: 05/14/20  1621     History Chief Complaint  Patient presents with  . Chills  . Covid Positive    Rachael Davenport is a 74 y.o. female.  74 year old female diagnosed Covid 7 days ago but has had symptoms for over 2 weeks presents for evaluation.  She denies any recent fever, vomiting, diarrhea.  States that she has no dyspnea.  No pain in her legs.  Somewhat decreased appetite but is eating and drinking appropriately.  Noted to go for monoclonal antibody infusion but because she is 14 days out from under symptoms started she did not qualify.  Denies any urinary symptoms.  Is currently taking medications prescribed by her primary care doctor.        Past Medical History:  Diagnosis Date  . Diabetes mellitus without complication ([place])   . Hypertension   . Iron deficiency anemia due to chronic blood loss 07/09/2018    Patient Active Problem List   Diagnosis Date Noted  . Iron deficiency anemia due to chronic blood loss 07/09/2018  . 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 BUNIONECTOMY       OB History   No obstetric history on file.     No family history on file.  Social History   Tobacco Use  . Smoking status: Never Smoker  . Smokeless tobacco: Never Used  Vaping Use  . Vaping Use: Never used  Substance Use Topics  . Alcohol use: Never  . Drug use: Never    Home Medications Prior to Admission medications   Medication Sig Start Date End Date Taking? Authorizing Provider  aspirin EC 81 MG tablet Take 81 mg by mouth daily.    [provider]  carvedilol (COREG) 3.125 MG tablet Take 3.125 mg by mouth 2 (two) times daily. 04/09/20   [provider]  Choline Fenofibrate (FENOFIBRIC ACID) 135 MG CPDR fenofibric acid (choline) 135 mg capsule,delayed release 05/09/17   [provider]  Estradiol 4 MCG INST Imvexxy Maintenance Pack 4 mcg vaginal insert  Insert 1 vaginal insert twice a week by vaginal route.    [provider]  furosemide (LASIX) 20 MG tablet Take 1 tablet (20 mg total) by mouth daily. 04/15/20   Deno Etienne, DO  ipratropium (ATROVENT) 0.06 % nasal spray Place into the nose. 05/06/19 06/05/19  [provider]  RESTASIS 0.05 % ophthalmic emulsion INT 1 GTT IN OU BID UTD 04/05/17   [provider]  SUMAtriptan (IMITREX) 25 MG tablet Take 25 mg by mouth once as needed for migraine (MAY REPEAT ONCE IN 2 HOURS IF NO RELIEF (MAX 2 TABLETS/24 HOURS)).  06/08/15   [provider]  Vitamin D, Ergocalciferol, (DRISDOL) 50000 units CAPS capsule Take 50,000 Units by mouth every 7 (seven) days. 05/12/15   [provider]    Allergies    Penicillins and Valsartan  Review of Systems   Review of Systems  All other systems reviewed and are negative.   Physical Exam Updated Vital Signs BP 133/81 (BP Location: Left Arm)   Pulse 91   Temp 98.2 F (36.8 C) (Oral)   Resp 17   Ht 1.575 m ('5\' 2"'$ )   Wt 47.2 kg   SpO2 98%   BMI 19.02 kg/m   Physical Exam Vitals and nursing note reviewed.  Constitutional:  General: She is not in acute distress.    Appearance: Normal appearance. She is well-developed and well-nourished. She is not toxic-appearing.  HENT:     Head: Normocephalic and atraumatic.  Eyes:     General: Lids are normal.     Extraocular Movements: EOM normal.     Conjunctiva/sclera: Conjunctivae normal.     Pupils: Pupils are equal, round, and reactive to light.  Neck:     Thyroid: No thyroid mass.     Trachea: No tracheal deviation.  Cardiovascular:     Rate and Rhythm: Normal rate and regular rhythm.     Heart sounds: Normal heart sounds. No murmur heard. No gallop.   Pulmonary:     Effort: Pulmonary effort is normal. No respiratory distress.     Breath sounds: Normal breath sounds. No  stridor. No decreased breath sounds, wheezing, rhonchi or rales.  Abdominal:     General: Bowel sounds are normal. There is no distension.     Palpations: Abdomen is soft.     Tenderness: There is no abdominal tenderness. There is no CVA tenderness or rebound.  Musculoskeletal:        General: No tenderness or edema. Normal range of motion.     Cervical back: Normal range of motion and neck supple.  Skin:    General: Skin is warm and dry.     Findings: No abrasion or rash.  Neurological:     Mental Status: She is alert and oriented to person, place, and time.     GCS: GCS eye subscore is 4. GCS verbal subscore is 5. GCS motor subscore is 6.     Cranial Nerves: No cranial nerve deficit.     Sensory: No sensory deficit.     Deep Tendon Reflexes: Strength normal.  Psychiatric:        Mood and Affect: Mood and affect normal.        Speech: Speech normal.        Behavior: Behavior normal.     ED Results / Procedures / Treatments   Labs (all labs ordered are listed, but only abnormal results are displayed) Labs Reviewed - No data to display  EKG None  Radiology No results found.  Procedures Procedures   Medications Ordered in ED Medications - No data to display  ED Course  I have reviewed the triage vital signs and the nursing notes.  Pertinent labs & imaging results that were available during my care of the patient were reviewed by me and considered in my medical decision making (see chart for details).    MDM Rules/Calculators/A&P                          Patient's lungs are clear here.  Pulse oximetry is 90% on room air.  She is nontoxic-appearing and does not need any acute Covid therapies.  She notes some constipation will prescribe Colace for that.  Also notes chronic cough for several weeks.  Will prescribe Tessalon Perles. Final Clinical Impression(s) / ED Diagnoses Final diagnoses:  None    Rx / DC Orders ED Discharge Orders    None       Lacretia Leigh, MD 05/14/20 1704

## 2020-05-25 ENCOUNTER — Other Ambulatory Visit: Payer: Medicare Other

## 2020-05-25 ENCOUNTER — Ambulatory Visit: Payer: Medicare Other

## 2020-05-25 ENCOUNTER — Ambulatory Visit: Payer: Medicare Other | Admitting: Family

## 2020-05-29 ENCOUNTER — Inpatient Hospital Stay: Payer: Medicare Other | Admitting: Family

## 2020-05-29 ENCOUNTER — Other Ambulatory Visit: Payer: Self-pay

## 2020-05-29 ENCOUNTER — Inpatient Hospital Stay: Payer: Medicare Other

## 2020-05-29 ENCOUNTER — Inpatient Hospital Stay: Payer: Medicare Other | Attending: Family

## 2020-05-29 ENCOUNTER — Telehealth: Payer: Self-pay

## 2020-05-29 VITALS — BP 124/60 | HR 69 | Resp 19 | Wt 101.8 lb

## 2020-05-29 DIAGNOSIS — D500 Iron deficiency anemia secondary to blood loss (chronic): Secondary | ICD-10-CM

## 2020-05-29 DIAGNOSIS — Z79899 Other long term (current) drug therapy: Secondary | ICD-10-CM | POA: Diagnosis not present

## 2020-05-29 DIAGNOSIS — D631 Anemia in chronic kidney disease: Secondary | ICD-10-CM

## 2020-05-29 DIAGNOSIS — I129 Hypertensive chronic kidney disease with stage 1 through stage 4 chronic kidney disease, or unspecified chronic kidney disease: Secondary | ICD-10-CM | POA: Insufficient documentation

## 2020-05-29 DIAGNOSIS — N189 Chronic kidney disease, unspecified: Secondary | ICD-10-CM | POA: Insufficient documentation

## 2020-05-29 LAB — CBC WITH DIFFERENTIAL (CANCER CENTER ONLY)
Abs Immature Granulocytes: 0.02 10*3/uL (ref 0.00–0.07)
Basophils Absolute: 0 10*3/uL (ref 0.0–0.1)
Basophils Relative: 1 %
Eosinophils Absolute: 0.1 10*3/uL (ref 0.0–0.5)
Eosinophils Relative: 2 %
HCT: 33 % — ABNORMAL LOW (ref 36.0–46.0)
Hemoglobin: 10.1 g/dL — ABNORMAL LOW (ref 12.0–15.0)
Immature Granulocytes: 1 %
Lymphocytes Relative: 26 %
Lymphs Abs: 0.7 10*3/uL (ref 0.7–4.0)
MCH: 30.6 pg (ref 26.0–34.0)
MCHC: 30.6 g/dL (ref 30.0–36.0)
MCV: 100 fL (ref 80.0–100.0)
Monocytes Absolute: 0.3 10*3/uL (ref 0.1–1.0)
Monocytes Relative: 11 %
Neutro Abs: 1.7 10*3/uL (ref 1.7–7.7)
Neutrophils Relative %: 59 %
Platelet Count: 172 10*3/uL (ref 150–400)
RBC: 3.3 MIL/uL — ABNORMAL LOW (ref 3.87–5.11)
RDW: 14.7 % (ref 11.5–15.5)
WBC Count: 2.8 10*3/uL — ABNORMAL LOW (ref 4.0–10.5)
nRBC: 0 % (ref 0.0–0.2)

## 2020-05-29 LAB — CMP (CANCER CENTER ONLY)
ALT: 56 U/L — ABNORMAL HIGH (ref 0–44)
AST: 85 U/L — ABNORMAL HIGH (ref 15–41)
Albumin: 3.7 g/dL (ref 3.5–5.0)
Alkaline Phosphatase: 27 U/L — ABNORMAL LOW (ref 38–126)
Anion gap: 6 (ref 5–15)
BUN: 17 mg/dL (ref 8–23)
CO2: 28 mmol/L (ref 22–32)
Calcium: 9.7 mg/dL (ref 8.9–10.3)
Chloride: 104 mmol/L (ref 98–111)
Creatinine: 1.1 mg/dL — ABNORMAL HIGH (ref 0.44–1.00)
GFR, Estimated: 52 mL/min — ABNORMAL LOW (ref >60)
Glucose, Bld: 135 mg/dL — ABNORMAL HIGH (ref 70–99)
Potassium: 3.7 mmol/L (ref 3.5–5.1)
Sodium: 138 mmol/L (ref 135–145)
Total Bilirubin: 1.1 mg/dL (ref 0.3–1.2)
Total Protein: 6.5 g/dL (ref 6.5–8.1)

## 2020-05-29 LAB — RETICULOCYTES
Immature Retic Fract: 2.1 % — ABNORMAL LOW (ref 2.3–15.9)
RBC.: 3.29 MIL/uL — ABNORMAL LOW (ref 3.87–5.11)
Retic Count, Absolute: 26 10*3/uL (ref 19.0–186.0)
Retic Ct Pct: 0.8 % (ref 0.4–3.1)

## 2020-05-29 MED ORDER — EPOETIN ALFA-EPBX 40000 UNIT/ML IJ SOLN
INTRAMUSCULAR | Status: AC
  Filled 2020-05-29: qty 1

## 2020-05-29 MED ORDER — EPOETIN ALFA-EPBX 40000 UNIT/ML IJ SOLN
40000.0000 [IU] | Freq: Once | INTRAMUSCULAR | Status: AC
  Administered 2020-05-29: 40000 [IU] via SUBCUTANEOUS

## 2020-05-29 NOTE — Patient Instructions (Signed)

## 2020-05-29 NOTE — Progress Notes (Signed)
Hematology and Oncology Follow Up Visit  Rachael Davenport [id number] May 08, 1945 75 y.o. 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 She had Covid 3 weeks ago and thankfully her symptoms were light. She had a cough with some mild congestion and runny nose. She got a new PCP who she likes very much. I did forward today's lab work to their office. She has not noted any blood loss. No abnormal bruising, no petechiae.  No fever, chills, n/v, cough, rash, dizziness, SOB, chest pain, palpitations, abdominal pain or changes in bowel or bladder habits.  She has mild puffiness in her ankles that comes and goes. Pedal pulses are 2+.  No numbness or tingling in her extremities.  No falls or syncope.  She has a good appetite and is doing her best to stay well hydrated. Her weight is stable.   ECOG Performance Status: 1 - Symptomatic but completely ambulatory  Medications:  Allergies as of 05/29/2020      Reactions   Penicillins Itching, Swelling   Has patient had a PCN reaction causing immediate rash, facial/tongue/throat swelling, SOB or lightheadedness with hypotension: Yes Has patient had a PCN reaction causing severe rash involving mucus membranes or skin necrosis: No Has patient had a PCN reaction that required hospitalization: No Has patient had a PCN reaction occurring within the last 10 years: No If all of the above answers are "NO", then may proceed with Cephalosporin use.   Valsartan Itching, Other (See Comments)   Heart sped up      Medication List       Accurate as of May 29, 2020 11:48 AM. If you have any questions, ask your nurse or doctor.        aspirin EC 81 MG tablet Take 81 mg by mouth daily.   benzonatate 100 MG capsule Commonly known as: TESSALON Take  1 capsule (100 mg total) by mouth every 8 (eight) hours.   carvedilol 3.125 MG tablet Commonly known as: COREG Take 3.125 mg by mouth 2 (two) times daily.   docusate sodium 250 MG capsule Commonly known as: COLACE Take 1 capsule (250 mg total) by mouth daily.   Estradiol 4 MCG Inst Imvexxy Maintenance Pack 4 mcg vaginal insert  Insert 1 vaginal insert twice a week by vaginal route.   Fenofibric Acid 135 MG Cpdr fenofibric acid (choline) 135 mg capsule,delayed release   furosemide 20 MG tablet Commonly known as: LASIX Take by mouth. What changed: Another medication with the same name was removed. Continue taking this medication, and follow the directions you see here. Changed by: Laverna Peace, NP   ipratropium 0.06 % nasal spray Commonly known as: ATROVENT Place into the nose.   Restasis 0.05 % ophthalmic emulsion Generic drug: cycloSPORINE INT 1 GTT IN OU BID UTD   SUMAtriptan 25 MG tablet Commonly known as: IMITREX Take 25 mg by mouth once as needed for migraine (MAY REPEAT ONCE IN 2 HOURS IF NO RELIEF (MAX 2 TABLETS/24 HOURS)).   Vitamin D (Ergocalciferol) 1.25 MG (50000 UNIT) Caps capsule Commonly known as: DRISDOL Take 50,000 Units by mouth every 7 (seven) days.       Allergies:  Allergies  Allergen Reactions  . Penicillins Itching and Swelling    Has patient had a PCN reaction causing immediate rash, facial/tongue/throat swelling, SOB or lightheadedness  with hypotension: Yes Has patient had a PCN reaction causing severe rash involving mucus membranes or skin necrosis: No Has patient had a PCN reaction that required hospitalization: No Has patient had a PCN reaction occurring within the last 10 years: No If all of the above answers are "NO", then may proceed with Cephalosporin use.   . Valsartan Itching and Other (See Comments)    Heart sped up    Past Medical History, Surgical history, Social history, and Family History were reviewed and  updated.  Review of Systems: All other 10 point review of systems is negative.   Physical Exam:  weight is 101 lb 12.8 oz (46.2 kg). Her blood pressure is 124/60 and her pulse is 69. Her respiration is 19 and oxygen saturation is 100%.   Wt Readings from Last 3 Encounters:  05/29/20 101 lb 12.8 oz (46.2 kg)  05/14/20 104 lb (47.2 kg)  04/14/20 114 lb (51.7 kg)    Ocular: Sclerae unicteric, pupils equal, round and reactive to light Ear-nose-throat: Oropharynx clear, dentition fair Lymphatic: No cervical or supraclavicular adenopathy Lungs no rales or rhonchi, good excursion bilaterally Heart regular rate and rhythm, no murmur appreciated Abd soft, nontender, positive bowel sounds MSK no focal spinal tenderness, no joint edema Neuro: non-focal, well-oriented, appropriate affect Breasts: Deferred   Lab Results  Component Value Date   WBC 2.8 (L) 05/29/2020   HGB 10.1 (L) 05/29/2020   HCT 33.0 (L) 05/29/2020   MCV 100.0 05/29/2020   PLT 172 05/29/2020   Lab Results  Component Value Date   FERRITIN 595 (H) 04/13/2020   IRON 93 04/13/2020   TIBC 329 04/13/2020   UIBC 236 04/13/2020   IRONPCTSAT 28 04/13/2020   Lab Results  Component Value Date   RETICCTPCT 0.8 05/29/2020   RBC 3.30 (L) 05/29/2020   RBC 3.29 (L) 05/29/2020   No results found for: KPAFRELGTCHN, LAMBDASER, KAPLAMBRATIO No results found for: IGGSERUM, IGA, IGMSERUM No results found for: Kathrynn Ducking, MSPIKE, SPEI   Chemistry      Component Value Date/Time   NA 138 05/29/2020 1057   NA 145 02/13/2017 1046   NA 142 12/23/2016 0951   K 3.7 05/29/2020 1057   K 4.6 02/13/2017 1046   K 3.9 12/23/2016 0951   CL 104 05/29/2020 1057   CL 108 02/13/2017 1046   CO2 28 05/29/2020 1057   CO2 28 02/13/2017 1046   CO2 25 12/23/2016 0951   BUN 17 05/29/2020 1057   BUN 22 02/13/2017 1046   BUN 15.0 12/23/2016 0951   CREATININE 1.10 (H) 05/29/2020 1057   CREATININE  1.1 02/13/2017 1046   CREATININE 0.9 12/23/2016 0951      Component Value Date/Time   CALCIUM 9.7 05/29/2020 1057   CALCIUM 9.7 02/13/2017 1046   CALCIUM 9.5 12/23/2016 0951   ALKPHOS 27 (L) 05/29/2020 1057   ALKPHOS 60 02/13/2017 1046   ALKPHOS 69 12/23/2016 0951   AST 85 (H) 05/29/2020 1057   AST 26 12/23/2016 0951   ALT 56 (H) 05/29/2020 1057   ALT 18 02/13/2017 1046   ALT 16 12/23/2016 0951   BILITOT 1.1 05/29/2020 1057   BILITOT 0.96 12/23/2016 0951       Impression and Plan: Rachael Davenport is a very pleasant 75yo African American female withanemia of chronic renal insufficiency and erythropoietin deficiency. She received Retacrit today for Hgb 10.1.  Iron studies are pending. We will replace if needed.  Lab and injection in  3 weeks and follow-up in 6 weeks.  She can contact our office with any questions or concerns.   Laverna Peace, NP 3/11/202211:48 AM

## 2020-05-29 NOTE — Telephone Encounter (Signed)
appts made and printed/mailed to pt per her req per 05/29/20 los   [employer]

## 2020-06-01 LAB — IRON AND TIBC
Iron: 115 ug/dL (ref 41–142)
Saturation Ratios: 43 % (ref 21–57)
TIBC: 270 ug/dL (ref 236–444)
UIBC: 155 ug/dL (ref 120–384)

## 2020-06-01 LAB — FERRITIN: Ferritin: 1029 ng/mL — ABNORMAL HIGH (ref 11–307)

## 2020-06-19 ENCOUNTER — Inpatient Hospital Stay: Payer: Medicare Other | Attending: Family

## 2020-06-19 ENCOUNTER — Other Ambulatory Visit: Payer: Self-pay

## 2020-06-19 ENCOUNTER — Telehealth: Payer: Self-pay

## 2020-06-19 ENCOUNTER — Inpatient Hospital Stay: Payer: Medicare Other

## 2020-06-19 DIAGNOSIS — I129 Hypertensive chronic kidney disease with stage 1 through stage 4 chronic kidney disease, or unspecified chronic kidney disease: Secondary | ICD-10-CM | POA: Insufficient documentation

## 2020-06-19 DIAGNOSIS — Z79899 Other long term (current) drug therapy: Secondary | ICD-10-CM | POA: Diagnosis not present

## 2020-06-19 DIAGNOSIS — D631 Anemia in chronic kidney disease: Secondary | ICD-10-CM | POA: Insufficient documentation

## 2020-06-19 DIAGNOSIS — N189 Chronic kidney disease, unspecified: Secondary | ICD-10-CM | POA: Insufficient documentation

## 2020-06-19 LAB — CBC WITH DIFFERENTIAL (CANCER CENTER ONLY)
Abs Immature Granulocytes: 0.01 10*3/uL (ref 0.00–0.07)
Basophils Absolute: 0 10*3/uL (ref 0.0–0.1)
Basophils Relative: 1 %
Eosinophils Absolute: 0.1 10*3/uL (ref 0.0–0.5)
Eosinophils Relative: 3 %
HCT: 37.7 % (ref 36.0–46.0)
Hemoglobin: 11.7 g/dL — ABNORMAL LOW (ref 12.0–15.0)
Immature Granulocytes: 0 %
Lymphocytes Relative: 26 %
Lymphs Abs: 0.9 10*3/uL (ref 0.7–4.0)
MCH: 31.5 pg (ref 26.0–34.0)
MCHC: 31 g/dL (ref 30.0–36.0)
MCV: 101.3 fL — ABNORMAL HIGH (ref 80.0–100.0)
Monocytes Absolute: 0.4 10*3/uL (ref 0.1–1.0)
Monocytes Relative: 12 %
Neutro Abs: 2.1 10*3/uL (ref 1.7–7.7)
Neutrophils Relative %: 58 %
Platelet Count: 173 10*3/uL (ref 150–400)
RBC: 3.72 MIL/uL — ABNORMAL LOW (ref 3.87–5.11)
RDW: 15 % (ref 11.5–15.5)
WBC Count: 3.5 10*3/uL — ABNORMAL LOW (ref 4.0–10.5)
nRBC: 0 % (ref 0.0–0.2)

## 2020-06-19 LAB — CMP (CANCER CENTER ONLY)
ALT: 42 U/L (ref 0–44)
AST: 57 U/L — ABNORMAL HIGH (ref 15–41)
Albumin: 4.1 g/dL (ref 3.5–5.0)
Alkaline Phosphatase: 28 U/L — ABNORMAL LOW (ref 38–126)
Anion gap: 6 (ref 5–15)
BUN: 21 mg/dL (ref 8–23)
CO2: 29 mmol/L (ref 22–32)
Calcium: 10.2 mg/dL (ref 8.9–10.3)
Chloride: 106 mmol/L (ref 98–111)
Creatinine: 1.2 mg/dL — ABNORMAL HIGH (ref 0.44–1.00)
GFR, Estimated: 47 mL/min — ABNORMAL LOW (ref >60)
Glucose, Bld: 97 mg/dL (ref 70–99)
Potassium: 4.3 mmol/L (ref 3.5–5.1)
Sodium: 141 mmol/L (ref 135–145)
Total Bilirubin: 1.1 mg/dL (ref 0.3–1.2)
Total Protein: 7.1 g/dL (ref 6.5–8.1)

## 2020-06-19 NOTE — Telephone Encounter (Signed)
Patient scheduled for retacrit today if HGB less than 11, HGB is 11.7 no retacrit needed today. Informed patient in lobby.

## 2020-07-10 ENCOUNTER — Ambulatory Visit: Payer: Medicare Other

## 2020-07-10 ENCOUNTER — Ambulatory Visit: Payer: Medicare Other | Admitting: Family

## 2020-07-10 ENCOUNTER — Other Ambulatory Visit: Payer: Medicare Other

## 2020-07-13 ENCOUNTER — Telehealth: Payer: Self-pay

## 2020-07-13 ENCOUNTER — Inpatient Hospital Stay: Payer: Medicare Other

## 2020-07-13 ENCOUNTER — Other Ambulatory Visit: Payer: Self-pay

## 2020-07-13 VITALS — BP 133/50 | HR 62 | Temp 98.3°F | Resp 18

## 2020-07-13 DIAGNOSIS — D631 Anemia in chronic kidney disease: Secondary | ICD-10-CM

## 2020-07-13 DIAGNOSIS — D500 Iron deficiency anemia secondary to blood loss (chronic): Secondary | ICD-10-CM

## 2020-07-13 DIAGNOSIS — I129 Hypertensive chronic kidney disease with stage 1 through stage 4 chronic kidney disease, or unspecified chronic kidney disease: Secondary | ICD-10-CM | POA: Diagnosis not present

## 2020-07-13 LAB — CBC WITH DIFFERENTIAL (CANCER CENTER ONLY)
Abs Immature Granulocytes: 0.01 10*3/uL (ref 0.00–0.07)
Basophils Absolute: 0 10*3/uL (ref 0.0–0.1)
Basophils Relative: 1 %
Eosinophils Absolute: 0.2 10*3/uL (ref 0.0–0.5)
Eosinophils Relative: 5 %
HCT: 30.5 % — ABNORMAL LOW (ref 36.0–46.0)
Hemoglobin: 9.7 g/dL — ABNORMAL LOW (ref 12.0–15.0)
Immature Granulocytes: 0 %
Lymphocytes Relative: 36 %
Lymphs Abs: 1.4 10*3/uL (ref 0.7–4.0)
MCH: 31.8 pg (ref 26.0–34.0)
MCHC: 31.8 g/dL (ref 30.0–36.0)
MCV: 100 fL (ref 80.0–100.0)
Monocytes Absolute: 0.4 10*3/uL (ref 0.1–1.0)
Monocytes Relative: 9 %
Neutro Abs: 1.9 10*3/uL (ref 1.7–7.7)
Neutrophils Relative %: 49 %
Platelet Count: 163 10*3/uL (ref 150–400)
RBC: 3.05 MIL/uL — ABNORMAL LOW (ref 3.87–5.11)
RDW: 13.7 % (ref 11.5–15.5)
WBC Count: 3.9 10*3/uL — ABNORMAL LOW (ref 4.0–10.5)
nRBC: 0 % (ref 0.0–0.2)

## 2020-07-13 LAB — CMP (CANCER CENTER ONLY)
ALT: 19 U/L (ref 0–44)
AST: 26 U/L (ref 15–41)
Albumin: 3.8 g/dL (ref 3.5–5.0)
Alkaline Phosphatase: 27 U/L — ABNORMAL LOW (ref 38–126)
Anion gap: 6 (ref 5–15)
BUN: 27 mg/dL — ABNORMAL HIGH (ref 8–23)
CO2: 29 mmol/L (ref 22–32)
Calcium: 10 mg/dL (ref 8.9–10.3)
Chloride: 105 mmol/L (ref 98–111)
Creatinine: 1.12 mg/dL — ABNORMAL HIGH (ref 0.44–1.00)
GFR, Estimated: 51 mL/min — ABNORMAL LOW (ref >60)
Glucose, Bld: 86 mg/dL (ref 70–99)
Potassium: 4.2 mmol/L (ref 3.5–5.1)
Sodium: 140 mmol/L (ref 135–145)
Total Bilirubin: 0.7 mg/dL (ref 0.3–1.2)
Total Protein: 6.8 g/dL (ref 6.5–8.1)

## 2020-07-13 MED ORDER — EPOETIN ALFA-EPBX 40000 UNIT/ML IJ SOLN
40000.0000 [IU] | Freq: Once | INTRAMUSCULAR | Status: AC
  Administered 2020-07-13: 40000 [IU] via SUBCUTANEOUS

## 2020-07-13 MED ORDER — EPOETIN ALFA-EPBX 40000 UNIT/ML IJ SOLN
INTRAMUSCULAR | Status: AC
  Filled 2020-07-13: qty 1

## 2020-07-13 NOTE — Telephone Encounter (Signed)
Pt came in thinking her lab and visit with Judson Roch was today.  Her appts in the system were r.s from 4/22 to [id number] but pt was certain of them being 4/25.  Ok per SArah to just do lab and inj today and see her back 5/25.  Pt is ok with this plan and a calendar was printed for her as well    Amonda Brillhart

## 2020-07-13 NOTE — Patient Instructions (Signed)

## 2020-08-12 ENCOUNTER — Telehealth: Payer: Self-pay

## 2020-08-12 ENCOUNTER — Other Ambulatory Visit: Payer: Self-pay

## 2020-08-12 ENCOUNTER — Inpatient Hospital Stay: Payer: Medicare Other

## 2020-08-12 ENCOUNTER — Encounter: Payer: Self-pay | Admitting: Family

## 2020-08-12 ENCOUNTER — Inpatient Hospital Stay (HOSPITAL_BASED_OUTPATIENT_CLINIC_OR_DEPARTMENT_OTHER): Payer: Medicare Other | Admitting: Family

## 2020-08-12 ENCOUNTER — Inpatient Hospital Stay: Payer: Medicare Other | Attending: Family

## 2020-08-12 VITALS — BP 146/51 | HR 66 | Temp 97.6°F | Resp 16 | Ht 62.0 in | Wt 103.1 lb

## 2020-08-12 DIAGNOSIS — D500 Iron deficiency anemia secondary to blood loss (chronic): Secondary | ICD-10-CM

## 2020-08-12 DIAGNOSIS — Z79899 Other long term (current) drug therapy: Secondary | ICD-10-CM | POA: Insufficient documentation

## 2020-08-12 DIAGNOSIS — D631 Anemia in chronic kidney disease: Secondary | ICD-10-CM

## 2020-08-12 DIAGNOSIS — R5383 Other fatigue: Secondary | ICD-10-CM | POA: Insufficient documentation

## 2020-08-12 DIAGNOSIS — Z880 Allergy status to penicillin: Secondary | ICD-10-CM | POA: Diagnosis not present

## 2020-08-12 DIAGNOSIS — N189 Chronic kidney disease, unspecified: Secondary | ICD-10-CM | POA: Insufficient documentation

## 2020-08-12 DIAGNOSIS — D508 Other iron deficiency anemias: Secondary | ICD-10-CM | POA: Diagnosis not present

## 2020-08-12 DIAGNOSIS — I129 Hypertensive chronic kidney disease with stage 1 through stage 4 chronic kidney disease, or unspecified chronic kidney disease: Secondary | ICD-10-CM | POA: Diagnosis not present

## 2020-08-12 LAB — CBC WITH DIFFERENTIAL (CANCER CENTER ONLY)
Abs Immature Granulocytes: 0.01 10*3/uL (ref 0.00–0.07)
Basophils Absolute: 0.1 10*3/uL (ref 0.0–0.1)
Basophils Relative: 1 %
Eosinophils Absolute: 0.2 10*3/uL (ref 0.0–0.5)
Eosinophils Relative: 4 %
HCT: 31.3 % — ABNORMAL LOW (ref 36.0–46.0)
Hemoglobin: 10.3 g/dL — ABNORMAL LOW (ref 12.0–15.0)
Immature Granulocytes: 0 %
Lymphocytes Relative: 39 %
Lymphs Abs: 1.5 10*3/uL (ref 0.7–4.0)
MCH: 32.4 pg (ref 26.0–34.0)
MCHC: 32.9 g/dL (ref 30.0–36.0)
MCV: 98.4 fL (ref 80.0–100.0)
Monocytes Absolute: 0.4 10*3/uL (ref 0.1–1.0)
Monocytes Relative: 9 %
Neutro Abs: 1.8 10*3/uL (ref 1.7–7.7)
Neutrophils Relative %: 47 %
Platelet Count: 133 10*3/uL — ABNORMAL LOW (ref 150–400)
RBC: 3.18 MIL/uL — ABNORMAL LOW (ref 3.87–5.11)
RDW: 13.5 % (ref 11.5–15.5)
WBC Count: 3.9 10*3/uL — ABNORMAL LOW (ref 4.0–10.5)
nRBC: 0 % (ref 0.0–0.2)

## 2020-08-12 LAB — IRON AND TIBC
Iron: 83 ug/dL (ref 41–142)
Saturation Ratios: 32 % (ref 21–57)
TIBC: 256 ug/dL (ref 236–444)
UIBC: 173 ug/dL (ref 120–384)

## 2020-08-12 LAB — CMP (CANCER CENTER ONLY)
ALT: 15 U/L (ref 0–44)
AST: 22 U/L (ref 15–41)
Albumin: 4 g/dL (ref 3.5–5.0)
Alkaline Phosphatase: 39 U/L (ref 38–126)
Anion gap: 5 (ref 5–15)
BUN: 22 mg/dL (ref 8–23)
CO2: 30 mmol/L (ref 22–32)
Calcium: 10 mg/dL (ref 8.9–10.3)
Chloride: 106 mmol/L (ref 98–111)
Creatinine: 0.92 mg/dL (ref 0.44–1.00)
GFR, Estimated: >60 mL/min (ref >60)
Glucose, Bld: 91 mg/dL (ref 70–99)
Potassium: 4 mmol/L (ref 3.5–5.1)
Sodium: 141 mmol/L (ref 135–145)
Total Bilirubin: 0.7 mg/dL (ref 0.3–1.2)
Total Protein: 6.7 g/dL (ref 6.5–8.1)

## 2020-08-12 LAB — RETICULOCYTES
Immature Retic Fract: 8.2 % (ref 2.3–15.9)
RBC.: 3.24 MIL/uL — ABNORMAL LOW (ref 3.87–5.11)
Retic Count, Absolute: 39.2 10*3/uL (ref 19.0–186.0)
Retic Ct Pct: 1.2 % (ref 0.4–3.1)

## 2020-08-12 LAB — FERRITIN: Ferritin: 614 ng/mL — ABNORMAL HIGH (ref 11–307)

## 2020-08-12 MED ORDER — EPOETIN ALFA-EPBX 40000 UNIT/ML IJ SOLN
INTRAMUSCULAR | Status: AC
  Filled 2020-08-12: qty 1

## 2020-08-12 MED ORDER — EPOETIN ALFA-EPBX 40000 UNIT/ML IJ SOLN
40000.0000 [IU] | Freq: Once | INTRAMUSCULAR | Status: AC
  Administered 2020-08-12: 40000 [IU] via SUBCUTANEOUS

## 2020-08-12 NOTE — Telephone Encounter (Signed)
appts made per 08/12/20 los and avs printed for pt  Rachael Davenport

## 2020-08-12 NOTE — Progress Notes (Signed)
Hematology and Oncology Follow Up Visit  Rachael Davenport [id number] [birth year] 75 y.o. 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 She is doing well and has no complaints at this time.  She has fatigue at times and will rest as needed.  No blood loss noted. No bruising or petechiae.  Hgb is 10.3, MCV 98 and platelets 133. WBC count 3.9.  No issue with infections. No fever, chills, n/v, cough, rash, dizziness, SOB, chest pain, palpitations, abdominal pain or changes in bowel or bladder habits.  She has had some mild swelling in her lower extremities and she states that her PCP has referred her to vascular for further evaluation.  No numbness or tignling in her extremities.  No falls or syncope to report.  She has maintained a good appetite and is staying well hydrated. Her weight is stable at 103 lbs.   ECOG Performance Status: 1 - Symptomatic but completely ambulatory  Medications:  Allergies as of 08/12/2020      Reactions   Penicillins Itching, Swelling   Has patient had a PCN reaction causing immediate rash, facial/tongue/throat swelling, SOB or lightheadedness with hypotension: Yes Has patient had a PCN reaction causing severe rash involving mucus membranes or skin necrosis: No Has patient had a PCN reaction that required hospitalization: No Has patient had a PCN reaction occurring within the last 10 years: No If all of the above answers are "NO", then may proceed with Cephalosporin use.   Valsartan Itching, Other (See Comments)   Heart sped up      Medication List       Accurate as of Aug 12, 2020  8:49 AM. If you have any questions, ask your nurse or doctor.        aspirin EC 81 MG tablet Take 81 mg by mouth daily.   benzonatate 100 MG capsule Commonly known as:  TESSALON Take 1 capsule (100 mg total) by mouth every 8 (eight) hours.   carvedilol 3.125 MG tablet Commonly known as: COREG Take 3.125 mg by mouth 2 (two) times daily.   docusate sodium 250 MG capsule Commonly known as: COLACE Take 1 capsule (250 mg total) by mouth daily.   Estradiol 4 MCG Inst Imvexxy Maintenance Pack 4 mcg vaginal insert  Insert 1 vaginal insert twice a week by vaginal route.   Fenofibric Acid 135 MG Cpdr fenofibric acid (choline) 135 mg capsule,delayed release   furosemide 20 MG tablet Commonly known as: LASIX Take by mouth.   ipratropium 0.06 % nasal spray Commonly known as: ATROVENT Place into the nose.   Restasis 0.05 % ophthalmic emulsion Generic drug: cycloSPORINE INT 1 GTT IN OU BID UTD   SUMAtriptan 25 MG tablet Commonly known as: IMITREX Take 25 mg by mouth once as needed for migraine (MAY REPEAT ONCE IN 2 HOURS IF NO RELIEF (MAX 2 TABLETS/24 HOURS)).   Vitamin D (Ergocalciferol) 1.25 MG (50000 UNIT) Caps capsule Commonly known as: DRISDOL Take 50,000 Units by mouth every 7 (seven) days.       Allergies:  Allergies  Allergen Reactions  . Penicillins Itching and Swelling    Has patient had a PCN reaction causing immediate rash, facial/tongue/throat swelling, SOB or lightheadedness with hypotension: Yes Has patient had a PCN reaction causing severe rash involving mucus membranes or skin necrosis: No Has patient had a  PCN reaction that required hospitalization: No Has patient had a PCN reaction occurring within the last 10 years: No If all of the above answers are "NO", then may proceed with Cephalosporin use.   . Valsartan Itching and Other (See Comments)    Heart sped up    Past Medical History, Surgical history, Social history, and Family History were reviewed and updated.  Review of Systems: All other 10 point review of systems is negative.   Physical Exam:  vitals were not taken for this visit.   Wt Readings from Last 3  Encounters:  05/29/20 101 lb 12.8 oz (46.2 kg)  05/14/20 104 lb (47.2 kg)  04/14/20 114 lb (51.7 kg)    Ocular: Sclerae unicteric, pupils equal, round and reactive to light Ear-nose-throat: Oropharynx clear, dentition fair Lymphatic: No cervical or supraclavicular adenopathy Lungs no rales or rhonchi, good excursion bilaterally Heart regular rate and rhythm, no murmur appreciated Abd soft, nontender, positive bowel sounds MSK no focal spinal tenderness, no joint edema Neuro: non-focal, well-oriented, appropriate affect Breasts: Deferred   Lab Results  Component Value Date   WBC 3.9 (L) 08/12/2020   HGB 10.3 (L) 08/12/2020   HCT 31.3 (L) 08/12/2020   MCV 98.4 08/12/2020   PLT 133 (L) 08/12/2020   Lab Results  Component Value Date   FERRITIN 1,029 (H) 05/29/2020   IRON 115 05/29/2020   TIBC 270 05/29/2020   UIBC 155 05/29/2020   IRONPCTSAT 43 05/29/2020   Lab Results  Component Value Date   RETICCTPCT 1.2 08/12/2020   RBC 3.24 (L) 08/12/2020   No results found for: KPAFRELGTCHN, LAMBDASER, KAPLAMBRATIO No results found for: IGGSERUM, IGA, IGMSERUM No results found for: Kathrynn Ducking, MSPIKE, SPEI   Chemistry      Component Value Date/Time   NA 140 07/13/2020 0858   NA 145 02/13/2017 1046   NA 142 12/23/2016 0951   K 4.2 07/13/2020 0858   K 4.6 02/13/2017 1046   K 3.9 12/23/2016 0951   CL 105 07/13/2020 0858   CL 108 02/13/2017 1046   CO2 29 07/13/2020 0858   CO2 28 02/13/2017 1046   CO2 25 12/23/2016 0951   BUN 27 (H) 07/13/2020 0858   BUN 22 02/13/2017 1046   BUN 15.0 12/23/2016 0951   CREATININE 1.12 (H) 07/13/2020 0858   CREATININE 1.1 02/13/2017 1046   CREATININE 0.9 12/23/2016 0951      Component Value Date/Time   CALCIUM 10.0 07/13/2020 0858   CALCIUM 9.7 02/13/2017 1046   CALCIUM 9.5 12/23/2016 0951   ALKPHOS 27 (L) 07/13/2020 0858   ALKPHOS 60 02/13/2017 1046   ALKPHOS 69 12/23/2016 0951   AST 26  07/13/2020 0858   AST 26 12/23/2016 0951   ALT 19 07/13/2020 0858   ALT 18 02/13/2017 1046   ALT 16 12/23/2016 0951   BILITOT 0.7 07/13/2020 0858   BILITOT 0.96 12/23/2016 0951       Impression and Plan: Rachael Davenport is a very pleasant 75yo African American female withanemia of chronic renal insufficiency and erythropoietin deficiency. Iron studies are pending. We will replace if needed.  She received ESA today for Hgb 10.3.  Lab and injection in 3 weeks with follow-up in 6 weeks.  She was encouraged to contact our office with any questions or concerns.   Laverna Peace, NP 5/25/20228:49 AM

## 2020-08-12 NOTE — Patient Instructions (Signed)

## 2020-09-02 ENCOUNTER — Other Ambulatory Visit: Payer: Self-pay

## 2020-09-02 ENCOUNTER — Inpatient Hospital Stay: Payer: Medicare Other

## 2020-09-02 ENCOUNTER — Inpatient Hospital Stay: Payer: Medicare Other | Attending: Family

## 2020-09-02 DIAGNOSIS — D631 Anemia in chronic kidney disease: Secondary | ICD-10-CM | POA: Insufficient documentation

## 2020-09-02 LAB — CMP (CANCER CENTER ONLY)
ALT: 11 U/L (ref 0–44)
AST: 19 U/L (ref 15–41)
Albumin: 4.1 g/dL (ref 3.5–5.0)
Alkaline Phosphatase: 50 U/L (ref 38–126)
Anion gap: 5 (ref 5–15)
BUN: 15 mg/dL (ref 8–23)
CO2: 29 mmol/L (ref 22–32)
Calcium: 10.1 mg/dL (ref 8.9–10.3)
Chloride: 105 mmol/L (ref 98–111)
Creatinine: 0.82 mg/dL (ref 0.44–1.00)
GFR, Estimated: >60 mL/min (ref >60)
Glucose, Bld: 92 mg/dL (ref 70–99)
Potassium: 3.8 mmol/L (ref 3.5–5.1)
Sodium: 139 mmol/L (ref 135–145)
Total Bilirubin: 1.5 mg/dL — ABNORMAL HIGH (ref 0.3–1.2)
Total Protein: 6.5 g/dL (ref 6.5–8.1)

## 2020-09-02 LAB — CBC WITH DIFFERENTIAL (CANCER CENTER ONLY)
Abs Immature Granulocytes: 0.01 10*3/uL (ref 0.00–0.07)
Basophils Absolute: 0 10*3/uL (ref 0.0–0.1)
Basophils Relative: 1 %
Eosinophils Absolute: 0.1 10*3/uL (ref 0.0–0.5)
Eosinophils Relative: 3 %
HCT: 34.3 % — ABNORMAL LOW (ref 36.0–46.0)
Hemoglobin: 11.3 g/dL — ABNORMAL LOW (ref 12.0–15.0)
Immature Granulocytes: 0 %
Lymphocytes Relative: 37 %
Lymphs Abs: 1.3 10*3/uL (ref 0.7–4.0)
MCH: 32.8 pg (ref 26.0–34.0)
MCHC: 32.9 g/dL (ref 30.0–36.0)
MCV: 99.4 fL (ref 80.0–100.0)
Monocytes Absolute: 0.4 10*3/uL (ref 0.1–1.0)
Monocytes Relative: 13 %
Neutro Abs: 1.5 10*3/uL — ABNORMAL LOW (ref 1.7–7.7)
Neutrophils Relative %: 46 %
Platelet Count: 123 10*3/uL — ABNORMAL LOW (ref 150–400)
RBC: 3.45 MIL/uL — ABNORMAL LOW (ref 3.87–5.11)
RDW: 13.5 % (ref 11.5–15.5)
WBC Count: 3.4 10*3/uL — ABNORMAL LOW (ref 4.0–10.5)
nRBC: 0 % (ref 0.0–0.2)

## 2020-09-18 ENCOUNTER — Telehealth: Payer: Self-pay | Admitting: *Deleted

## 2020-09-18 NOTE — Telephone Encounter (Signed)
Called patient about appointment being rescheduled from 09/23/20 to 09/24/20 - patient confirmed

## 2020-09-23 ENCOUNTER — Telehealth: Payer: Self-pay | Admitting: *Deleted

## 2020-09-23 ENCOUNTER — Inpatient Hospital Stay: Payer: Medicare Other

## 2020-09-23 ENCOUNTER — Inpatient Hospital Stay: Payer: Medicare Other | Admitting: Family

## 2020-09-23 NOTE — Telephone Encounter (Signed)
Called and lvm of appointment being rescheduled from 09/24/20 to 10/08/20 - request call back to confirm.

## 2020-09-24 ENCOUNTER — Ambulatory Visit: Payer: Medicare Other | Admitting: Family

## 2020-09-24 ENCOUNTER — Other Ambulatory Visit: Payer: Medicare Other

## 2020-09-24 ENCOUNTER — Ambulatory Visit: Payer: Medicare Other

## 2020-10-08 ENCOUNTER — Inpatient Hospital Stay: Payer: Medicare Other

## 2020-10-08 ENCOUNTER — Inpatient Hospital Stay: Payer: Medicare Other | Attending: Family

## 2020-10-08 ENCOUNTER — Telehealth: Payer: Self-pay | Admitting: *Deleted

## 2020-10-08 ENCOUNTER — Inpatient Hospital Stay: Payer: Medicare Other | Admitting: Family

## 2020-10-08 ENCOUNTER — Other Ambulatory Visit: Payer: Self-pay

## 2020-10-08 ENCOUNTER — Encounter: Payer: Self-pay | Admitting: Family

## 2020-10-08 VITALS — BP 121/51 | HR 84 | Temp 98.0°F | Resp 19 | Ht 62.0 in | Wt 105.0 lb

## 2020-10-08 DIAGNOSIS — N189 Chronic kidney disease, unspecified: Secondary | ICD-10-CM | POA: Insufficient documentation

## 2020-10-08 DIAGNOSIS — M25471 Effusion, right ankle: Secondary | ICD-10-CM | POA: Insufficient documentation

## 2020-10-08 DIAGNOSIS — D508 Other iron deficiency anemias: Secondary | ICD-10-CM

## 2020-10-08 DIAGNOSIS — Z880 Allergy status to penicillin: Secondary | ICD-10-CM | POA: Diagnosis not present

## 2020-10-08 DIAGNOSIS — R5383 Other fatigue: Secondary | ICD-10-CM | POA: Diagnosis not present

## 2020-10-08 DIAGNOSIS — M25472 Effusion, left ankle: Secondary | ICD-10-CM | POA: Diagnosis not present

## 2020-10-08 DIAGNOSIS — Z79899 Other long term (current) drug therapy: Secondary | ICD-10-CM | POA: Insufficient documentation

## 2020-10-08 DIAGNOSIS — D631 Anemia in chronic kidney disease: Secondary | ICD-10-CM | POA: Insufficient documentation

## 2020-10-08 DIAGNOSIS — D500 Iron deficiency anemia secondary to blood loss (chronic): Secondary | ICD-10-CM

## 2020-10-08 LAB — RETICULOCYTES
Immature Retic Fract: 11.7 % (ref 2.3–15.9)
RBC.: 3.27 MIL/uL — ABNORMAL LOW (ref 3.87–5.11)
Retic Count, Absolute: 56.9 10*3/uL (ref 19.0–186.0)
Retic Ct Pct: 1.7 % (ref 0.4–3.1)

## 2020-10-08 LAB — CMP (CANCER CENTER ONLY)
ALT: 10 U/L (ref 0–44)
AST: 19 U/L (ref 15–41)
Albumin: 4 g/dL (ref 3.5–5.0)
Alkaline Phosphatase: 52 U/L (ref 38–126)
Anion gap: 5 (ref 5–15)
BUN: 22 mg/dL (ref 8–23)
CO2: 30 mmol/L (ref 22–32)
Calcium: 10.4 mg/dL — ABNORMAL HIGH (ref 8.9–10.3)
Chloride: 105 mmol/L (ref 98–111)
Creatinine: 0.97 mg/dL (ref 0.44–1.00)
GFR, Estimated: >60 mL/min (ref >60)
Glucose, Bld: 93 mg/dL (ref 70–99)
Potassium: 4.8 mmol/L (ref 3.5–5.1)
Sodium: 140 mmol/L (ref 135–145)
Total Bilirubin: 0.9 mg/dL (ref 0.3–1.2)
Total Protein: 7 g/dL (ref 6.5–8.1)

## 2020-10-08 LAB — CBC WITH DIFFERENTIAL (CANCER CENTER ONLY)
Abs Immature Granulocytes: 0.03 10*3/uL (ref 0.00–0.07)
Basophils Absolute: 0 10*3/uL (ref 0.0–0.1)
Basophils Relative: 1 %
Eosinophils Absolute: 0.2 10*3/uL (ref 0.0–0.5)
Eosinophils Relative: 4 %
HCT: 32.3 % — ABNORMAL LOW (ref 36.0–46.0)
Hemoglobin: 10.5 g/dL — ABNORMAL LOW (ref 12.0–15.0)
Immature Granulocytes: 1 %
Lymphocytes Relative: 31 %
Lymphs Abs: 1.3 10*3/uL (ref 0.7–4.0)
MCH: 32.5 pg (ref 26.0–34.0)
MCHC: 32.5 g/dL (ref 30.0–36.0)
MCV: 100 fL (ref 80.0–100.0)
Monocytes Absolute: 0.4 10*3/uL (ref 0.1–1.0)
Monocytes Relative: 10 %
Neutro Abs: 2.3 10*3/uL (ref 1.7–7.7)
Neutrophils Relative %: 53 %
Platelet Count: 127 10*3/uL — ABNORMAL LOW (ref 150–400)
RBC: 3.23 MIL/uL — ABNORMAL LOW (ref 3.87–5.11)
RDW: 12.7 % (ref 11.5–15.5)
WBC Count: 4.3 10*3/uL (ref 4.0–10.5)
nRBC: 0 % (ref 0.0–0.2)

## 2020-10-08 MED ORDER — EPOETIN ALFA-EPBX 40000 UNIT/ML IJ SOLN
INTRAMUSCULAR | Status: AC
  Filled 2020-10-08: qty 1

## 2020-10-08 MED ORDER — EPOETIN ALFA-EPBX 40000 UNIT/ML IJ SOLN
40000.0000 [IU] | Freq: Once | INTRAMUSCULAR | Status: AC
  Administered 2020-10-08: 40000 [IU] via SUBCUTANEOUS

## 2020-10-08 NOTE — Progress Notes (Signed)
Hematology and Oncology Follow Up Visit  Rachael Davenport [id number] [birth year] 75 y.o. 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 She is doing well but notes some mild fatigue at times.  No blood loss noted. No bruising or petechiae.  No fever, chills, n/v, cough, rash, dizziness, SOB, chest pain, palpitations, abdominal pain or changes in bowel or bladder habits.  No tenderness, numbness or tingling in her extremities.  She has slight swelling in her feet and ankles (more in the right) that comes and goes. She states that her vascular specialist said her return flow was a little slower in the right lower extremity but no intervention was needed.  No falls or syncope.  She has maintained a good appetite and is staying well hydrated. Her weight is stable at 105 lbs.   ECOG Performance Status: 1 - Symptomatic but completely ambulatory  Medications:  Allergies as of 10/08/2020       Reactions   Penicillins Itching, Swelling   Has patient had a PCN reaction causing immediate rash, facial/tongue/throat swelling, SOB or lightheadedness with hypotension: Yes Has patient had a PCN reaction causing severe rash involving mucus membranes or skin necrosis: No Has patient had a PCN reaction that required hospitalization: No Has patient had a PCN reaction occurring within the last 10 years: No If all of the above answers are "NO", then may proceed with Cephalosporin use.   Valsartan Itching, Other (See Comments)   Heart sped up        Medication List        Accurate as of October 08, 2020 11:01 AM. If you have any questions, ask your nurse or doctor.          STOP taking these medications    benzonatate 100 MG capsule Commonly known as: TESSALON Stopped by: Laverna Peace, NP   Fenofibric Acid 135 MG Cpdr Stopped by: Laverna Peace, NP   ipratropium 0.06 % nasal spray Commonly known as: ATROVENT Stopped by: Laverna Peace, NP       TAKE these medications    aspirin EC 81 MG tablet Take 81 mg by mouth daily.   carvedilol 3.125 MG tablet Commonly known as: COREG Take 3.125 mg by mouth 2 (two) times daily.   docusate sodium 250 MG capsule Commonly known as: COLACE Take 1 capsule (250 mg total) by mouth daily.   Estradiol 4 MCG Inst Imvexxy Maintenance Pack 4 mcg vaginal insert  Insert 1 vaginal insert twice a week by vaginal route.   furosemide 20 MG tablet Commonly known as: LASIX Take by mouth.   Restasis 0.05 % ophthalmic emulsion Generic drug: cycloSPORINE INT 1 GTT IN OU BID UTD   SUMAtriptan 25 MG tablet Commonly known as: IMITREX Take 25 mg by mouth once as needed for migraine (MAY REPEAT ONCE IN 2 HOURS IF NO RELIEF (MAX 2 TABLETS/24 HOURS)).   Vitamin D (Ergocalciferol) 1.25 MG (50000 UNIT) Caps capsule Commonly known as: DRISDOL Take 50,000 Units by mouth every 7 (seven) days.        Allergies:  Allergies  Allergen Reactions   Penicillins Itching and Swelling    Has patient had a PCN reaction causing immediate rash, facial/tongue/throat swelling, SOB or lightheadedness with hypotension: Yes Has patient had a  PCN reaction causing severe rash involving mucus membranes or skin necrosis: No Has patient had a PCN reaction that required hospitalization: No Has patient had a PCN reaction occurring within the last 10 years: No If all of the above answers are "NO", then may proceed with Cephalosporin use.    Valsartan Itching and Other (See Comments)    Heart sped up    Past Medical History, Surgical history, Social history, and Family History were reviewed and updated.  Review of Systems: All other 10 point review of systems is negative.   Physical Exam:  height is '5\' 2"'$  (1.575 m) and weight is 105 lb  (47.6 kg). Her oral temperature is 98 F (36.7 C). Her blood pressure is 121/51 (abnormal) and her pulse is 84. Her respiration is 19 and oxygen saturation is 96%.   Wt Readings from Last 3 Encounters:  10/08/20 105 lb (47.6 kg)  08/12/20 103 lb 1.9 oz (46.8 kg)  05/29/20 101 lb 12.8 oz (46.2 kg)    Ocular: Sclerae unicteric, pupils equal, round and reactive to light Ear-nose-throat: Oropharynx clear, dentition fair Lymphatic: No cervical or supraclavicular adenopathy Lungs no rales or rhonchi, good excursion bilaterally Heart regular rate and rhythm, no murmur appreciated Abd soft, nontender, positive bowel sounds MSK no focal spinal tenderness, no joint edema Neuro: non-focal, well-oriented, appropriate affect Breasts: Deferred   Lab Results  Component Value Date   WBC 4.3 10/08/2020   HGB 10.5 (L) 10/08/2020   HCT 32.3 (L) 10/08/2020   MCV 100.0 10/08/2020   PLT 127 (L) 10/08/2020   Lab Results  Component Value Date   FERRITIN 614 (H) 08/12/2020   IRON 83 08/12/2020   TIBC 256 08/12/2020   UIBC 173 08/12/2020   IRONPCTSAT 32 08/12/2020   Lab Results  Component Value Date   RETICCTPCT 1.7 10/08/2020   RBC 3.27 (L) 10/08/2020   RBC 3.23 (L) 10/08/2020   No results found for: KPAFRELGTCHN, LAMBDASER, KAPLAMBRATIO No results found for: IGGSERUM, IGA, IGMSERUM No results found for: Odetta Pink, SPEI   Chemistry      Component Value Date/Time   NA 139 09/02/2020 1016   NA 145 02/13/2017 1046   NA 142 12/23/2016 0951   K 3.8 09/02/2020 1016   K 4.6 02/13/2017 1046   K 3.9 12/23/2016 0951   CL 105 09/02/2020 1016   CL 108 02/13/2017 1046   CO2 29 09/02/2020 1016   CO2 28 02/13/2017 1046   CO2 25 12/23/2016 0951   BUN 15 09/02/2020 1016   BUN 22 02/13/2017 1046   BUN 15.0 12/23/2016 0951   CREATININE 0.82 09/02/2020 1016   CREATININE 1.1 02/13/2017 1046   CREATININE 0.9 12/23/2016 0951      Component  Value Date/Time   CALCIUM 10.1 09/02/2020 1016   CALCIUM 9.7 02/13/2017 1046   CALCIUM 9.5 12/23/2016 0951   ALKPHOS 50 09/02/2020 1016   ALKPHOS 60 02/13/2017 1046   ALKPHOS 69 12/23/2016 0951   AST 19 09/02/2020 1016   AST 26 12/23/2016 0951   ALT 11 09/02/2020 1016   ALT 18 02/13/2017 1046   ALT 16 12/23/2016 0951   BILITOT 1.5 (H) 09/02/2020 1016   BILITOT 0.96 12/23/2016 0951       Impression and Plan: Rachael Davenport is a very pleasant 75 yo African American female with anemia of chronic renal insufficiency and erythropoietin deficiency. ESA given for Hgb 10.5.  Iron studies are pending. We can replace if  needed.  Lab and injection in 3 weeks and follow-up in 6 weeks.  She can contact our office with any questions or concerns.   Laverna Peace, NP 7/21/202211:01 AM

## 2020-10-08 NOTE — Patient Instructions (Signed)
Epoetin Alfa injection What is this medication? EPOETIN ALFA (e POE e tin AL fa) helps your body make more red blood cells. This medicine is used to treat anemia caused by chronic kidney disease, cancer chemotherapy, or HIV-therapy. It may also be used before surgery if you have anemia. This medicine may be used for other purposes; ask your health care provider or pharmacist if you have questions. COMMON BRAND NAME(S): Epogen, Procrit, Retacrit What should I tell my care team before I take this medication? They need to know if you have any of these conditions: cancer heart disease high blood pressure history of blood clots history of stroke low levels of folate, iron, or vitamin B12 in the blood seizures an unusual or allergic reaction to erythropoietin, albumin, benzyl alcohol, hamster proteins, other medicines, foods, dyes, or preservatives pregnant or trying to get pregnant breast-feeding How should I use this medication? This medicine is for injection into a vein or under the skin. It is usually given by a health care professional in a hospital or clinic setting. If you get this medicine at home, you will be taught how to prepare and give this medicine. Use exactly as directed. Take your medicine at regular intervals. Do not take your medicine more often than directed. It is important that you put your used needles and syringes in a special sharps container. Do not put them in a trash can. If you do not have a sharps container, call your pharmacist or healthcare provider to get one. A special MedGuide will be given to you by the pharmacist with each prescription and refill. Be sure to read this information carefully each time. Talk to your pediatrician regarding the use of this medicine in children. While this drug may be prescribed for selected conditions, precautions do apply. Overdosage: If you think you have taken too much of this medicine contact a poison control center or emergency  room at once. NOTE: This medicine is only for you. Do not share this medicine with others. What if I miss a dose? If you miss a dose, take it as soon as you can. If it is almost time for your next dose, take only that dose. Do not take double or extra doses. What may interact with this medication? Interactions have not been studied. This list may not describe all possible interactions. Give your health care provider a list of all the medicines, herbs, non-prescription drugs, or dietary supplements you use. Also tell them if you smoke, drink alcohol, or use illegal drugs. Some items may interact with your medicine. What should I watch for while using this medication? Your condition will be monitored carefully while you are receiving this medicine. You may need blood work done while you are taking this medicine. This medicine may cause a decrease in vitamin B6. You should make sure that you get enough vitamin B6 while you are taking this medicine. Discuss the foods you eat and the vitamins you take with your health care professional. What side effects may I notice from receiving this medication? Side effects that you should report to your doctor or health care professional as soon as possible: allergic reactions like skin rash, itching or hives, swelling of the face, lips, or tongue seizures signs and symptoms of a blood clot such as breathing problems; changes in vision; chest pain; severe, sudden headache; pain, swelling, warmth in the leg; trouble speaking; sudden numbness or weakness of the face, arm or leg signs and symptoms of a stroke like   changes in vision; confusion; trouble speaking or understanding; severe headaches; sudden numbness or weakness of the face, arm or leg; trouble walking; dizziness; loss of balance or coordination Side effects that usually do not require medical attention (report to your doctor or health care professional if they continue or are  bothersome): chills cough dizziness fever headaches joint pain muscle cramps muscle pain nausea, vomiting pain, redness, or irritation at site where injected This list may not describe all possible side effects. Call your doctor for medical advice about side effects. You may report side effects to FDA at 1-800-FDA-1088. Where should I keep my medication? Keep out of the reach of children. Store in a refrigerator between 2 and 8 degrees C (36 and 46 degrees F). Do not freeze or shake. Throw away any unused portion if using a single-dose vial. Multi-dose vials can be kept in the refrigerator for up to 21 days after the initial dose. Throw away unused medicine. NOTE: This sheet is a summary. It may not cover all possible information. If you have questions about this medicine, talk to your doctor, pharmacist, or health care provider.  2022 Elsevier/Gold Standard (2016-10-14 08:35:19)  

## 2020-10-08 NOTE — Telephone Encounter (Signed)
Per 10/08/20 los - called patient and gave upcomming appointments - patient confirmed - mailed calendar

## 2020-10-09 LAB — IRON AND TIBC
Iron: 93 ug/dL (ref 28–170)
Saturation Ratios: 32 % — ABNORMAL HIGH (ref 10.4–31.8)
TIBC: 289 ug/dL (ref 250–450)
UIBC: 196 ug/dL

## 2020-10-09 LAB — FERRITIN: Ferritin: 459 ng/mL — ABNORMAL HIGH (ref 11–307)

## 2020-10-29 ENCOUNTER — Other Ambulatory Visit: Payer: Self-pay

## 2020-10-29 ENCOUNTER — Inpatient Hospital Stay: Payer: Medicare Other

## 2020-10-29 ENCOUNTER — Inpatient Hospital Stay: Payer: Medicare Other | Attending: Family

## 2020-10-29 DIAGNOSIS — D500 Iron deficiency anemia secondary to blood loss (chronic): Secondary | ICD-10-CM | POA: Diagnosis present

## 2020-10-29 DIAGNOSIS — D631 Anemia in chronic kidney disease: Secondary | ICD-10-CM

## 2020-10-29 LAB — CBC WITH DIFFERENTIAL (CANCER CENTER ONLY)
Abs Immature Granulocytes: 0.01 10*3/uL (ref 0.00–0.07)
Basophils Absolute: 0.1 10*3/uL (ref 0.0–0.1)
Basophils Relative: 1 %
Eosinophils Absolute: 0.1 10*3/uL (ref 0.0–0.5)
Eosinophils Relative: 2 %
HCT: 34.4 % — ABNORMAL LOW (ref 36.0–46.0)
Hemoglobin: 11.1 g/dL — ABNORMAL LOW (ref 12.0–15.0)
Immature Granulocytes: 0 %
Lymphocytes Relative: 26 %
Lymphs Abs: 1 10*3/uL (ref 0.7–4.0)
MCH: 32.7 pg (ref 26.0–34.0)
MCHC: 32.3 g/dL (ref 30.0–36.0)
MCV: 101.5 fL — ABNORMAL HIGH (ref 80.0–100.0)
Monocytes Absolute: 0.4 10*3/uL (ref 0.1–1.0)
Monocytes Relative: 10 %
Neutro Abs: 2.3 10*3/uL (ref 1.7–7.7)
Neutrophils Relative %: 61 %
Platelet Count: 119 10*3/uL — ABNORMAL LOW (ref 150–400)
RBC: 3.39 MIL/uL — ABNORMAL LOW (ref 3.87–5.11)
RDW: 13.2 % (ref 11.5–15.5)
WBC Count: 3.9 10*3/uL — ABNORMAL LOW (ref 4.0–10.5)
nRBC: 0 % (ref 0.0–0.2)

## 2020-10-29 LAB — CMP (CANCER CENTER ONLY)
ALT: 12 U/L (ref 0–44)
AST: 22 U/L (ref 15–41)
Albumin: 4 g/dL (ref 3.5–5.0)
Alkaline Phosphatase: 66 U/L (ref 38–126)
Anion gap: 6 (ref 5–15)
BUN: 23 mg/dL (ref 8–23)
CO2: 29 mmol/L (ref 22–32)
Calcium: 9.7 mg/dL (ref 8.9–10.3)
Chloride: 104 mmol/L (ref 98–111)
Creatinine: 0.97 mg/dL (ref 0.44–1.00)
GFR, Estimated: >60 mL/min (ref >60)
Glucose, Bld: 101 mg/dL — ABNORMAL HIGH (ref 70–99)
Potassium: 3.9 mmol/L (ref 3.5–5.1)
Sodium: 139 mmol/L (ref 135–145)
Total Bilirubin: 1 mg/dL (ref 0.3–1.2)
Total Protein: 6.6 g/dL (ref 6.5–8.1)

## 2020-11-19 ENCOUNTER — Inpatient Hospital Stay: Payer: Medicare Other

## 2020-11-19 ENCOUNTER — Encounter: Payer: Self-pay | Admitting: Hematology & Oncology

## 2020-11-19 ENCOUNTER — Other Ambulatory Visit: Payer: Self-pay

## 2020-11-19 ENCOUNTER — Telehealth: Payer: Self-pay

## 2020-11-19 ENCOUNTER — Inpatient Hospital Stay: Payer: Medicare Other | Attending: Family

## 2020-11-19 ENCOUNTER — Inpatient Hospital Stay: Payer: Medicare Other | Admitting: Hematology & Oncology

## 2020-11-19 VITALS — BP 152/59 | HR 63 | Temp 98.4°F | Resp 20 | Wt 108.1 lb

## 2020-11-19 DIAGNOSIS — D500 Iron deficiency anemia secondary to blood loss (chronic): Secondary | ICD-10-CM | POA: Insufficient documentation

## 2020-11-19 DIAGNOSIS — Z79899 Other long term (current) drug therapy: Secondary | ICD-10-CM | POA: Insufficient documentation

## 2020-11-19 DIAGNOSIS — N189 Chronic kidney disease, unspecified: Secondary | ICD-10-CM | POA: Diagnosis not present

## 2020-11-19 DIAGNOSIS — R600 Localized edema: Secondary | ICD-10-CM | POA: Insufficient documentation

## 2020-11-19 DIAGNOSIS — D631 Anemia in chronic kidney disease: Secondary | ICD-10-CM | POA: Insufficient documentation

## 2020-11-19 DIAGNOSIS — I129 Hypertensive chronic kidney disease with stage 1 through stage 4 chronic kidney disease, or unspecified chronic kidney disease: Secondary | ICD-10-CM | POA: Insufficient documentation

## 2020-11-19 LAB — CBC WITH DIFFERENTIAL (CANCER CENTER ONLY)
Abs Immature Granulocytes: 0.04 10*3/uL (ref 0.00–0.07)
Basophils Absolute: 0.1 10*3/uL (ref 0.0–0.1)
Basophils Relative: 1 %
Eosinophils Absolute: 0.2 10*3/uL (ref 0.0–0.5)
Eosinophils Relative: 4 %
HCT: 35 % — ABNORMAL LOW (ref 36.0–46.0)
Hemoglobin: 11.5 g/dL — ABNORMAL LOW (ref 12.0–15.0)
Immature Granulocytes: 1 %
Lymphocytes Relative: 33 %
Lymphs Abs: 1.6 10*3/uL (ref 0.7–4.0)
MCH: 32.4 pg (ref 26.0–34.0)
MCHC: 32.9 g/dL (ref 30.0–36.0)
MCV: 98.6 fL (ref 80.0–100.0)
Monocytes Absolute: 0.5 10*3/uL (ref 0.1–1.0)
Monocytes Relative: 10 %
Neutro Abs: 2.4 10*3/uL (ref 1.7–7.7)
Neutrophils Relative %: 51 %
Platelet Count: 163 10*3/uL (ref 150–400)
RBC: 3.55 MIL/uL — ABNORMAL LOW (ref 3.87–5.11)
RDW: 12.4 % (ref 11.5–15.5)
WBC Count: 4.8 10*3/uL (ref 4.0–10.5)
nRBC: 0 % (ref 0.0–0.2)

## 2020-11-19 LAB — RETICULOCYTES
Immature Retic Fract: 7.5 % (ref 2.3–15.9)
RBC.: 3.54 MIL/uL — ABNORMAL LOW (ref 3.87–5.11)
Retic Count, Absolute: 47.1 10*3/uL (ref 19.0–186.0)
Retic Ct Pct: 1.3 % (ref 0.4–3.1)

## 2020-11-19 LAB — CMP (CANCER CENTER ONLY)
ALT: 13 U/L (ref 0–44)
AST: 21 U/L (ref 15–41)
Albumin: 4.3 g/dL (ref 3.5–5.0)
Alkaline Phosphatase: 70 U/L (ref 38–126)
Anion gap: 6 (ref 5–15)
BUN: 20 mg/dL (ref 8–23)
CO2: 30 mmol/L (ref 22–32)
Calcium: 10 mg/dL (ref 8.9–10.3)
Chloride: 105 mmol/L (ref 98–111)
Creatinine: 0.89 mg/dL (ref 0.44–1.00)
GFR, Estimated: >60 mL/min (ref >60)
Glucose, Bld: 102 mg/dL — ABNORMAL HIGH (ref 70–99)
Potassium: 4 mmol/L (ref 3.5–5.1)
Sodium: 141 mmol/L (ref 135–145)
Total Bilirubin: 1.1 mg/dL (ref 0.3–1.2)
Total Protein: 7.4 g/dL (ref 6.5–8.1)

## 2020-11-19 LAB — FERRITIN: Ferritin: 524 ng/mL — ABNORMAL HIGH (ref 11–307)

## 2020-11-19 LAB — IRON AND TIBC
Iron: 117 ug/dL (ref 41–142)
Saturation Ratios: 43 % (ref 21–57)
TIBC: 269 ug/dL (ref 236–444)
UIBC: 152 ug/dL (ref 120–384)

## 2020-11-19 NOTE — Progress Notes (Signed)
Hematology and Oncology Follow Up Visit  Rachael Davenport [id number] November 10, 1945 75 y.o. 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  She is doing quite well.  She really has had no complaints.  She will be going down to her hometown over the [place] weekend.  When we last saw her back in July, her ferritin was 459 with an iron saturation of 32%.  She is responding fairly well to the Retacrit.  I think we can move her appointments out a little bit so that it will be a little easier for her.  She has had no problems with COVID.  There is no cough or shortness of breath.  She has had no rashes.  She does have little edema in her legs.  She has not been taking her diuretics.  She has had no issues with nausea or vomiting.  There is no change in bowel or bladder habits.  Overall, I would say her performance status is ECOG 1.    Medications:  Allergies as of 11/19/2020       Reactions   Penicillins Itching, Swelling   Has patient had a PCN reaction causing immediate rash, facial/tongue/throat swelling, SOB or lightheadedness with hypotension: Yes Has patient had a PCN reaction causing severe rash involving mucus membranes or skin necrosis: No Has patient had a PCN reaction that required hospitalization: No Has patient had a PCN reaction occurring within the last 10 years: No If all of the above answers are "NO", then may proceed with Cephalosporin use.   Valsartan Itching, Other (See Comments)   Heart sped up        Medication List        Accurate as of November 19, 2020  9:54 AM. If you have any questions, ask your nurse or doctor.          amLODipine 5 MG tablet Commonly known as: NORVASC Take 5 mg by mouth daily.   aspirin EC 81 MG tablet Take 81 mg by mouth daily.    carvedilol 3.125 MG tablet Commonly known as: COREG Take 3.125 mg by mouth 2 (two) times daily.   docusate sodium 250 MG capsule Commonly known as: COLACE Take 1 capsule (250 mg total) by mouth daily.   Estradiol 4 MCG Inst Imvexxy Maintenance Pack 4 mcg vaginal insert  Insert 1 vaginal insert twice a week by vaginal route.   furosemide 20 MG tablet Commonly known as: LASIX Take by mouth.   pravastatin 10 MG tablet Commonly known as: PRAVACHOL pravastatin 10 mg tablet   Restasis 0.05 % ophthalmic emulsion Generic drug: cycloSPORINE INT 1 GTT IN OU BID UTD   SUMAtriptan 25 MG tablet Commonly known as: IMITREX Take 25 mg by mouth once as needed for migraine (MAY REPEAT ONCE IN 2 HOURS IF NO RELIEF (MAX 2 TABLETS/24 HOURS)).   Vitamin D (Ergocalciferol) 1.25 MG (50000 UNIT) Caps capsule Commonly known as: DRISDOL Take 50,000 Units by mouth every 7 (seven) days.        Allergies:  Allergies  Allergen Reactions   Penicillins Itching and Swelling    Has patient had a PCN reaction causing immediate rash, facial/tongue/throat swelling, SOB or lightheadedness with hypotension: Yes Has patient had a PCN reaction causing severe rash involving mucus membranes  or skin necrosis: No Has patient had a PCN reaction that required hospitalization: No Has patient had a PCN reaction occurring within the last 10 years: No If all of the above answers are "NO", then may proceed with Cephalosporin use.    Valsartan Itching and Other (See Comments)    Heart sped up    Past Medical History, Surgical history, Social history, and Family History were reviewed and updated.  Review of Systems: All other 10 point review of systems is negative.   Physical Exam:  weight is 108 lb 1.9 oz (49 kg). Her oral temperature is 98.4 F (36.9 C). Her blood pressure is 152/59 (abnormal) and her pulse is 63. Her respiration is 20 and oxygen saturation is 100%.   Wt Readings from Last 3 Encounters:   11/19/20 108 lb 1.9 oz (49 kg)  10/08/20 105 lb (47.6 kg)  08/12/20 103 lb 1.9 oz (46.8 kg)    Ocular: Sclerae unicteric, pupils equal, round and reactive to light Ear-nose-throat: Oropharynx clear, dentition fair Lymphatic: No cervical or supraclavicular adenopathy Lungs no rales or rhonchi, good excursion bilaterally Heart regular rate and rhythm, no murmur appreciated Abd soft, nontender, positive bowel sounds MSK no focal spinal tenderness, no joint edema Neuro: non-focal, well-oriented, appropriate affect Breasts: Deferred   Lab Results  Component Value Date   WBC 4.8 11/19/2020   HGB 11.5 (L) 11/19/2020   HCT 35.0 (L) 11/19/2020   MCV 98.6 11/19/2020   PLT 163 11/19/2020   Lab Results  Component Value Date   FERRITIN 459 (H) 10/08/2020   IRON 93 10/08/2020   TIBC 289 10/08/2020   UIBC 196 10/08/2020   IRONPCTSAT 32 (H) 10/08/2020   Lab Results  Component Value Date   RETICCTPCT 1.3 11/19/2020   RBC 3.54 (L) 11/19/2020   RBC 3.55 (L) 11/19/2020   No results found for: KPAFRELGTCHN, LAMBDASER, KAPLAMBRATIO No results found for: IGGSERUM, IGA, IGMSERUM No results found for: Kathrynn Ducking, MSPIKE, SPEI   Chemistry      Component Value Date/Time   NA 141 11/19/2020 0915   NA 145 02/13/2017 1046   NA 142 12/23/2016 0951   K 4.0 11/19/2020 0915   K 4.6 02/13/2017 1046   K 3.9 12/23/2016 0951   CL 105 11/19/2020 0915   CL 108 02/13/2017 1046   CO2 30 11/19/2020 0915   CO2 28 02/13/2017 1046   CO2 25 12/23/2016 0951   BUN 20 11/19/2020 0915   BUN 22 02/13/2017 1046   BUN 15.0 12/23/2016 0951   CREATININE 0.89 11/19/2020 0915   CREATININE 1.1 02/13/2017 1046   CREATININE 0.9 12/23/2016 0951      Component Value Date/Time   CALCIUM 10.0 11/19/2020 0915   CALCIUM 9.7 02/13/2017 1046   CALCIUM 9.5 12/23/2016 0951   ALKPHOS 70 11/19/2020 0915   ALKPHOS 60 02/13/2017 1046   ALKPHOS 69 12/23/2016 0951   AST 21  11/19/2020 0915   AST 26 12/23/2016 0951   ALT 13 11/19/2020 0915   ALT 18 02/13/2017 1046   ALT 16 12/23/2016 0951   BILITOT 1.1 11/19/2020 0915   BILITOT 0.96 12/23/2016 0951       Impression and Plan: Rachael Davenport is a very pleasant 75 yo African American female with anemia of chronic renal insufficiency and erythropoietin deficiency.  Her hemoglobin has come up quite nicely.  She does not need any Retacrit today.  I think we just plan to get her back in 6  weeks.  I do not think she needs to come back any earlier unless she has problems.  I feel confident that her blood count will be maintained.     Volanda Napoleon, MD 9/1/20229:54 AM

## 2020-11-19 NOTE — Telephone Encounter (Signed)
Appts made and printed for pt per 11/19/20 los  Lindsay Soulliere 

## 2021-01-04 ENCOUNTER — Ambulatory Visit: Payer: Medicare Other | Admitting: Family

## 2021-01-04 ENCOUNTER — Other Ambulatory Visit: Payer: Medicare Other

## 2021-01-05 ENCOUNTER — Inpatient Hospital Stay: Payer: Medicare Other | Admitting: Family

## 2021-01-05 ENCOUNTER — Inpatient Hospital Stay: Payer: Medicare Other | Attending: Family

## 2021-01-05 ENCOUNTER — Other Ambulatory Visit: Payer: Self-pay

## 2021-01-05 ENCOUNTER — Encounter: Payer: Self-pay | Admitting: Family

## 2021-01-05 ENCOUNTER — Inpatient Hospital Stay: Payer: Medicare Other

## 2021-01-05 VITALS — BP 165/50 | HR 73 | Temp 98.7°F | Resp 17 | Wt 113.0 lb

## 2021-01-05 DIAGNOSIS — Z79899 Other long term (current) drug therapy: Secondary | ICD-10-CM | POA: Insufficient documentation

## 2021-01-05 DIAGNOSIS — D500 Iron deficiency anemia secondary to blood loss (chronic): Secondary | ICD-10-CM | POA: Diagnosis not present

## 2021-01-05 DIAGNOSIS — N183 Chronic kidney disease, stage 3 unspecified: Secondary | ICD-10-CM | POA: Insufficient documentation

## 2021-01-05 DIAGNOSIS — D508 Other iron deficiency anemias: Secondary | ICD-10-CM

## 2021-01-05 DIAGNOSIS — I129 Hypertensive chronic kidney disease with stage 1 through stage 4 chronic kidney disease, or unspecified chronic kidney disease: Secondary | ICD-10-CM | POA: Insufficient documentation

## 2021-01-05 DIAGNOSIS — D631 Anemia in chronic kidney disease: Secondary | ICD-10-CM | POA: Diagnosis not present

## 2021-01-05 LAB — CBC WITH DIFFERENTIAL (CANCER CENTER ONLY)
Abs Immature Granulocytes: 0.01 10*3/uL (ref 0.00–0.07)
Basophils Absolute: 0 10*3/uL (ref 0.0–0.1)
Basophils Relative: 1 %
Eosinophils Absolute: 0.1 10*3/uL (ref 0.0–0.5)
Eosinophils Relative: 2 %
HCT: 31.9 % — ABNORMAL LOW (ref 36.0–46.0)
Hemoglobin: 10.5 g/dL — ABNORMAL LOW (ref 12.0–15.0)
Immature Granulocytes: 0 %
Lymphocytes Relative: 21 %
Lymphs Abs: 1.2 10*3/uL (ref 0.7–4.0)
MCH: 32.9 pg (ref 26.0–34.0)
MCHC: 32.9 g/dL (ref 30.0–36.0)
MCV: 100 fL (ref 80.0–100.0)
Monocytes Absolute: 0.5 10*3/uL (ref 0.1–1.0)
Monocytes Relative: 9 %
Neutro Abs: 3.9 10*3/uL (ref 1.7–7.7)
Neutrophils Relative %: 67 %
Platelet Count: 142 10*3/uL — ABNORMAL LOW (ref 150–400)
RBC: 3.19 MIL/uL — ABNORMAL LOW (ref 3.87–5.11)
RDW: 12.6 % (ref 11.5–15.5)
WBC Count: 5.7 10*3/uL (ref 4.0–10.5)
nRBC: 0 % (ref 0.0–0.2)

## 2021-01-05 LAB — CMP (CANCER CENTER ONLY)
ALT: 11 U/L (ref 0–44)
AST: 17 U/L (ref 15–41)
Albumin: 4.2 g/dL (ref 3.5–5.0)
Alkaline Phosphatase: 64 U/L (ref 38–126)
Anion gap: 7 (ref 5–15)
BUN: 18 mg/dL (ref 8–23)
CO2: 29 mmol/L (ref 22–32)
Calcium: 10.2 mg/dL (ref 8.9–10.3)
Chloride: 105 mmol/L (ref 98–111)
Creatinine: 0.88 mg/dL (ref 0.44–1.00)
GFR, Estimated: >60 mL/min (ref >60)
Glucose, Bld: 145 mg/dL — ABNORMAL HIGH (ref 70–99)
Potassium: 4 mmol/L (ref 3.5–5.1)
Sodium: 141 mmol/L (ref 135–145)
Total Bilirubin: 1.1 mg/dL (ref 0.3–1.2)
Total Protein: 7.3 g/dL (ref 6.5–8.1)

## 2021-01-05 LAB — RETICULOCYTES
Immature Retic Fract: 9.6 % (ref 2.3–15.9)
RBC.: 3.11 MIL/uL — ABNORMAL LOW (ref 3.87–5.11)
Retic Count, Absolute: 74.3 10*3/uL (ref 19.0–186.0)
Retic Ct Pct: 2.4 % (ref 0.4–3.1)

## 2021-01-05 MED ORDER — FAMOTIDINE IN NACL 20-0.9 MG/50ML-% IV SOLN: 20.0000 mg | Freq: Once | INTRAVENOUS | Status: DC | PRN

## 2021-01-05 MED ORDER — HEPARIN SOD (PORK) LOCK FLUSH 100 UNIT/ML IV SOLN: 500.0000 [IU] | Freq: Once | INTRAVENOUS | Status: DC | PRN

## 2021-01-05 MED ORDER — DIPHENHYDRAMINE HCL 50 MG/ML IJ SOLN: 50.0000 mg | Freq: Once | INTRAMUSCULAR | Status: DC | PRN

## 2021-01-05 MED ORDER — EPINEPHRINE 0.3 MG/0.3ML IJ SOAJ: 0.3000 mg | Freq: Once | INTRAMUSCULAR | Status: DC | PRN

## 2021-01-05 MED ORDER — ALTEPLASE 2 MG IJ SOLR: 2.0000 mg | Freq: Once | INTRAMUSCULAR | Status: DC | PRN

## 2021-01-05 MED ORDER — SODIUM CHLORIDE 0.9 % IV SOLN: Freq: Once | INTRAVENOUS | Status: DC

## 2021-01-05 MED ORDER — ALBUTEROL SULFATE (2.5 MG/3ML) 0.083% IN NEBU: 2.5000 mg | INHALATION_SOLUTION | Freq: Once | RESPIRATORY_TRACT | Status: DC | PRN

## 2021-01-05 MED ORDER — SODIUM CHLORIDE 0.9% FLUSH: 3.0000 mL | Freq: Once | INTRAVENOUS | Status: DC | PRN

## 2021-01-05 MED ORDER — HEPARIN SOD (PORK) LOCK FLUSH 100 UNIT/ML IV SOLN: 250.0000 [IU] | Freq: Once | INTRAVENOUS | Status: DC | PRN

## 2021-01-05 MED ORDER — METHYLPREDNISOLONE SODIUM SUCC 125 MG IJ SOLR: 125.0000 mg | Freq: Once | INTRAMUSCULAR | Status: DC | PRN

## 2021-01-05 MED ORDER — SODIUM CHLORIDE 0.9 % IV SOLN: Freq: Once | INTRAVENOUS | Status: DC | PRN

## 2021-01-05 MED ORDER — SODIUM CHLORIDE 0.9% FLUSH: 10.0000 mL | Freq: Once | INTRAVENOUS | Status: DC | PRN

## 2021-01-05 MED ORDER — EPOETIN ALFA-EPBX 40000 UNIT/ML IJ SOLN
40000.0000 [IU] | Freq: Once | INTRAMUSCULAR | Status: AC
  Administered 2021-01-05: 40000 [IU] via SUBCUTANEOUS
  Filled 2021-01-05: qty 1

## 2021-01-05 NOTE — Patient Instructions (Signed)
Epoetin Alfa injection What is this medication? EPOETIN ALFA (e POE e tin AL fa) helps your body make more red blood cells. This medicine is used to treat anemia caused by chronic kidney disease, cancer chemotherapy, or HIV-therapy. It may also be used before surgery if you have anemia. This medicine may be used for other purposes; ask your health care provider or pharmacist if you have questions. COMMON BRAND NAME(S): Epogen, Procrit, Retacrit What should I tell my care team before I take this medication? They need to know if you have any of these conditions: cancer heart disease high blood pressure history of blood clots history of stroke low levels of folate, iron, or vitamin B12 in the blood seizures an unusual or allergic reaction to erythropoietin, albumin, benzyl alcohol, hamster proteins, other medicines, foods, dyes, or preservatives pregnant or trying to get pregnant breast-feeding How should I use this medication? This medicine is for injection into a vein or under the skin. It is usually given by a health care professional in a hospital or clinic setting. If you get this medicine at home, you will be taught how to prepare and give this medicine. Use exactly as directed. Take your medicine at regular intervals. Do not take your medicine more often than directed. It is important that you put your used needles and syringes in a special sharps container. Do not put them in a trash can. If you do not have a sharps container, call your pharmacist or healthcare provider to get one. A special MedGuide will be given to you by the pharmacist with each prescription and refill. Be sure to read this information carefully each time. Talk to your pediatrician regarding the use of this medicine in children. While this drug may be prescribed for selected conditions, precautions do apply. Overdosage: If you think you have taken too much of this medicine contact a poison control center or emergency  room at once. NOTE: This medicine is only for you. Do not share this medicine with others. What if I miss a dose? If you miss a dose, take it as soon as you can. If it is almost time for your next dose, take only that dose. Do not take double or extra doses. What may interact with this medication? Interactions have not been studied. This list may not describe all possible interactions. Give your health care provider a list of all the medicines, herbs, non-prescription drugs, or dietary supplements you use. Also tell them if you smoke, drink alcohol, or use illegal drugs. Some items may interact with your medicine. What should I watch for while using this medication? Your condition will be monitored carefully while you are receiving this medicine. You may need blood work done while you are taking this medicine. This medicine may cause a decrease in vitamin B6. You should make sure that you get enough vitamin B6 while you are taking this medicine. Discuss the foods you eat and the vitamins you take with your health care professional. What side effects may I notice from receiving this medication? Side effects that you should report to your doctor or health care professional as soon as possible: allergic reactions like skin rash, itching or hives, swelling of the face, lips, or tongue seizures signs and symptoms of a blood clot such as breathing problems; changes in vision; chest pain; severe, sudden headache; pain, swelling, warmth in the leg; trouble speaking; sudden numbness or weakness of the face, arm or leg signs and symptoms of a stroke like   changes in vision; confusion; trouble speaking or understanding; severe headaches; sudden numbness or weakness of the face, arm or leg; trouble walking; dizziness; loss of balance or coordination Side effects that usually do not require medical attention (report to your doctor or health care professional if they continue or are  bothersome): chills cough dizziness fever headaches joint pain muscle cramps muscle pain nausea, vomiting pain, redness, or irritation at site where injected This list may not describe all possible side effects. Call your doctor for medical advice about side effects. You may report side effects to FDA at 1-800-FDA-1088. Where should I keep my medication? Keep out of the reach of children. Store in a refrigerator between 2 and 8 degrees C (36 and 46 degrees F). Do not freeze or shake. Throw away any unused portion if using a single-dose vial. Multi-dose vials can be kept in the refrigerator for up to 21 days after the initial dose. Throw away unused medicine. NOTE: This sheet is a summary. It may not cover all possible information. If you have questions about this medicine, talk to your doctor, pharmacist, or health care provider.  2022 Elsevier/Gold Standard (2016-10-14 08:35:19)  

## 2021-01-05 NOTE — Progress Notes (Signed)
Hematology and Oncology Follow Up Visit  Rachael Davenport [id number] 1945/07/03 75 y.o. 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 She is doing well and has no complaints at this time.  Hgb is 10.5, MCV 100, WBC count 5.7 and platelets 142.  No episodes of blood loss to report. No bruising or petechiae.  No fever, chills, n/v, cough, rash, dizziness, SOB, chest pain, palpitations, abdominal pain or changes in bowel or bladder habits.  No swelling or tenderness in her extremities.  She has had numbness and tingling in her fingertips for many years. No change from her baseline.  No falls or syncope to report.  She has maintained a good appetite and is staying well hydrated. Her weight is stable at 113 lbs.   ECOG Performance Status: 1 - Symptomatic but completely ambulatory  Medications:  Allergies as of 01/05/2021       Reactions   Penicillins Itching, Swelling   Has patient had a PCN reaction causing immediate rash, facial/tongue/throat swelling, SOB or lightheadedness with hypotension: Yes Has patient had a PCN reaction causing severe rash involving mucus membranes or skin necrosis: No Has patient had a PCN reaction that required hospitalization: No Has patient had a PCN reaction occurring within the last 10 years: No If all of the above answers are "NO", then may proceed with Cephalosporin use.   Valsartan Itching, Other (See Comments)   Heart sped up        Medication List        Accurate as of January 05, 2021 11:33 AM. If you have any questions, ask your nurse or doctor.          amLODipine 5 MG tablet Commonly known as: NORVASC Take 5 mg by mouth daily.   aspirin EC 81 MG tablet Take 81 mg by mouth daily.   carvedilol 3.125 MG  tablet Commonly known as: COREG Take 3.125 mg by mouth 2 (two) times daily.   docusate sodium 250 MG capsule Commonly known as: COLACE Take 1 capsule (250 mg total) by mouth daily.   Estradiol 4 MCG Inst Imvexxy Maintenance Pack 4 mcg vaginal insert  Insert 1 vaginal insert twice a week by vaginal route.   furosemide 20 MG tablet Commonly known as: LASIX Take by mouth.   pravastatin 10 MG tablet Commonly known as: PRAVACHOL pravastatin 10 mg tablet   Restasis 0.05 % ophthalmic emulsion Generic drug: cycloSPORINE INT 1 GTT IN OU BID UTD   SUMAtriptan 25 MG tablet Commonly known as: IMITREX Take 25 mg by mouth once as needed for migraine (MAY REPEAT ONCE IN 2 HOURS IF NO RELIEF (MAX 2 TABLETS/24 HOURS)).   Vitamin D (Ergocalciferol) 1.25 MG (50000 UNIT) Caps capsule Commonly known as: DRISDOL Take 50,000 Units by mouth every 7 (seven) days.        Allergies:  Allergies  Allergen Reactions   Penicillins Itching and Swelling    Has patient had a PCN reaction causing immediate rash, facial/tongue/throat swelling, SOB or lightheadedness with hypotension: Yes Has patient had a PCN reaction causing severe rash involving mucus membranes or skin necrosis: No Has patient had a PCN reaction that required hospitalization: No Has patient had a PCN reaction occurring within the last 10 years: No If all  of the above answers are "NO", then may proceed with Cephalosporin use.    Valsartan Itching and Other (See Comments)    Heart sped up    Past Medical History, Surgical history, Social history, and Family History were reviewed and updated.  Review of Systems: All other 10 point review of systems is negative.   Physical Exam:  weight is 113 lb (51.3 kg). Her oral temperature is 98.7 F (37.1 C). Her blood pressure is 165/50 (abnormal) and her pulse is 73. Her respiration is 17 and oxygen saturation is 100%.   Wt Readings from Last 3 Encounters:  01/05/21 113 lb (51.3 kg)   11/19/20 108 lb 1.9 oz (49 kg)  10/08/20 105 lb (47.6 kg)    Ocular: Sclerae unicteric, pupils equal, round and reactive to light Ear-nose-throat: Oropharynx clear, dentition fair Lymphatic: No cervical or supraclavicular adenopathy Lungs no rales or rhonchi, good excursion bilaterally Heart regular rate and rhythm, no murmur appreciated Abd soft, nontender, positive bowel sounds MSK no focal spinal tenderness, no joint edema Neuro: non-focal, well-oriented, appropriate affect Breasts: Deferred   Lab Results  Component Value Date   WBC 5.7 01/05/2021   HGB 10.5 (L) 01/05/2021   HCT 31.9 (L) 01/05/2021   MCV 100.0 01/05/2021   PLT 142 (L) 01/05/2021   Lab Results  Component Value Date   FERRITIN 524 (H) 11/19/2020   IRON 117 11/19/2020   TIBC 269 11/19/2020   UIBC 152 11/19/2020   IRONPCTSAT 43 11/19/2020   Lab Results  Component Value Date   RETICCTPCT 2.4 01/05/2021   RBC 3.19 (L) 01/05/2021   RBC 3.11 (L) 01/05/2021   No results found for: KPAFRELGTCHN, LAMBDASER, KAPLAMBRATIO No results found for: IGGSERUM, IGA, IGMSERUM No results found for: Kathrynn Ducking, MSPIKE, SPEI   Chemistry      Component Value Date/Time   NA 141 11/19/2020 0915   NA 145 02/13/2017 1046   NA 142 12/23/2016 0951   K 4.0 11/19/2020 0915   K 4.6 02/13/2017 1046   K 3.9 12/23/2016 0951   CL 105 11/19/2020 0915   CL 108 02/13/2017 1046   CO2 30 11/19/2020 0915   CO2 28 02/13/2017 1046   CO2 25 12/23/2016 0951   BUN 20 11/19/2020 0915   BUN 22 02/13/2017 1046   BUN 15.0 12/23/2016 0951   CREATININE 0.89 11/19/2020 0915   CREATININE 1.1 02/13/2017 1046   CREATININE 0.9 12/23/2016 0951      Component Value Date/Time   CALCIUM 10.0 11/19/2020 0915   CALCIUM 9.7 02/13/2017 1046   CALCIUM 9.5 12/23/2016 0951   ALKPHOS 70 11/19/2020 0915   ALKPHOS 60 02/13/2017 1046   ALKPHOS 69 12/23/2016 0951   AST 21 11/19/2020 0915   AST 26  12/23/2016 0951   ALT 13 11/19/2020 0915   ALT 18 02/13/2017 1046   ALT 16 12/23/2016 0951   BILITOT 1.1 11/19/2020 0915   BILITOT 0.96 12/23/2016 0951       Impression and Plan: Rachael Davenport is a very pleasant 75 yo African American female with anemia of chronic renal insufficiency and erythropoietin deficiency. ESA given today for Hgb 10.5.  Iron studies pending. We will replace if needed.  Follow-up in 6 weeks.  She can contact our office with any questions or concerns.   Lottie Dawson, NP 10/18/202211:33 AM

## 2021-01-06 LAB — IRON AND TIBC
Iron: 82 ug/dL (ref 41–142)
Saturation Ratios: 30 % (ref 21–57)
TIBC: 270 ug/dL (ref 236–444)
UIBC: 188 ug/dL (ref 120–384)

## 2021-01-06 LAB — FERRITIN: Ferritin: 622 ng/mL — ABNORMAL HIGH (ref 11–307)

## 2021-01-11 ENCOUNTER — Telehealth: Payer: Self-pay

## 2021-01-11 NOTE — Telephone Encounter (Signed)
Patient called requesting lab work be mailed to her and wants to know if they are okay.  Called patient back and LM with iron results and that we will mail her a copy.

## 2021-01-25 ENCOUNTER — Other Ambulatory Visit: Payer: Self-pay | Admitting: Obstetrics and Gynecology

## 2021-01-25 DIAGNOSIS — Z1231 Encounter for screening mammogram for malignant neoplasm of breast: Secondary | ICD-10-CM

## 2021-01-28 ENCOUNTER — Other Ambulatory Visit: Payer: Self-pay

## 2021-01-28 ENCOUNTER — Ambulatory Visit
Admission: RE | Admit: 2021-01-28 | Discharge: 2021-01-28 | Disposition: A | Discharge status: Home / Self Care | Payer: Medicare Other | Source: Ambulatory Visit | Attending: Obstetrics and Gynecology | Admitting: Obstetrics and Gynecology

## 2021-01-28 DIAGNOSIS — Z1231 Encounter for screening mammogram for malignant neoplasm of breast: Secondary | ICD-10-CM

## 2021-02-16 ENCOUNTER — Other Ambulatory Visit: Payer: Self-pay

## 2021-02-16 ENCOUNTER — Inpatient Hospital Stay: Payer: Medicare Other | Attending: Family | Admitting: Family

## 2021-02-16 ENCOUNTER — Inpatient Hospital Stay: Payer: Medicare Other

## 2021-02-16 ENCOUNTER — Telehealth: Payer: Self-pay | Admitting: *Deleted

## 2021-02-16 ENCOUNTER — Encounter: Payer: Self-pay | Admitting: Family

## 2021-02-16 VITALS — BP 153/51 | HR 61 | Temp 98.3°F | Resp 17 | Wt 110.0 lb

## 2021-02-16 DIAGNOSIS — D631 Anemia in chronic kidney disease: Secondary | ICD-10-CM | POA: Insufficient documentation

## 2021-02-16 DIAGNOSIS — N189 Chronic kidney disease, unspecified: Secondary | ICD-10-CM | POA: Insufficient documentation

## 2021-02-16 DIAGNOSIS — D509 Iron deficiency anemia, unspecified: Secondary | ICD-10-CM | POA: Diagnosis not present

## 2021-02-16 DIAGNOSIS — D500 Iron deficiency anemia secondary to blood loss (chronic): Secondary | ICD-10-CM

## 2021-02-16 DIAGNOSIS — D508 Other iron deficiency anemias: Secondary | ICD-10-CM

## 2021-02-16 LAB — CMP (CANCER CENTER ONLY)
ALT: 10 U/L (ref 0–44)
AST: 19 U/L (ref 15–41)
Albumin: 4.2 g/dL (ref 3.5–5.0)
Alkaline Phosphatase: 73 U/L (ref 38–126)
Anion gap: 7 (ref 5–15)
BUN: 18 mg/dL (ref 8–23)
CO2: 30 mmol/L (ref 22–32)
Calcium: 10.2 mg/dL (ref 8.9–10.3)
Chloride: 103 mmol/L (ref 98–111)
Creatinine: 0.9 mg/dL (ref 0.44–1.00)
GFR, Estimated: >60 mL/min (ref >60)
Glucose, Bld: 124 mg/dL — ABNORMAL HIGH (ref 70–99)
Potassium: 3.9 mmol/L (ref 3.5–5.1)
Sodium: 140 mmol/L (ref 135–145)
Total Bilirubin: 1 mg/dL (ref 0.3–1.2)
Total Protein: 7.3 g/dL (ref 6.5–8.1)

## 2021-02-16 LAB — RETICULOCYTES
Immature Retic Fract: 5.6 % (ref 2.3–15.9)
RBC.: 3.42 MIL/uL — ABNORMAL LOW (ref 3.87–5.11)
Retic Count, Absolute: 49.2 10*3/uL (ref 19.0–186.0)
Retic Ct Pct: 1.4 % (ref 0.4–3.1)

## 2021-02-16 LAB — CBC WITH DIFFERENTIAL (CANCER CENTER ONLY)
Abs Immature Granulocytes: 0 10*3/uL (ref 0.00–0.07)
Basophils Absolute: 0 10*3/uL (ref 0.0–0.1)
Basophils Relative: 1 %
Eosinophils Absolute: 0.1 10*3/uL (ref 0.0–0.5)
Eosinophils Relative: 3 %
HCT: 34.5 % — ABNORMAL LOW (ref 36.0–46.0)
Hemoglobin: 11.1 g/dL — ABNORMAL LOW (ref 12.0–15.0)
Immature Granulocytes: 0 %
Lymphocytes Relative: 31 %
Lymphs Abs: 1.1 10*3/uL (ref 0.7–4.0)
MCH: 32.2 pg (ref 26.0–34.0)
MCHC: 32.2 g/dL (ref 30.0–36.0)
MCV: 100 fL (ref 80.0–100.0)
Monocytes Absolute: 0.4 10*3/uL (ref 0.1–1.0)
Monocytes Relative: 11 %
Neutro Abs: 2 10*3/uL (ref 1.7–7.7)
Neutrophils Relative %: 54 %
Platelet Count: 131 10*3/uL — ABNORMAL LOW (ref 150–400)
RBC: 3.45 MIL/uL — ABNORMAL LOW (ref 3.87–5.11)
RDW: 12.3 % (ref 11.5–15.5)
WBC Count: 3.6 10*3/uL — ABNORMAL LOW (ref 4.0–10.5)
nRBC: 0 % (ref 0.0–0.2)

## 2021-02-16 LAB — IRON AND TIBC
Iron: 89 ug/dL (ref 28–170)
Saturation Ratios: 30 % (ref 10.4–31.8)
TIBC: 300 ug/dL (ref 250–450)
UIBC: 211 ug/dL

## 2021-02-16 LAB — FERRITIN: Ferritin: 375 ng/mL — ABNORMAL HIGH (ref 11–307)

## 2021-02-16 NOTE — Telephone Encounter (Signed)
Per 02/16/21 los - gave upcoming appointments - confirmed

## 2021-02-16 NOTE — Progress Notes (Signed)
Hematology and Oncology Follow Up Visit  Rachael Davenport [id number] 1945/04/06 75 y.o. 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 She is doing well but notes some mild fatigue at times and constipation.  She is taking Mirilax once a day but has not noted any improvement. She will try increasing this to 2-3 times daily and contact GI if she still does not note improvement.  She has occasional blood in her stool with hemorrhoids and straining with constipation.  No other blood loss noted. No bruising or petechiae.  No fever, chills, n/v, cough, rash, dizziness, SOB, chest pain, palpitations, abdominal pain/bloating or changes in bladder habits.  No swelling or tenderness in her extremities.  She has intermittent numbness and tingling in the right hands and plans to follow-up with her PCP regarding a neurology referral.  No falls or syncope to report.  She states that she is eating well and staying properly hydrated. Her weight is stable at 110 lbs.   ECOG Performance Status: 1 - Symptomatic but completely ambulatory  Medications:  Allergies as of 02/16/2021       Reactions   Penicillins Itching, Swelling   Has patient had a PCN reaction causing immediate rash, facial/tongue/throat swelling, SOB or lightheadedness with hypotension: Yes Has patient had a PCN reaction causing severe rash involving mucus membranes or skin necrosis: No Has patient had a PCN reaction that required hospitalization: No Has patient had a PCN reaction occurring within the last 10 years: No If all of the above answers are "NO", then may proceed with Cephalosporin use.   Valsartan Itching, Other (See Comments)   Heart sped up        Medication List         Accurate as of February 16, 2021 11:45 AM. If you have any questions, ask your nurse or doctor.          amLODipine 5 MG tablet Commonly known as: NORVASC Take 5 mg by mouth daily.   aspirin EC 81 MG tablet Take 81 mg by mouth daily.   carvedilol 3.125 MG tablet Commonly known as: COREG Take 3.125 mg by mouth 2 (two) times daily.   docusate sodium 250 MG capsule Commonly known as: COLACE Take 1 capsule (250 mg total) by mouth daily.   Estradiol 4 MCG Inst Imvexxy Maintenance Pack 4 mcg vaginal insert  Insert 1 vaginal insert twice a week by vaginal route.   furosemide 20 MG tablet Commonly known as: LASIX Take by mouth.   polyethylene glycol 17 g packet Commonly known as: MIRALAX / GLYCOLAX Take 17 g by mouth daily as needed.   pravastatin 10 MG tablet Commonly known as: PRAVACHOL pravastatin 10 mg tablet   Restasis 0.05 % ophthalmic emulsion Generic drug: cycloSPORINE INT 1 GTT IN OU BID UTD   SUMAtriptan 25 MG tablet Commonly known as: IMITREX Take 25 mg by mouth once as needed for migraine (MAY REPEAT ONCE IN 2 HOURS IF NO RELIEF (MAX 2 TABLETS/24 HOURS)).   Vitamin D (Ergocalciferol) 1.25 MG (50000 UNIT) Caps capsule Commonly known as: DRISDOL Take 50,000 Units by mouth every 7 (seven) days.        Allergies:  Allergies  Allergen Reactions  Penicillins Itching and Swelling    Has patient had a PCN reaction causing immediate rash, facial/tongue/throat swelling, SOB or lightheadedness with hypotension: Yes Has patient had a PCN reaction causing severe rash involving mucus membranes or skin necrosis: No Has patient had a PCN reaction that required hospitalization: No Has patient had a PCN reaction occurring within the last 10 years: No If all of the above answers are "NO", then may proceed with Cephalosporin use.    Valsartan Itching and Other (See Comments)    Heart sped up    Past Medical History, Surgical history, Social history, and  Family History were reviewed and updated.  Review of Systems: All other 10 point review of systems is negative.   Physical Exam:  weight is 110 lb 0.6 oz (49.9 kg). Her oral temperature is 98.3 F (36.8 C). Her blood pressure is 153/51 (abnormal) and her pulse is 61. Her respiration is 17 and oxygen saturation is 100%.   Wt Readings from Last 3 Encounters:  02/16/21 110 lb 0.6 oz (49.9 kg)  01/05/21 113 lb (51.3 kg)  11/19/20 108 lb 1.9 oz (49 kg)    Ocular: Sclerae unicteric, pupils equal, round and reactive to light Ear-nose-throat: Oropharynx clear, dentition fair Lymphatic: No cervical or supraclavicular adenopathy Lungs no rales or rhonchi, good excursion bilaterally Heart regular rate and rhythm, no murmur appreciated Abd soft, nontender, positive bowel sounds MSK no focal spinal tenderness, no joint edema Neuro: non-focal, well-oriented, appropriate affect Breasts: Deferred   Lab Results  Component Value Date   WBC 3.6 (L) 02/16/2021   HGB 11.1 (L) 02/16/2021   HCT 34.5 (L) 02/16/2021   MCV 100.0 02/16/2021   PLT 131 (L) 02/16/2021   Lab Results  Component Value Date   FERRITIN 622 (H) 01/05/2021   IRON 82 01/05/2021   TIBC 270 01/05/2021   UIBC 188 01/05/2021   IRONPCTSAT 30 01/05/2021   Lab Results  Component Value Date   RETICCTPCT 1.4 02/16/2021   RBC 3.45 (L) 02/16/2021   No results found for: KPAFRELGTCHN, LAMBDASER, KAPLAMBRATIO No results found for: IGGSERUM, IGA, IGMSERUM No results found for: Kathrynn Ducking, MSPIKE, SPEI   Chemistry      Component Value Date/Time   NA 141 01/05/2021 1110   NA 145 02/13/2017 1046   NA 142 12/23/2016 0951   K 4.0 01/05/2021 1110   K 4.6 02/13/2017 1046   K 3.9 12/23/2016 0951   CL 105 01/05/2021 1110   CL 108 02/13/2017 1046   CO2 29 01/05/2021 1110   CO2 28 02/13/2017 1046   CO2 25 12/23/2016 0951   BUN 18 01/05/2021 1110   BUN 22 02/13/2017 1046   BUN 15.0  12/23/2016 0951   CREATININE 0.88 01/05/2021 1110   CREATININE 1.1 02/13/2017 1046   CREATININE 0.9 12/23/2016 0951      Component Value Date/Time   CALCIUM 10.2 01/05/2021 1110   CALCIUM 9.7 02/13/2017 1046   CALCIUM 9.5 12/23/2016 0951   ALKPHOS 64 01/05/2021 1110   ALKPHOS 60 02/13/2017 1046   ALKPHOS 69 12/23/2016 0951   AST 17 01/05/2021 1110   AST 26 12/23/2016 0951   ALT 11 01/05/2021 1110   ALT 18 02/13/2017 1046   ALT 16 12/23/2016 0951   BILITOT 1.1 01/05/2021 1110   BILITOT 0.96 12/23/2016 0951       Impression and Plan: Rachael Davenport is a very pleasant 75 yo African American female with anemia of chronic renal insufficiency  and erythropoietin deficiency. No ESA needed this visit, Hgb 11.1.  Iron studies are pending.  Follow-up in 6 weeks.  She can contact our office with any questions or concerns.   Lottie Dawson, NP 11/29/202211:45 AM

## 2021-03-30 ENCOUNTER — Encounter: Payer: Self-pay | Admitting: Family

## 2021-03-30 ENCOUNTER — Telehealth: Payer: Self-pay | Admitting: *Deleted

## 2021-03-30 ENCOUNTER — Inpatient Hospital Stay: Payer: Medicare Other | Attending: Family

## 2021-03-30 ENCOUNTER — Other Ambulatory Visit: Payer: Self-pay

## 2021-03-30 ENCOUNTER — Inpatient Hospital Stay: Payer: Medicare Other

## 2021-03-30 ENCOUNTER — Inpatient Hospital Stay: Payer: Medicare Other | Admitting: Family

## 2021-03-30 VITALS — BP 134/49 | HR 50 | Temp 98.2°F | Resp 18 | Ht 62.0 in | Wt 111.4 lb

## 2021-03-30 DIAGNOSIS — N184 Chronic kidney disease, stage 4 (severe): Secondary | ICD-10-CM | POA: Insufficient documentation

## 2021-03-30 DIAGNOSIS — D631 Anemia in chronic kidney disease: Secondary | ICD-10-CM | POA: Diagnosis present

## 2021-03-30 DIAGNOSIS — D500 Iron deficiency anemia secondary to blood loss (chronic): Secondary | ICD-10-CM

## 2021-03-30 LAB — CMP (CANCER CENTER ONLY)
ALT: 10 U/L (ref 0–44)
AST: 18 U/L (ref 15–41)
Albumin: 4.1 g/dL (ref 3.5–5.0)
Alkaline Phosphatase: 63 U/L (ref 38–126)
Anion gap: 6 (ref 5–15)
BUN: 19 mg/dL (ref 8–23)
CO2: 30 mmol/L (ref 22–32)
Calcium: 10 mg/dL (ref 8.9–10.3)
Chloride: 104 mmol/L (ref 98–111)
Creatinine: 1.17 mg/dL — ABNORMAL HIGH (ref 0.44–1.00)
GFR, Estimated: 48 mL/min — ABNORMAL LOW (ref >60)
Glucose, Bld: 99 mg/dL (ref 70–99)
Potassium: 4.4 mmol/L (ref 3.5–5.1)
Sodium: 140 mmol/L (ref 135–145)
Total Bilirubin: 0.9 mg/dL (ref 0.3–1.2)
Total Protein: 6.6 g/dL (ref 6.5–8.1)

## 2021-03-30 LAB — CBC WITH DIFFERENTIAL (CANCER CENTER ONLY)
Abs Immature Granulocytes: 0.05 10*3/uL (ref 0.00–0.07)
Basophils Absolute: 0.1 10*3/uL (ref 0.0–0.1)
Basophils Relative: 1 %
Eosinophils Absolute: 0.1 10*3/uL (ref 0.0–0.5)
Eosinophils Relative: 3 %
HCT: 31.4 % — ABNORMAL LOW (ref 36.0–46.0)
Hemoglobin: 10 g/dL — ABNORMAL LOW (ref 12.0–15.0)
Immature Granulocytes: 1 %
Lymphocytes Relative: 27 %
Lymphs Abs: 1.3 10*3/uL (ref 0.7–4.0)
MCH: 32.2 pg (ref 26.0–34.0)
MCHC: 31.8 g/dL (ref 30.0–36.0)
MCV: 101 fL — ABNORMAL HIGH (ref 80.0–100.0)
Monocytes Absolute: 0.4 10*3/uL (ref 0.1–1.0)
Monocytes Relative: 8 %
Neutro Abs: 2.8 10*3/uL (ref 1.7–7.7)
Neutrophils Relative %: 60 %
Platelet Count: 129 10*3/uL — ABNORMAL LOW (ref 150–400)
RBC: 3.11 MIL/uL — ABNORMAL LOW (ref 3.87–5.11)
RDW: 12.4 % (ref 11.5–15.5)
WBC Count: 4.7 10*3/uL (ref 4.0–10.5)
nRBC: 0 % (ref 0.0–0.2)

## 2021-03-30 LAB — IRON AND IRON BINDING CAPACITY (CC-WL,HP ONLY)
Iron: 109 ug/dL (ref 28–170)
Saturation Ratios: 43 % — ABNORMAL HIGH (ref 10.4–31.8)
TIBC: 256 ug/dL (ref 250–450)
UIBC: 147 ug/dL — ABNORMAL LOW (ref 148–442)

## 2021-03-30 LAB — RETICULOCYTES
Immature Retic Fract: 10.2 % (ref 2.3–15.9)
RBC.: 3.07 MIL/uL — ABNORMAL LOW (ref 3.87–5.11)
Retic Count, Absolute: 58 10*3/uL (ref 19.0–186.0)
Retic Ct Pct: 1.9 % (ref 0.4–3.1)

## 2021-03-30 LAB — FERRITIN: Ferritin: 613 ng/mL — ABNORMAL HIGH (ref 11–307)

## 2021-03-30 MED ORDER — EPOETIN ALFA-EPBX 40000 UNIT/ML IJ SOLN
40000.0000 [IU] | Freq: Once | INTRAMUSCULAR | Status: AC
  Administered 2021-03-30: 40000 [IU] via SUBCUTANEOUS
  Filled 2021-03-30: qty 1

## 2021-03-30 NOTE — Telephone Encounter (Signed)
Per 03/30/21 los - gave upcoming appointments - confirmed

## 2021-03-30 NOTE — Patient Instructions (Signed)
Epoetin Alfa injection °What is this medication? °EPOETIN ALFA (e POE e tin AL fa) helps your body make more red blood cells. This medicine is used to treat anemia caused by chronic kidney disease, cancer chemotherapy, or HIV-therapy. It may also be used before surgery if you have anemia. °This medicine may be used for other purposes; ask your health care provider or pharmacist if you have questions. °COMMON BRAND NAME(S): Epogen, Procrit, Retacrit °What should I tell my care team before I take this medication? °They need to know if you have any of these conditions: °cancer °heart disease °high blood pressure °history of blood clots °history of stroke °low levels of folate, iron, or vitamin B12 in the blood °seizures °an unusual or allergic reaction to erythropoietin, albumin, benzyl alcohol, hamster proteins, other medicines, foods, dyes, or preservatives °pregnant or trying to get pregnant °breast-feeding °How should I use this medication? °This medicine is for injection into a vein or under the skin. It is usually given by a health care professional in a hospital or clinic setting. °If you get this medicine at home, you will be taught how to prepare and give this medicine. Use exactly as directed. Take your medicine at regular intervals. Do not take your medicine more often than directed. °It is important that you put your used needles and syringes in a special sharps container. Do not put them in a trash can. If you do not have a sharps container, call your pharmacist or healthcare provider to get one. °A special MedGuide will be given to you by the pharmacist with each prescription and refill. Be sure to read this information carefully each time. °Talk to your pediatrician regarding the use of this medicine in children. While this drug may be prescribed for selected conditions, precautions do apply. °Overdosage: If you think you have taken too much of this medicine contact a poison control center or emergency  room at once. °NOTE: This medicine is only for you. Do not share this medicine with others. °What if I miss a dose? °If you miss a dose, take it as soon as you can. If it is almost time for your next dose, take only that dose. Do not take double or extra doses. °What may interact with this medication? °Interactions have not been studied. °This list may not describe all possible interactions. Give your health care provider a list of all the medicines, herbs, non-prescription drugs, or dietary supplements you use. Also tell them if you smoke, drink alcohol, or use illegal drugs. Some items may interact with your medicine. °What should I watch for while using this medication? °Your condition will be monitored carefully while you are receiving this medicine. °You may need blood work done while you are taking this medicine. °This medicine may cause a decrease in vitamin B6. You should make sure that you get enough vitamin B6 while you are taking this medicine. Discuss the foods you eat and the vitamins you take with your health care professional. °What side effects may I notice from receiving this medication? °Side effects that you should report to your doctor or health care professional as soon as possible: °allergic reactions like skin rash, itching or hives, swelling of the face, lips, or tongue °seizures °signs and symptoms of a blood clot such as breathing problems; changes in vision; chest pain; severe, sudden headache; pain, swelling, warmth in the leg; trouble speaking; sudden numbness or weakness of the face, arm or leg °signs and symptoms of a stroke like   changes in vision; confusion; trouble speaking or understanding; severe headaches; sudden numbness or weakness of the face, arm or leg; trouble walking; dizziness; loss of balance or coordination °Side effects that usually do not require medical attention (report to your doctor or health care professional if they continue or are  bothersome): °chills °cough °dizziness °fever °headaches °joint pain °muscle cramps °muscle pain °nausea, vomiting °pain, redness, or irritation at site where injected °This list may not describe all possible side effects. Call your doctor for medical advice about side effects. You may report side effects to FDA at 1-800-FDA-1088. °Where should I keep my medication? °Keep out of the reach of children. °Store in a refrigerator between 2 and 8 degrees C (36 and 46 degrees F). Do not freeze or shake. Throw away any unused portion if using a single-dose vial. Multi-dose vials can be kept in the refrigerator for up to 21 days after the initial dose. Throw away unused medicine. °NOTE: This sheet is a summary. It may not cover all possible information. If you have questions about this medicine, talk to your doctor, pharmacist, or health care provider. °© 2022 [employer] Standard (2016-11-08 00:00:00) ° °

## 2021-03-30 NOTE — Progress Notes (Signed)
Hematology and Oncology Follow Up Visit  Rachael Davenport [id number] [birth year] 76 y.o. 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 She is doing well and has no complaints at this time.  No blood loss noted. No bruising or petechiae.  No fever, chills, n/v, cough, rash, dizziness, SOB, chest pain, palpitations, abdominal pain or changes in bowel or bladder habits.  No swelling, tenderness, numbness or tingling in her extremities at this time.  No falls or syncope to report.  She is eating well and making an effort to stay well hydrated. Her weight is stable at 111 lbs.   ECOG Performance Status: 1 - Symptomatic but completely ambulatory  Medications:  Allergies as of 03/30/2021       Reactions   Penicillins Itching, Swelling   Has patient had a PCN reaction causing immediate rash, facial/tongue/throat swelling, SOB or lightheadedness with hypotension: Yes Has patient had a PCN reaction causing severe rash involving mucus membranes or skin necrosis: No Has patient had a PCN reaction that required hospitalization: No Has patient had a PCN reaction occurring within the last 10 years: No If all of the above answers are "NO", then may proceed with Cephalosporin use.   Valsartan Itching, Other (See Comments)   Heart sped up   Amlodipine Itching        Medication List        Accurate as of March 30, 2021 10:33 AM. If you have any questions, ask your nurse or doctor.          STOP taking these medications    amLODipine 5 MG tablet Commonly known as: NORVASC Stopped by: Lottie Dawson, NP   aspirin EC 81 MG tablet Stopped by: Lottie Dawson, NP   docusate sodium 250 MG capsule Commonly known as: COLACE Stopped by:  Lottie Dawson, NP       TAKE these medications    carvedilol 3.125 MG tablet Commonly known as: COREG Take 3.125 mg by mouth 2 (two) times daily.   Estradiol 4 MCG Inst Imvexxy Maintenance Pack 4 mcg vaginal insert  Insert 1 vaginal insert twice a week by vaginal route.   furosemide 20 MG tablet Commonly known as: LASIX Take by mouth.   polyethylene glycol 17 g packet Commonly known as: MIRALAX / GLYCOLAX Take 17 g by mouth daily as needed.   pravastatin 10 MG tablet Commonly known as: PRAVACHOL pravastatin 10 mg tablet   Restasis 0.05 % ophthalmic emulsion Generic drug: cycloSPORINE INT 1 GTT IN OU BID UTD   SUMAtriptan 25 MG tablet Commonly known as: IMITREX Take 25 mg by mouth once as needed for migraine (MAY REPEAT ONCE IN 2 HOURS IF NO RELIEF (MAX 2 TABLETS/24 HOURS)).   Vitamin D (Ergocalciferol) 1.25 MG (50000 UNIT) Caps capsule Commonly known as: DRISDOL Take 50,000 Units by mouth every 7 (seven) days.        Allergies:  Allergies  Allergen Reactions   Penicillins Itching and Swelling    Has patient had a PCN reaction causing immediate rash, facial/tongue/throat swelling, SOB or lightheadedness with hypotension: Yes Has patient had a PCN reaction causing severe rash involving mucus membranes or skin necrosis: No Has patient had a PCN reaction that required hospitalization:  No Has patient had a PCN reaction occurring within the last 10 years: No If all of the above answers are "NO", then may proceed with Cephalosporin use.    Valsartan Itching and Other (See Comments)    Heart sped up   Amlodipine Itching    Past Medical History, Surgical history, Social history, and Family History were reviewed and updated.  Review of Systems: All other 10 point review of systems is negative.   Physical Exam:  vitals were not taken for this visit.   Wt Readings from Last 3 Encounters:  02/16/21 110 lb 0.6 oz (49.9 kg)  01/05/21 113 lb (51.3 kg)  11/19/20 108  lb 1.9 oz (49 kg)    Ocular: Sclerae unicteric, pupils equal, round and reactive to light Ear-nose-throat: Oropharynx clear, dentition fair Lymphatic: No cervical or supraclavicular adenopathy Lungs no rales or rhonchi, good excursion bilaterally Heart regular rate and rhythm, no murmur appreciated Abd soft, nontender, positive bowel sounds MSK no focal spinal tenderness, no joint edema Neuro: non-focal, well-oriented, appropriate affect Breasts: Deferred   Lab Results  Component Value Date   WBC 4.7 03/30/2021   HGB 10.0 (L) 03/30/2021   HCT 31.4 (L) 03/30/2021   MCV 101.0 (H) 03/30/2021   PLT 129 (L) 03/30/2021   Lab Results  Component Value Date   FERRITIN 375 (H) 02/16/2021   IRON 89 02/16/2021   TIBC 300 02/16/2021   UIBC 211 02/16/2021   IRONPCTSAT 30 02/16/2021   Lab Results  Component Value Date   RETICCTPCT 1.9 03/30/2021   RBC 3.11 (L) 03/30/2021   RBC 3.07 (L) 03/30/2021   No results found for: KPAFRELGTCHN, LAMBDASER, KAPLAMBRATIO No results found for: IGGSERUM, IGA, IGMSERUM No results found for: Odetta Pink, SPEI   Chemistry      Component Value Date/Time   NA 140 02/16/2021 1122   NA 145 02/13/2017 1046   NA 142 12/23/2016 0951   K 3.9 02/16/2021 1122   K 4.6 02/13/2017 1046   K 3.9 12/23/2016 0951   CL 103 02/16/2021 1122   CL 108 02/13/2017 1046   CO2 30 02/16/2021 1122   CO2 28 02/13/2017 1046   CO2 25 12/23/2016 0951   BUN 18 02/16/2021 1122   BUN 22 02/13/2017 1046   BUN 15.0 12/23/2016 0951   CREATININE 0.90 02/16/2021 1122   CREATININE 1.1 02/13/2017 1046   CREATININE 0.9 12/23/2016 0951      Component Value Date/Time   CALCIUM 10.2 02/16/2021 1122   CALCIUM 9.7 02/13/2017 1046   CALCIUM 9.5 12/23/2016 0951   ALKPHOS 73 02/16/2021 1122   ALKPHOS 60 02/13/2017 1046   ALKPHOS 69 12/23/2016 0951   AST 19 02/16/2021 1122   AST 26 12/23/2016 0951   ALT 10 02/16/2021 1122   ALT  18 02/13/2017 1046   ALT 16 12/23/2016 0951   BILITOT 1.0 02/16/2021 1122   BILITOT 0.96 12/23/2016 0951       Impression and Plan: Rachael Davenport is a very pleasant 76 yo African American female with anemia of chronic renal insufficiency and erythropoietin deficiency. ESA given, Hgb 10.0.  Iron studies pending.  Follow-up in 6 weeks.   Lottie Dawson, NP 1/10/202310:33 AM

## 2021-05-11 ENCOUNTER — Inpatient Hospital Stay: Payer: Medicare Other

## 2021-05-11 ENCOUNTER — Other Ambulatory Visit: Payer: Self-pay

## 2021-05-11 ENCOUNTER — Telehealth: Payer: Self-pay | Admitting: *Deleted

## 2021-05-11 ENCOUNTER — Encounter: Payer: Self-pay | Admitting: Family

## 2021-05-11 ENCOUNTER — Inpatient Hospital Stay: Payer: Medicare Other | Attending: Family

## 2021-05-11 ENCOUNTER — Inpatient Hospital Stay: Payer: Medicare Other | Admitting: Family

## 2021-05-11 VITALS — BP 158/57 | HR 58 | Temp 98.2°F | Resp 18 | Ht 62.0 in | Wt 110.8 lb

## 2021-05-11 DIAGNOSIS — D500 Iron deficiency anemia secondary to blood loss (chronic): Secondary | ICD-10-CM

## 2021-05-11 DIAGNOSIS — D631 Anemia in chronic kidney disease: Secondary | ICD-10-CM

## 2021-05-11 DIAGNOSIS — N184 Chronic kidney disease, stage 4 (severe): Secondary | ICD-10-CM | POA: Insufficient documentation

## 2021-05-11 LAB — CBC WITH DIFFERENTIAL (CANCER CENTER ONLY)
Abs Immature Granulocytes: 0.01 10*3/uL (ref 0.00–0.07)
Basophils Absolute: 0 10*3/uL (ref 0.0–0.1)
Basophils Relative: 1 %
Eosinophils Absolute: 0.1 10*3/uL (ref 0.0–0.5)
Eosinophils Relative: 2 %
HCT: 31.8 % — ABNORMAL LOW (ref 36.0–46.0)
Hemoglobin: 10.4 g/dL — ABNORMAL LOW (ref 12.0–15.0)
Immature Granulocytes: 0 %
Lymphocytes Relative: 34 %
Lymphs Abs: 1.6 10*3/uL (ref 0.7–4.0)
MCH: 31.8 pg (ref 26.0–34.0)
MCHC: 32.7 g/dL (ref 30.0–36.0)
MCV: 97.2 fL (ref 80.0–100.0)
Monocytes Absolute: 0.6 10*3/uL (ref 0.1–1.0)
Monocytes Relative: 11 %
Neutro Abs: 2.5 10*3/uL (ref 1.7–7.7)
Neutrophils Relative %: 52 %
Platelet Count: 135 10*3/uL — ABNORMAL LOW (ref 150–400)
RBC: 3.27 MIL/uL — ABNORMAL LOW (ref 3.87–5.11)
RDW: 12.5 % (ref 11.5–15.5)
WBC Count: 4.9 10*3/uL (ref 4.0–10.5)
nRBC: 0 % (ref 0.0–0.2)

## 2021-05-11 LAB — CMP (CANCER CENTER ONLY)
ALT: 9 U/L (ref 0–44)
AST: 17 U/L (ref 15–41)
Albumin: 3.9 g/dL (ref 3.5–5.0)
Alkaline Phosphatase: 65 U/L (ref 38–126)
Anion gap: 7 (ref 5–15)
BUN: 18 mg/dL (ref 8–23)
CO2: 24 mmol/L (ref 22–32)
Calcium: 9.2 mg/dL (ref 8.9–10.3)
Chloride: 106 mmol/L (ref 98–111)
Creatinine: 0.97 mg/dL (ref 0.44–1.00)
GFR, Estimated: >60 mL/min (ref >60)
Glucose, Bld: 81 mg/dL (ref 70–99)
Potassium: 4.1 mmol/L (ref 3.5–5.1)
Sodium: 137 mmol/L (ref 135–145)
Total Bilirubin: 0.9 mg/dL (ref 0.3–1.2)
Total Protein: 7.1 g/dL (ref 6.5–8.1)

## 2021-05-11 LAB — RETICULOCYTES
Immature Retic Fract: 8.4 % (ref 2.3–15.9)
RBC.: 3.22 MIL/uL — ABNORMAL LOW (ref 3.87–5.11)
Retic Count, Absolute: 49.6 10*3/uL (ref 19.0–186.0)
Retic Ct Pct: 1.5 % (ref 0.4–3.1)

## 2021-05-11 MED ORDER — EPOETIN ALFA-EPBX 40000 UNIT/ML IJ SOLN
40000.0000 [IU] | Freq: Once | INTRAMUSCULAR | Status: AC
  Administered 2021-05-11: 40000 [IU] via SUBCUTANEOUS
  Filled 2021-05-11: qty 1

## 2021-05-11 NOTE — Telephone Encounter (Signed)
Per 05/11/21 los - gave upcoming appointments - confirmed °

## 2021-05-11 NOTE — Progress Notes (Signed)
Hematology and Oncology Follow Up Visit  Rachael Davenport [id number] 10-10-1945 76 y.o. 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 She is doing well and states that she recently saw a vascular and vein specialist and was diagnosed with chronic venous insufficiency and hemosiderin staining. She will be having laser treatment on 05/31/2021.  She is symptomatic with fatigue.  No obvious blood loss noted. No petechiae.  No fever, chills, n/v, cough, rash, dizziness, SOB, chest pain, palpitations, abdominal pain or changes in bowel or bladder habits.  Minimal swelling in her feet and ankles comes and goes. Pedal pulses are 1+.  No falls or syncope to report.  She has maintained a good appetite and is staying well hydrated. Her weight is stable at 110 lbs.   ECOG Performance Status: 1 - Symptomatic but completely ambulatory  Medications:  Allergies as of 05/11/2021       Reactions   Penicillins Itching, Swelling   Has patient had a PCN reaction causing immediate rash, facial/tongue/throat swelling, SOB or lightheadedness with hypotension: Yes Has patient had a PCN reaction causing severe rash involving mucus membranes or skin necrosis: No Has patient had a PCN reaction that required hospitalization: No Has patient had a PCN reaction occurring within the last 10 years: No If all of the above answers are "NO", then may proceed with Cephalosporin use.   Valsartan Itching, Other (See Comments)   Heart sped up   Amlodipine Itching        Medication List        Accurate as of May 11, 2021  2:19 PM. If you have any questions, ask your nurse or doctor.          carvedilol 3.125 MG tablet Commonly known as:  COREG Take 3.125 mg by mouth 2 (two) times daily.   Estradiol 4 MCG Inst Imvexxy Maintenance Pack 4 mcg vaginal insert  Insert 1 vaginal insert twice a week by vaginal route.   furosemide 20 MG tablet Commonly known as: LASIX Take by mouth.   polyethylene glycol 17 g packet Commonly known as: MIRALAX / GLYCOLAX Take 17 g by mouth daily as needed.   pravastatin 10 MG tablet Commonly known as: PRAVACHOL pravastatin 10 mg tablet   Restasis 0.05 % ophthalmic emulsion Generic drug: cycloSPORINE INT 1 GTT IN OU BID UTD   SUMAtriptan 25 MG tablet Commonly known as: IMITREX Take 25 mg by mouth once as needed for migraine (MAY REPEAT ONCE IN 2 HOURS IF NO RELIEF (MAX 2 TABLETS/24 HOURS)).   Vitamin D (Ergocalciferol) 1.25 MG (50000 UNIT) Caps capsule Commonly known as: DRISDOL Take 50,000 Units by mouth every 7 (seven) days.        Allergies:  Allergies  Allergen Reactions   Penicillins Itching and Swelling    Has patient had a PCN reaction causing immediate rash, facial/tongue/throat swelling, SOB or lightheadedness with hypotension: Yes Has patient had a PCN reaction causing severe rash involving mucus membranes or skin necrosis: No Has patient had a PCN reaction that required hospitalization: No Has patient had a PCN reaction occurring within the last 10 years: No If all of the above answers are "NO", then may proceed with Cephalosporin use.  Valsartan Itching and Other (See Comments)    Heart sped up   Amlodipine Itching    Past Medical History, Surgical history, Social history, and Family History were reviewed and updated.  Review of Systems: All other 10 point review of systems is negative.   Physical Exam:  vitals were not taken for this visit.   Wt Readings from Last 3 Encounters:  03/30/21 111 lb 6.4 oz (50.5 kg)  02/16/21 110 lb 0.6 oz (49.9 kg)  01/05/21 113 lb (51.3 kg)    Ocular: Sclerae unicteric, pupils equal, round and reactive to  light Ear-nose-throat: Oropharynx clear, dentition fair Lymphatic: No cervical or supraclavicular adenopathy Lungs no rales or rhonchi, good excursion bilaterally Heart regular rate and rhythm, no murmur appreciated Abd soft, nontender, positive bowel sounds MSK no focal spinal tenderness, no joint edema Neuro: non-focal, well-oriented, appropriate affect Breasts: Deferred   Lab Results  Component Value Date   WBC 4.9 05/11/2021   HGB 10.4 (L) 05/11/2021   HCT 31.8 (L) 05/11/2021   MCV 97.2 05/11/2021   PLT 135 (L) 05/11/2021   Lab Results  Component Value Date   FERRITIN 613 (H) 03/30/2021   IRON 109 03/30/2021   TIBC 256 03/30/2021   UIBC 147 (L) 03/30/2021   IRONPCTSAT 43 (H) 03/30/2021   Lab Results  Component Value Date   RETICCTPCT 1.5 05/11/2021   RBC 3.27 (L) 05/11/2021   RBC 3.22 (L) 05/11/2021   No results found for: KPAFRELGTCHN, LAMBDASER, KAPLAMBRATIO No results found for: IGGSERUM, IGA, IGMSERUM No results found for: Odetta Pink, SPEI   Chemistry      Component Value Date/Time   NA 140 03/30/2021 1012   NA 145 02/13/2017 1046   NA 142 12/23/2016 0951   K 4.4 03/30/2021 1012   K 4.6 02/13/2017 1046   K 3.9 12/23/2016 0951   CL 104 03/30/2021 1012   CL 108 02/13/2017 1046   CO2 30 03/30/2021 1012   CO2 28 02/13/2017 1046   CO2 25 12/23/2016 0951   BUN 19 03/30/2021 1012   BUN 22 02/13/2017 1046   BUN 15.0 12/23/2016 0951   CREATININE 1.17 (H) 03/30/2021 1012   CREATININE 1.1 02/13/2017 1046   CREATININE 0.9 12/23/2016 0951      Component Value Date/Time   CALCIUM 10.0 03/30/2021 1012   CALCIUM 9.7 02/13/2017 1046   CALCIUM 9.5 12/23/2016 0951   ALKPHOS 63 03/30/2021 1012   ALKPHOS 60 02/13/2017 1046   ALKPHOS 69 12/23/2016 0951   AST 18 03/30/2021 1012   AST 26 12/23/2016 0951   ALT 10 03/30/2021 1012   ALT 18 02/13/2017 1046   ALT 16 12/23/2016 0951   BILITOT 0.9 03/30/2021 1012    BILITOT 0.96 12/23/2016 0951       Impression and Plan: Rachael Davenport is a very pleasant 76 yo African American female with anemia of chronic renal insufficiency and erythropoietin deficiency. ESA given, Hgb 10.4.  Iron studies are pending.  Follow-up in 6 weeks.   Lottie Dawson, NP 2/21/20232:19 PM

## 2021-05-11 NOTE — Patient Instructions (Signed)
Epoetin Alfa injection °What is this medication? °EPOETIN ALFA (e POE e tin AL fa) helps your body make more red blood cells. This medicine is used to treat anemia caused by chronic kidney disease, cancer chemotherapy, or HIV-therapy. It may also be used before surgery if you have anemia. °This medicine may be used for other purposes; ask your health care provider or pharmacist if you have questions. °COMMON BRAND NAME(S): Epogen, Procrit, Retacrit °What should I tell my care team before I take this medication? °They need to know if you have any of these conditions: °cancer °heart disease °high blood pressure °history of blood clots °history of stroke °low levels of folate, iron, or vitamin B12 in the blood °seizures °an unusual or allergic reaction to erythropoietin, albumin, benzyl alcohol, hamster proteins, other medicines, foods, dyes, or preservatives °pregnant or trying to get pregnant °breast-feeding °How should I use this medication? °This medicine is for injection into a vein or under the skin. It is usually given by a health care professional in a hospital or clinic setting. °If you get this medicine at home, you will be taught how to prepare and give this medicine. Use exactly as directed. Take your medicine at regular intervals. Do not take your medicine more often than directed. °It is important that you put your used needles and syringes in a special sharps container. Do not put them in a trash can. If you do not have a sharps container, call your pharmacist or healthcare provider to get one. °A special MedGuide will be given to you by the pharmacist with each prescription and refill. Be sure to read this information carefully each time. °Talk to your pediatrician regarding the use of this medicine in children. While this drug may be prescribed for selected conditions, precautions do apply. °Overdosage: If you think you have taken too much of this medicine contact a poison control center or emergency  room at once. °NOTE: This medicine is only for you. Do not share this medicine with others. °What if I miss a dose? °If you miss a dose, take it as soon as you can. If it is almost time for your next dose, take only that dose. Do not take double or extra doses. °What may interact with this medication? °Interactions have not been studied. °This list may not describe all possible interactions. Give your health care provider a list of all the medicines, herbs, non-prescription drugs, or dietary supplements you use. Also tell them if you smoke, drink alcohol, or use illegal drugs. Some items may interact with your medicine. °What should I watch for while using this medication? °Your condition will be monitored carefully while you are receiving this medicine. °You may need blood work done while you are taking this medicine. °This medicine may cause a decrease in vitamin B6. You should make sure that you get enough vitamin B6 while you are taking this medicine. Discuss the foods you eat and the vitamins you take with your health care professional. °What side effects may I notice from receiving this medication? °Side effects that you should report to your doctor or health care professional as soon as possible: °allergic reactions like skin rash, itching or hives, swelling of the face, lips, or tongue °seizures °signs and symptoms of a blood clot such as breathing problems; changes in vision; chest pain; severe, sudden headache; pain, swelling, warmth in the leg; trouble speaking; sudden numbness or weakness of the face, arm or leg °signs and symptoms of a stroke like   changes in vision; confusion; trouble speaking or understanding; severe headaches; sudden numbness or weakness of the face, arm or leg; trouble walking; dizziness; loss of balance or coordination °Side effects that usually do not require medical attention (report to your doctor or health care professional if they continue or are  bothersome): °chills °cough °dizziness °fever °headaches °joint pain °muscle cramps °muscle pain °nausea, vomiting °pain, redness, or irritation at site where injected °This list may not describe all possible side effects. Call your doctor for medical advice about side effects. You may report side effects to FDA at 1-800-FDA-1088. °Where should I keep my medication? °Keep out of the reach of children. °Store in a refrigerator between 2 and 8 degrees C (36 and 46 degrees F). Do not freeze or shake. Throw away any unused portion if using a single-dose vial. Multi-dose vials can be kept in the refrigerator for up to 21 days after the initial dose. Throw away unused medicine. °NOTE: This sheet is a summary. It may not cover all possible information. If you have questions about this medicine, talk to your doctor, pharmacist, or health care provider. °© 2022 [employer] Standard (2016-11-08 00:00:00) ° °

## 2021-05-12 LAB — IRON AND IRON BINDING CAPACITY (CC-WL,HP ONLY)
Iron: 104 ug/dL (ref 28–170)
Saturation Ratios: 36 % — ABNORMAL HIGH (ref 10.4–31.8)
TIBC: 291 ug/dL (ref 250–450)
UIBC: 187 ug/dL (ref 148–442)

## 2021-05-12 LAB — FERRITIN: Ferritin: 487 ng/mL — ABNORMAL HIGH (ref 11–307)

## 2021-06-03 ENCOUNTER — Encounter: Payer: Self-pay | Admitting: Family

## 2021-06-04 ENCOUNTER — Telehealth: Payer: Self-pay | Admitting: Family

## 2021-06-04 NOTE — Telephone Encounter (Signed)
Called to schedule per sch msg,patient needs lab and possible inj, left voicemail  ?

## 2021-06-07 ENCOUNTER — Inpatient Hospital Stay: Payer: Medicare Other | Attending: Family

## 2021-06-07 ENCOUNTER — Other Ambulatory Visit: Payer: Self-pay | Admitting: Family

## 2021-06-07 ENCOUNTER — Inpatient Hospital Stay: Payer: Medicare Other

## 2021-06-07 ENCOUNTER — Other Ambulatory Visit: Payer: Self-pay

## 2021-06-07 DIAGNOSIS — N189 Chronic kidney disease, unspecified: Secondary | ICD-10-CM | POA: Diagnosis present

## 2021-06-07 DIAGNOSIS — D631 Anemia in chronic kidney disease: Secondary | ICD-10-CM | POA: Insufficient documentation

## 2021-06-07 LAB — CBC WITH DIFFERENTIAL (CANCER CENTER ONLY)
Abs Immature Granulocytes: 0.01 10*3/uL (ref 0.00–0.07)
Basophils Absolute: 0 10*3/uL (ref 0.0–0.1)
Basophils Relative: 1 %
Eosinophils Absolute: 0.1 10*3/uL (ref 0.0–0.5)
Eosinophils Relative: 2 %
HCT: 34.2 % — ABNORMAL LOW (ref 36.0–46.0)
Hemoglobin: 11.1 g/dL — ABNORMAL LOW (ref 12.0–15.0)
Immature Granulocytes: 0 %
Lymphocytes Relative: 35 %
Lymphs Abs: 1.6 10*3/uL (ref 0.7–4.0)
MCH: 32 pg (ref 26.0–34.0)
MCHC: 32.5 g/dL (ref 30.0–36.0)
MCV: 98.6 fL (ref 80.0–100.0)
Monocytes Absolute: 0.4 10*3/uL (ref 0.1–1.0)
Monocytes Relative: 9 %
Neutro Abs: 2.4 10*3/uL (ref 1.7–7.7)
Neutrophils Relative %: 53 %
Platelet Count: 131 10*3/uL — ABNORMAL LOW (ref 150–400)
RBC: 3.47 MIL/uL — ABNORMAL LOW (ref 3.87–5.11)
RDW: 12.5 % (ref 11.5–15.5)
WBC Count: 4.6 10*3/uL (ref 4.0–10.5)
nRBC: 0 % (ref 0.0–0.2)

## 2021-06-07 LAB — CMP (CANCER CENTER ONLY)
ALT: 10 U/L (ref 0–44)
AST: 19 U/L (ref 15–41)
Albumin: 4.3 g/dL (ref 3.5–5.0)
Alkaline Phosphatase: 64 U/L (ref 38–126)
Anion gap: 5 (ref 5–15)
BUN: 22 mg/dL (ref 8–23)
CO2: 29 mmol/L (ref 22–32)
Calcium: 10.2 mg/dL (ref 8.9–10.3)
Chloride: 104 mmol/L (ref 98–111)
Creatinine: 0.98 mg/dL (ref 0.44–1.00)
GFR, Estimated: 60 mL/min — ABNORMAL LOW (ref >60)
Glucose, Bld: 103 mg/dL — ABNORMAL HIGH (ref 70–99)
Potassium: 4.7 mmol/L (ref 3.5–5.1)
Sodium: 138 mmol/L (ref 135–145)
Total Bilirubin: 1.2 mg/dL (ref 0.3–1.2)
Total Protein: 7.3 g/dL (ref 6.5–8.1)

## 2021-06-07 NOTE — Progress Notes (Signed)
Pt in for retacrit today dependent on labs, HGB 11.1 patient informed she did not need her injection today and was provided a copy of lab work. She has her next scheduled appt and declines any questions or concerns at this time  ?

## 2021-06-08 ENCOUNTER — Telehealth: Payer: Self-pay | Admitting: *Deleted

## 2021-06-08 NOTE — Telephone Encounter (Signed)
Per scheduling message Rachael Davenport - Called and lvm of upcoming appointment - requested call back to confirm. ?

## 2021-06-09 ENCOUNTER — Ambulatory Visit: Payer: Medicare Other

## 2021-06-09 ENCOUNTER — Other Ambulatory Visit: Payer: Medicare Other

## 2021-06-22 ENCOUNTER — Inpatient Hospital Stay: Payer: Medicare Other | Attending: Family

## 2021-06-22 ENCOUNTER — Encounter: Payer: Self-pay | Admitting: Family

## 2021-06-22 ENCOUNTER — Inpatient Hospital Stay: Payer: Medicare Other | Admitting: Family

## 2021-06-22 ENCOUNTER — Other Ambulatory Visit: Payer: Self-pay

## 2021-06-22 ENCOUNTER — Inpatient Hospital Stay: Payer: Medicare Other

## 2021-06-22 ENCOUNTER — Telehealth: Payer: Self-pay | Admitting: *Deleted

## 2021-06-22 VITALS — BP 105/50 | HR 58 | Temp 98.2°F | Resp 18 | Ht 62.0 in | Wt 106.0 lb

## 2021-06-22 DIAGNOSIS — D509 Iron deficiency anemia, unspecified: Secondary | ICD-10-CM | POA: Diagnosis not present

## 2021-06-22 DIAGNOSIS — D508 Other iron deficiency anemias: Secondary | ICD-10-CM

## 2021-06-22 DIAGNOSIS — D500 Iron deficiency anemia secondary to blood loss (chronic): Secondary | ICD-10-CM

## 2021-06-22 DIAGNOSIS — I129 Hypertensive chronic kidney disease with stage 1 through stage 4 chronic kidney disease, or unspecified chronic kidney disease: Secondary | ICD-10-CM | POA: Diagnosis present

## 2021-06-22 DIAGNOSIS — Z79899 Other long term (current) drug therapy: Secondary | ICD-10-CM | POA: Insufficient documentation

## 2021-06-22 DIAGNOSIS — N183 Chronic kidney disease, stage 3 unspecified: Secondary | ICD-10-CM | POA: Insufficient documentation

## 2021-06-22 DIAGNOSIS — D631 Anemia in chronic kidney disease: Secondary | ICD-10-CM

## 2021-06-22 LAB — CBC WITH DIFFERENTIAL (CANCER CENTER ONLY)
Abs Immature Granulocytes: 0.03 10*3/uL (ref 0.00–0.07)
Basophils Absolute: 0 10*3/uL (ref 0.0–0.1)
Basophils Relative: 1 %
Eosinophils Absolute: 0.1 10*3/uL (ref 0.0–0.5)
Eosinophils Relative: 3 %
HCT: 32.5 % — ABNORMAL LOW (ref 36.0–46.0)
Hemoglobin: 10.5 g/dL — ABNORMAL LOW (ref 12.0–15.0)
Immature Granulocytes: 1 %
Lymphocytes Relative: 27 %
Lymphs Abs: 1 10*3/uL (ref 0.7–4.0)
MCH: 31.9 pg (ref 26.0–34.0)
MCHC: 32.3 g/dL (ref 30.0–36.0)
MCV: 98.8 fL (ref 80.0–100.0)
Monocytes Absolute: 0.3 10*3/uL (ref 0.1–1.0)
Monocytes Relative: 9 %
Neutro Abs: 2.3 10*3/uL (ref 1.7–7.7)
Neutrophils Relative %: 59 %
Platelet Count: 134 10*3/uL — ABNORMAL LOW (ref 150–400)
RBC: 3.29 MIL/uL — ABNORMAL LOW (ref 3.87–5.11)
RDW: 12.4 % (ref 11.5–15.5)
WBC Count: 3.9 10*3/uL — ABNORMAL LOW (ref 4.0–10.5)
nRBC: 0 % (ref 0.0–0.2)

## 2021-06-22 LAB — RETICULOCYTES
Immature Retic Fract: 7.7 % (ref 2.3–15.9)
RBC.: 3.33 MIL/uL — ABNORMAL LOW (ref 3.87–5.11)
Retic Count, Absolute: 39 10*3/uL (ref 19.0–186.0)
Retic Ct Pct: 1.2 % (ref 0.4–3.1)

## 2021-06-22 LAB — CMP (CANCER CENTER ONLY)
ALT: 8 U/L (ref 0–44)
AST: 16 U/L (ref 15–41)
Albumin: 4.1 g/dL (ref 3.5–5.0)
Alkaline Phosphatase: 64 U/L (ref 38–126)
Anion gap: 7 (ref 5–15)
BUN: 14 mg/dL (ref 8–23)
CO2: 29 mmol/L (ref 22–32)
Calcium: 9.8 mg/dL (ref 8.9–10.3)
Chloride: 103 mmol/L (ref 98–111)
Creatinine: 0.95 mg/dL (ref 0.44–1.00)
GFR, Estimated: >60 mL/min (ref >60)
Glucose, Bld: 155 mg/dL — ABNORMAL HIGH (ref 70–99)
Potassium: 3.8 mmol/L (ref 3.5–5.1)
Sodium: 139 mmol/L (ref 135–145)
Total Bilirubin: 0.9 mg/dL (ref 0.3–1.2)
Total Protein: 6.8 g/dL (ref 6.5–8.1)

## 2021-06-22 LAB — IRON AND IRON BINDING CAPACITY (CC-WL,HP ONLY)
Iron: 96 ug/dL (ref 28–170)
Saturation Ratios: 36 % — ABNORMAL HIGH (ref 10.4–31.8)
TIBC: 266 ug/dL (ref 250–450)
UIBC: 170 ug/dL (ref 148–442)

## 2021-06-22 LAB — FERRITIN: Ferritin: 557 ng/mL — ABNORMAL HIGH (ref 11–307)

## 2021-06-22 MED ORDER — EPOETIN ALFA-EPBX 40000 UNIT/ML IJ SOLN
40000.0000 [IU] | Freq: Once | INTRAMUSCULAR | Status: AC
  Administered 2021-06-22: 40000 [IU] via SUBCUTANEOUS
  Filled 2021-06-22: qty 1

## 2021-06-22 NOTE — Telephone Encounter (Signed)
Per 06/22/21 los - gave upcoming appointments - confirmed ?

## 2021-06-22 NOTE — Progress Notes (Signed)
?Hematology and Oncology Follow Up Visit ? ?Mikal Wisman ?[id number] ?July 10, 1945 76 y.o. ?06/22/2021 ? ? 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  ?Current Therapy:        ?Retacrit 40,000 units SQ for Hgb < 11 ?IV iron as indicated  ?  ?Interim History:  Ms. Win is here today for follow-up. She is doing well and has no complaints at this time.  ?No blood loss noted. No bruising or petechiae.  ?No fever, chills, n/v, cough, rash, dizziness, SOB, chest pain, palpitations, abdominal pain or changes in bowel or bladder habits.  ?She states that the laser procedure on the veins in her right leg went well. She is wearing her compression stocking. Pedal pulses are 1+.  ?No numbness or tingling in her extremities.  ?No falls or syncope.  ?She has maintained a good appetite and is staying well hydrated. Her weight is 106 lbs.  ? ?ECOG Performance Status: 1 - Symptomatic but completely ambulatory ? ?Medications:  ?Allergies as of 06/22/2021   ? ?   Reactions  ? Penicillins Itching, Swelling  ? Has patient had a PCN reaction causing immediate rash, facial/tongue/throat swelling, SOB or lightheadedness with hypotension: Yes ?Has patient had a PCN reaction causing severe rash involving mucus membranes or skin necrosis: No ?Has patient had a PCN reaction that required hospitalization: No ?Has patient had a PCN reaction occurring within the last 10 years: No ?If all of the above answers are "NO", then may proceed with Cephalosporin use.  ? Valsartan Itching, Other (See Comments)  ? Heart sped up  ? Amlodipine Itching  ? ?  ? ?  ?Medication List  ?  ? ?  ? Accurate as of June 22, 2021 10:25 AM. If you have any questions, ask your nurse or doctor.  ?  ?  ? ?  ? ?carvedilol 3.125 MG tablet ?Commonly known as: COREG ?Take 3.125 mg by mouth 2 (two) times daily. ?  ?Estradiol 4 MCG Inst ?Imvexxy Maintenance Pack 4 mcg vaginal  insert ? Insert 1 vaginal insert twice a week by vaginal route. ?  ?furosemide 20 MG tablet ?Commonly known as: LASIX ?Take by mouth. ?  ?polyethylene glycol 17 g packet ?Commonly known as: MIRALAX / GLYCOLAX ?Take 17 g by mouth daily as needed. ?  ?pravastatin 10 MG tablet ?Commonly known as: PRAVACHOL ?pravastatin 10 mg tablet ?  ?Restasis 0.05 % ophthalmic emulsion ?Generic drug: cycloSPORINE ?INT 1 GTT IN OU BID UTD ?  ?SUMAtriptan 25 MG tablet ?Commonly known as: IMITREX ?Take 25 mg by mouth once as needed for migraine (MAY REPEAT ONCE IN 2 HOURS IF NO RELIEF (MAX 2 TABLETS/24 HOURS)). ?  ?Vitamin D (Ergocalciferol) 1.25 MG (50000 UNIT) Caps capsule ?Commonly known as: DRISDOL ?Take 50,000 Units by mouth every 7 (seven) days. ?  ? ?  ? ? ?Allergies:  ?Allergies  ?Allergen Reactions  ? Penicillins Itching and Swelling  ?  Has patient had a PCN reaction causing immediate rash, facial/tongue/throat swelling, SOB or lightheadedness with hypotension: Yes ?Has patient had a PCN reaction causing severe rash involving mucus membranes or skin necrosis: No ?Has patient had a PCN reaction that required hospitalization: No ?Has patient had a PCN reaction occurring within the last 10 years: No ?If all of the above answers are "NO", then may proceed with Cephalosporin use. ?  ? Valsartan Itching and Other (See Comments)  ?  Heart sped up  ?  Amlodipine Itching  ? ? ?Past Medical History, Surgical history, Social history, and Family History were reviewed and updated. ? ?Review of Systems: ?All other 10 point review of systems is negative.  ? ?Physical Exam: ? vitals were not taken for this visit.  ? ?Wt Readings from Last 3 Encounters:  ?05/11/21 110 lb 12.8 oz (50.3 kg)  ?03/30/21 111 lb 6.4 oz (50.5 kg)  ?02/16/21 110 lb 0.6 oz (49.9 kg)  ? ? ?Ocular: Sclerae unicteric, pupils equal, round and reactive to light ?Ear-nose-throat: Oropharynx clear, dentition fair ?Lymphatic: No cervical or supraclavicular adenopathy ?Lungs no  rales or rhonchi, good excursion bilaterally ?Heart regular rate and rhythm, no murmur appreciated ?Abd soft, nontender, positive bowel sounds ?MSK no focal spinal tenderness, no joint edema ?Neuro: non-focal, well-oriented, appropriate affect ?Breasts: Deferred  ? ?Lab Results  ?Component Value Date  ? WBC 4.6 06/07/2021  ? HGB 11.1 (L) 06/07/2021  ? HCT 34.2 (L) 06/07/2021  ? MCV 98.6 06/07/2021  ? PLT 131 (L) 06/07/2021  ? ?Lab Results  ?Component Value Date  ? FERRITIN 487 (H) 05/11/2021  ? IRON 104 05/11/2021  ? TIBC 291 05/11/2021  ? UIBC 187 05/11/2021  ? IRONPCTSAT 36 (H) 05/11/2021  ? ?Lab Results  ?Component Value Date  ? RETICCTPCT 1.5 05/11/2021  ? RBC 3.47 (L) 06/07/2021  ? ?No results found for: KPAFRELGTCHN, LAMBDASER, KAPLAMBRATIO ?No results found for: IGGSERUM, IGA, IGMSERUM ?No results found for: TOTALPROTELP, ALBUMINELP, A1GS, A2GS, BETS, BETA2SER, GAMS, MSPIKE, SPEI ?  Chemistry   ?   ?Component Value Date/Time  ? NA 138 06/07/2021 1337  ? NA 145 02/13/2017 1046  ? NA 142 12/23/2016 0951  ? K 4.7 06/07/2021 1337  ? K 4.6 02/13/2017 1046  ? K 3.9 12/23/2016 0951  ? CL 104 06/07/2021 1337  ? CL 108 02/13/2017 1046  ? CO2 29 06/07/2021 1337  ? CO2 28 02/13/2017 1046  ? CO2 25 12/23/2016 0951  ? BUN 22 06/07/2021 1337  ? BUN 22 02/13/2017 1046  ? BUN 15.0 12/23/2016 0951  ? CREATININE 0.98 06/07/2021 1337  ? CREATININE 1.1 02/13/2017 1046  ? CREATININE 0.9 12/23/2016 0951  ?    ?Component Value Date/Time  ? CALCIUM 10.2 06/07/2021 1337  ? CALCIUM 9.7 02/13/2017 1046  ? CALCIUM 9.5 12/23/2016 0951  ? ALKPHOS 64 06/07/2021 1337  ? ALKPHOS 60 02/13/2017 1046  ? ALKPHOS 69 12/23/2016 0951  ? AST 19 06/07/2021 1337  ? AST 26 12/23/2016 0951  ? ALT 10 06/07/2021 1337  ? ALT 18 02/13/2017 1046  ? ALT 16 12/23/2016 0951  ? BILITOT 1.2 06/07/2021 1337  ? BILITOT 0.96 12/23/2016 0951  ?  ? ? ? ?Impression and Plan: Ms. Duhon is a very pleasant 76 yo African American female with anemia of chronic renal  insufficiency and erythropoietin deficiency. ?ESA given Hgb 10.5.  ?Iron studies are pending.  ?Follow-up in 6 weeks. ? ?Lottie Dawson, NP ?4/4/202310:25 AM ? ?

## 2021-06-22 NOTE — Patient Instructions (Signed)
Epoetin Alfa injection °What is this medication? °EPOETIN ALFA (e POE e tin AL fa) helps your body make more red blood cells. This medicine is used to treat anemia caused by chronic kidney disease, cancer chemotherapy, or HIV-therapy. It may also be used before surgery if you have anemia. °This medicine may be used for other purposes; ask your health care provider or pharmacist if you have questions. °COMMON BRAND NAME(S): Epogen, Procrit, Retacrit °What should I tell my care team before I take this medication? °They need to know if you have any of these conditions: °cancer °heart disease °high blood pressure °history of blood clots °history of stroke °low levels of folate, iron, or vitamin B12 in the blood °seizures °an unusual or allergic reaction to erythropoietin, albumin, benzyl alcohol, hamster proteins, other medicines, foods, dyes, or preservatives °pregnant or trying to get pregnant °breast-feeding °How should I use this medication? °This medicine is for injection into a vein or under the skin. It is usually given by a health care professional in a hospital or clinic setting. °If you get this medicine at home, you will be taught how to prepare and give this medicine. Use exactly as directed. Take your medicine at regular intervals. Do not take your medicine more often than directed. °It is important that you put your used needles and syringes in a special sharps container. Do not put them in a trash can. If you do not have a sharps container, call your pharmacist or healthcare provider to get one. °A special MedGuide will be given to you by the pharmacist with each prescription and refill. Be sure to read this information carefully each time. °Talk to your pediatrician regarding the use of this medicine in children. While this drug may be prescribed for selected conditions, precautions do apply. °Overdosage: If you think you have taken too much of this medicine contact a poison control center or emergency  room at once. °NOTE: This medicine is only for you. Do not share this medicine with others. °What if I miss a dose? °If you miss a dose, take it as soon as you can. If it is almost time for your next dose, take only that dose. Do not take double or extra doses. °What may interact with this medication? °Interactions have not been studied. °This list may not describe all possible interactions. Give your health care provider a list of all the medicines, herbs, non-prescription drugs, or dietary supplements you use. Also tell them if you smoke, drink alcohol, or use illegal drugs. Some items may interact with your medicine. °What should I watch for while using this medication? °Your condition will be monitored carefully while you are receiving this medicine. °You may need blood work done while you are taking this medicine. °This medicine may cause a decrease in vitamin B6. You should make sure that you get enough vitamin B6 while you are taking this medicine. Discuss the foods you eat and the vitamins you take with your health care professional. °What side effects may I notice from receiving this medication? °Side effects that you should report to your doctor or health care professional as soon as possible: °allergic reactions like skin rash, itching or hives, swelling of the face, lips, or tongue °seizures °signs and symptoms of a blood clot such as breathing problems; changes in vision; chest pain; severe, sudden headache; pain, swelling, warmth in the leg; trouble speaking; sudden numbness or weakness of the face, arm or leg °signs and symptoms of a stroke like   changes in vision; confusion; trouble speaking or understanding; severe headaches; sudden numbness or weakness of the face, arm or leg; trouble walking; dizziness; loss of balance or coordination °Side effects that usually do not require medical attention (report to your doctor or health care professional if they continue or are  bothersome): °chills °cough °dizziness °fever °headaches °joint pain °muscle cramps °muscle pain °nausea, vomiting °pain, redness, or irritation at site where injected °This list may not describe all possible side effects. Call your doctor for medical advice about side effects. You may report side effects to FDA at 1-800-FDA-1088. °Where should I keep my medication? °Keep out of the reach of children. °Store in a refrigerator between 2 and 8 degrees C (36 and 46 degrees F). Do not freeze or shake. Throw away any unused portion if using a single-dose vial. Multi-dose vials can be kept in the refrigerator for up to 21 days after the initial dose. Throw away unused medicine. °NOTE: This sheet is a summary. It may not cover all possible information. If you have questions about this medicine, talk to your doctor, pharmacist, or health care provider. °© 2022 [employer] Standard (2016-11-08 00:00:00) ° °

## 2021-08-03 ENCOUNTER — Inpatient Hospital Stay (HOSPITAL_BASED_OUTPATIENT_CLINIC_OR_DEPARTMENT_OTHER): Payer: Medicare Other | Admitting: Family

## 2021-08-03 ENCOUNTER — Inpatient Hospital Stay: Payer: Medicare Other

## 2021-08-03 ENCOUNTER — Inpatient Hospital Stay: Payer: Medicare Other | Attending: Family

## 2021-08-03 ENCOUNTER — Encounter: Payer: Self-pay | Admitting: Family

## 2021-08-03 VITALS — BP 137/41 | HR 63 | Temp 97.9°F | Resp 17 | Wt 109.1 lb

## 2021-08-03 DIAGNOSIS — D500 Iron deficiency anemia secondary to blood loss (chronic): Secondary | ICD-10-CM

## 2021-08-03 DIAGNOSIS — D631 Anemia in chronic kidney disease: Secondary | ICD-10-CM

## 2021-08-03 DIAGNOSIS — D509 Iron deficiency anemia, unspecified: Secondary | ICD-10-CM | POA: Insufficient documentation

## 2021-08-03 DIAGNOSIS — N183 Chronic kidney disease, stage 3 unspecified: Secondary | ICD-10-CM | POA: Insufficient documentation

## 2021-08-03 DIAGNOSIS — D508 Other iron deficiency anemias: Secondary | ICD-10-CM

## 2021-08-03 LAB — CMP (CANCER CENTER ONLY)
ALT: 9 U/L (ref 0–44)
AST: 17 U/L (ref 15–41)
Albumin: 4.4 g/dL (ref 3.5–5.0)
Alkaline Phosphatase: 66 U/L (ref 38–126)
Anion gap: 6 (ref 5–15)
BUN: 23 mg/dL (ref 8–23)
CO2: 30 mmol/L (ref 22–32)
Calcium: 10.3 mg/dL (ref 8.9–10.3)
Chloride: 105 mmol/L (ref 98–111)
Creatinine: 1.04 mg/dL — ABNORMAL HIGH (ref 0.44–1.00)
GFR, Estimated: 56 mL/min — ABNORMAL LOW (ref >60)
Glucose, Bld: 94 mg/dL (ref 70–99)
Potassium: 4.3 mmol/L (ref 3.5–5.1)
Sodium: 141 mmol/L (ref 135–145)
Total Bilirubin: 1 mg/dL (ref 0.3–1.2)
Total Protein: 7.6 g/dL (ref 6.5–8.1)

## 2021-08-03 LAB — CBC WITH DIFFERENTIAL (CANCER CENTER ONLY)
Abs Immature Granulocytes: 0.03 10*3/uL (ref 0.00–0.07)
Basophils Absolute: 0 10*3/uL (ref 0.0–0.1)
Basophils Relative: 1 %
Eosinophils Absolute: 0.1 10*3/uL (ref 0.0–0.5)
Eosinophils Relative: 3 %
HCT: 34.1 % — ABNORMAL LOW (ref 36.0–46.0)
Hemoglobin: 10.8 g/dL — ABNORMAL LOW (ref 12.0–15.0)
Immature Granulocytes: 1 %
Lymphocytes Relative: 35 %
Lymphs Abs: 1.3 10*3/uL (ref 0.7–4.0)
MCH: 31.6 pg (ref 26.0–34.0)
MCHC: 31.7 g/dL (ref 30.0–36.0)
MCV: 99.7 fL (ref 80.0–100.0)
Monocytes Absolute: 0.4 10*3/uL (ref 0.1–1.0)
Monocytes Relative: 9 %
Neutro Abs: 1.9 10*3/uL (ref 1.7–7.7)
Neutrophils Relative %: 51 %
Platelet Count: 128 10*3/uL — ABNORMAL LOW (ref 150–400)
RBC: 3.42 MIL/uL — ABNORMAL LOW (ref 3.87–5.11)
RDW: 13 % (ref 11.5–15.5)
WBC Count: 3.8 10*3/uL — ABNORMAL LOW (ref 4.0–10.5)
nRBC: 0 % (ref 0.0–0.2)

## 2021-08-03 LAB — IRON AND IRON BINDING CAPACITY (CC-WL,HP ONLY)
Iron: 92 ug/dL (ref 28–170)
Saturation Ratios: 31 % (ref 10.4–31.8)
TIBC: 294 ug/dL (ref 250–450)
UIBC: 202 ug/dL (ref 148–442)

## 2021-08-03 LAB — RETICULOCYTES
Immature Retic Fract: 10.8 % (ref 2.3–15.9)
RBC.: 3.4 MIL/uL — ABNORMAL LOW (ref 3.87–5.11)
Retic Count, Absolute: 47.9 10*3/uL (ref 19.0–186.0)
Retic Ct Pct: 1.4 % (ref 0.4–3.1)

## 2021-08-03 LAB — FERRITIN: Ferritin: 425 ng/mL — ABNORMAL HIGH (ref 11–307)

## 2021-08-03 MED ORDER — EPOETIN ALFA-EPBX 40000 UNIT/ML IJ SOLN
40000.0000 [IU] | Freq: Once | INTRAMUSCULAR | Status: AC
  Administered 2021-08-03: 40000 [IU] via SUBCUTANEOUS
  Filled 2021-08-03: qty 1

## 2021-08-03 NOTE — Progress Notes (Signed)
?Hematology and Oncology Follow Up Visit ? ?Rachael Davenport ?[id number] ?[birth year] 76 y.o. ?08/03/2021 ? ? 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  ?Current Therapy:        ?Retacrit 40,000 units SQ for Hgb < 11 ?IV iron as indicated  ?  ?Interim History:  Rachael Davenport is here today for follow-up. She is doing well and has no complaints at this time.  ?She states that she occasionally has bright red blood in her stool due to straining with constipation and hemorrhoids. She states that she is due for a colonoscopy and is following up with them soon.  ?No other blood loss noted. No bruising or petechiae.  ?No c/o fatigue.  ?No fever, chills, n/v, cough, rash, dizziness, SOB, chest pain, palpitations, abdominal pain or changes in bowel or bladder habits.  ?No swelling or tenderness in her extremities. She has tingling in the right arm and hand that comes and goes. This is unchanged from baseline.  ?No falls or syncope reported.  ?Appetite and hydration are good. Weight is stable at 109 lbs.  ? ?ECOG Performance Status: 1 - Symptomatic but completely ambulatory ? ?Medications:  ?Allergies as of 08/03/2021   ? ?   Reactions  ? Penicillins Itching, Swelling  ? Has patient had a PCN reaction causing immediate rash, facial/tongue/throat swelling, SOB or lightheadedness with hypotension: Yes ?Has patient had a PCN reaction causing severe rash involving mucus membranes or skin necrosis: No ?Has patient had a PCN reaction that required hospitalization: No ?Has patient had a PCN reaction occurring within the last 10 years: No ?If all of the above answers are "NO", then may proceed with Cephalosporin use.  ? Valsartan Itching, Other (See Comments)  ? Heart sped up  ? Amlodipine Itching  ? Patient can take lower dose Norvasc   ? ?  ? ?  ?Medication List  ?  ? ?  ? Accurate as of Aug 03, 2021 10:15 AM. If you have any questions,  ask your nurse or doctor.  ?  ?  ? ?  ? ?amLODipine 2.5 MG tablet ?Commonly known as: NORVASC ?Take 2.5 mg by mouth daily. ?  ?carvedilol 3.125 MG tablet ?Commonly known as: COREG ?Take 3.125 mg by mouth 2 (two) times daily. ?  ?Estradiol 4 MCG Inst ?Imvexxy Maintenance Pack 4 mcg vaginal insert ? Insert 1 vaginal insert twice a week by vaginal route. ?  ?furosemide 20 MG tablet ?Commonly known as: LASIX ?Take by mouth. ?  ?polyethylene glycol 17 g packet ?Commonly known as: MIRALAX / GLYCOLAX ?Take 17 g by mouth daily as needed. ?  ?pravastatin 10 MG tablet ?Commonly known as: PRAVACHOL ?pravastatin 10 mg tablet ?  ?Restasis 0.05 % ophthalmic emulsion ?Generic drug: cycloSPORINE ?INT 1 GTT IN OU BID UTD ?  ?SUMAtriptan 25 MG tablet ?Commonly known as: IMITREX ?Take 25 mg by mouth once as needed for migraine (MAY REPEAT ONCE IN 2 HOURS IF NO RELIEF (MAX 2 TABLETS/24 HOURS)). ?  ?Vitamin D (Ergocalciferol) 1.25 MG (50000 UNIT) Caps capsule ?Commonly known as: DRISDOL ?Take 50,000 Units by mouth every 7 (seven) days. ?  ? ?  ? ? ?Allergies:  ?Allergies  ?Allergen Reactions  ? Penicillins Itching and Swelling  ?  Has patient had a PCN reaction causing immediate rash, facial/tongue/throat swelling, SOB or lightheadedness with hypotension: Yes ?Has patient had a PCN reaction causing severe rash involving mucus membranes or skin necrosis:  No ?Has patient had a PCN reaction that required hospitalization: No ?Has patient had a PCN reaction occurring within the last 10 years: No ?If all of the above answers are "NO", then may proceed with Cephalosporin use. ?  ? Valsartan Itching and Other (See Comments)  ?  Heart sped up  ? Amlodipine Itching  ?  Patient can take lower dose Norvasc   ? ? ?Past Medical History, Surgical history, Social history, and Family History were reviewed and updated. ? ?Review of Systems: ?All other 10 point review of systems is negative.  ? ?Physical Exam: ? weight is 109 lb 1.9 oz (49.5 kg). Her oral  temperature is 97.9 ?F (36.6 ?C). Her blood pressure is 137/41 (abnormal) and her pulse is 63. Her respiration is 17 and oxygen saturation is 100%.  ? ?Wt Readings from Last 3 Encounters:  ?08/03/21 109 lb 1.9 oz (49.5 kg)  ?06/22/21 106 lb (48.1 kg)  ?05/11/21 110 lb 12.8 oz (50.3 kg)  ? ? ?Ocular: Sclerae unicteric, pupils equal, round and reactive to light ?Ear-nose-throat: Oropharynx clear, dentition fair ?Lymphatic: No cervical or supraclavicular adenopathy ?Lungs no rales or rhonchi, good excursion bilaterally ?Heart regular rate and rhythm, no murmur appreciated ?Abd soft, nontender, positive bowel sounds ?MSK no focal spinal tenderness, no joint edema ?Neuro: non-focal, well-oriented, appropriate affect ?Breasts: Deferred  ? ?Lab Results  ?Component Value Date  ? WBC 3.8 (L) 08/03/2021  ? HGB 10.8 (L) 08/03/2021  ? HCT 34.1 (L) 08/03/2021  ? MCV 99.7 08/03/2021  ? PLT 128 (L) 08/03/2021  ? ?Lab Results  ?Component Value Date  ? FERRITIN 557 (H) 06/22/2021  ? IRON 96 06/22/2021  ? TIBC 266 06/22/2021  ? UIBC 170 06/22/2021  ? IRONPCTSAT 36 (H) 06/22/2021  ? ?Lab Results  ?Component Value Date  ? RETICCTPCT 1.4 08/03/2021  ? RBC 3.40 (L) 08/03/2021  ? RBC 3.42 (L) 08/03/2021  ? ?No results found for: KPAFRELGTCHN, LAMBDASER, KAPLAMBRATIO ?No results found for: IGGSERUM, IGA, IGMSERUM ?No results found for: TOTALPROTELP, ALBUMINELP, A1GS, A2GS, BETS, BETA2SER, GAMS, MSPIKE, SPEI ?  Chemistry   ?   ?Component Value Date/Time  ? NA 139 06/22/2021 1018  ? NA 145 02/13/2017 1046  ? NA 142 12/23/2016 0951  ? K 3.8 06/22/2021 1018  ? K 4.6 02/13/2017 1046  ? K 3.9 12/23/2016 0951  ? CL 103 06/22/2021 1018  ? CL 108 02/13/2017 1046  ? CO2 29 06/22/2021 1018  ? CO2 28 02/13/2017 1046  ? CO2 25 12/23/2016 0951  ? BUN 14 06/22/2021 1018  ? BUN 22 02/13/2017 1046  ? BUN 15.0 12/23/2016 0951  ? CREATININE 0.95 06/22/2021 1018  ? CREATININE 1.1 02/13/2017 1046  ? CREATININE 0.9 12/23/2016 0951  ?    ?Component Value  Date/Time  ? CALCIUM 9.8 06/22/2021 1018  ? CALCIUM 9.7 02/13/2017 1046  ? CALCIUM 9.5 12/23/2016 0951  ? ALKPHOS 64 06/22/2021 1018  ? ALKPHOS 60 02/13/2017 1046  ? ALKPHOS 69 12/23/2016 0951  ? AST 16 06/22/2021 1018  ? AST 26 12/23/2016 0951  ? ALT 8 06/22/2021 1018  ? ALT 18 02/13/2017 1046  ? ALT 16 12/23/2016 0951  ? BILITOT 0.9 06/22/2021 1018  ? BILITOT 0.96 12/23/2016 0951  ?  ? ? ? ?Impression and Plan: Ms. Hutt is a very pleasant 76 yo African American female with anemia of chronic renal insufficiency and erythropoietin deficiency. ?ESA given, Hgb 10.8.  ?Iron studies are pending. Follow-up in 6 weeks.  ? ?  Lottie Dawson, NP ?5/16/202310:15 AM ? ?

## 2021-08-03 NOTE — Patient Instructions (Signed)
[facility]  Discharge Instructions: ?Thank you for choosing [facility] to provide your oncology and hematology care.  ? ?If you have a lab appointment with the [facility], please go directly to the [facility] and check in at the registration area. ? ?Wear comfortable clothing and clothing appropriate for easy access to any Portacath or PICC line.  ? ?We strive to give you quality time with your provider. You may need to reschedule your appointment if you arrive late (15 or more minutes).  Arriving late affects you and other patients whose appointments are after yours.  Also, if you miss three or more appointments without notifying the office, you may be dismissed from the clinic at the provider?s discretion.    ?  ?For prescription refill requests, have your pharmacy contact our office and allow 72 hours for refills to be completed.   ? ?Today you received the following chemotherapy and/or immunotherapy agents Retacrit. ?  ?To help prevent nausea and vomiting after your treatment, we encourage you to take your nausea medication as directed. ? ?BELOW ARE SYMPTOMS THAT SHOULD BE REPORTED IMMEDIATELY: ?*FEVER GREATER THAN 100.4 F (38 ?C) OR HIGHER ?*CHILLS OR SWEATING ?*NAUSEA AND VOMITING THAT IS NOT CONTROLLED WITH YOUR NAUSEA MEDICATION ?*UNUSUAL SHORTNESS OF BREATH ?*UNUSUAL BRUISING OR BLEEDING ?*URINARY PROBLEMS (pain or burning when urinating, or frequent urination) ?*BOWEL PROBLEMS (unusual diarrhea, constipation, pain near the anus) ?TENDERNESS IN MOUTH AND THROAT WITH OR WITHOUT PRESENCE OF ULCERS (sore throat, sores in mouth, or a toothache) ?UNUSUAL RASH, SWELLING OR PAIN  ?UNUSUAL VAGINAL DISCHARGE OR ITCHING  ? ?Items with * indicate a potential emergency and should be followed up as soon as possible or go to the Emergency Department if any problems should occur. ? ?Please show the CHEMOTHERAPY ALERT CARD or IMMUNOTHERAPY ALERT CARD at check-in to the  Emergency Department and triage nurse. ?Should you have questions after your visit or need to cancel or reschedule your appointment, please contact [facility]  [phone] and follow the prompts.  Office hours are 8:00 a.m. to 4:30 p.m. Monday - Friday. Please note that voicemails left after 4:00 p.m. may not be returned until the following business day.  We are closed weekends and major holidays. You have access to a nurse at all times for urgent questions. Please call the main number to the clinic [phone] and follow the prompts. ? ?For any non-urgent questions, you may also contact your provider using MyChart. We now offer e-Visits for anyone 15 and older to request care online for non-urgent symptoms. For details visit [email or web address]. ?  ?Also download the MyChart app! Go to the app store, search "MyChart", open the app, select [facility], and log in with your MyChart username and password. ? ?Due to Covid, a mask is required upon entering the hospital/clinic. If you do not have a mask, one will be given to you upon arrival. For doctor visits, patients may have 1 support person aged 49 or older with them. For treatment visits, patients cannot have anyone with them due to current Covid guidelines and our immunocompromised population.  ?

## 2021-09-14 ENCOUNTER — Other Ambulatory Visit: Payer: Self-pay

## 2021-09-14 ENCOUNTER — Encounter: Payer: Self-pay | Admitting: Family

## 2021-09-14 ENCOUNTER — Inpatient Hospital Stay: Payer: Medicare Other | Admitting: Family

## 2021-09-14 ENCOUNTER — Inpatient Hospital Stay: Payer: Medicare Other

## 2021-09-14 ENCOUNTER — Inpatient Hospital Stay: Payer: Medicare Other | Attending: Family

## 2021-09-14 VITALS — BP 139/58 | HR 61 | Temp 98.1°F | Resp 20 | Ht 62.0 in | Wt 110.0 lb

## 2021-09-14 DIAGNOSIS — D500 Iron deficiency anemia secondary to blood loss (chronic): Secondary | ICD-10-CM

## 2021-09-14 DIAGNOSIS — N189 Chronic kidney disease, unspecified: Secondary | ICD-10-CM | POA: Insufficient documentation

## 2021-09-14 DIAGNOSIS — D631 Anemia in chronic kidney disease: Secondary | ICD-10-CM

## 2021-09-14 LAB — IRON AND IRON BINDING CAPACITY (CC-WL,HP ONLY)
Iron: 81 ug/dL (ref 28–170)
Saturation Ratios: 31 % (ref 10.4–31.8)
TIBC: 265 ug/dL (ref 250–450)
UIBC: 184 ug/dL (ref 148–442)

## 2021-09-14 LAB — CBC WITH DIFFERENTIAL (CANCER CENTER ONLY)
Abs Immature Granulocytes: 0.03 10*3/uL (ref 0.00–0.07)
Basophils Absolute: 0 10*3/uL (ref 0.0–0.1)
Basophils Relative: 1 %
Eosinophils Absolute: 0.1 10*3/uL (ref 0.0–0.5)
Eosinophils Relative: 2 %
HCT: 33.1 % — ABNORMAL LOW (ref 36.0–46.0)
Hemoglobin: 10.6 g/dL — ABNORMAL LOW (ref 12.0–15.0)
Immature Granulocytes: 1 %
Lymphocytes Relative: 31 %
Lymphs Abs: 1.1 10*3/uL (ref 0.7–4.0)
MCH: 31.9 pg (ref 26.0–34.0)
MCHC: 32 g/dL (ref 30.0–36.0)
MCV: 99.7 fL (ref 80.0–100.0)
Monocytes Absolute: 0.4 10*3/uL (ref 0.1–1.0)
Monocytes Relative: 12 %
Neutro Abs: 1.9 10*3/uL (ref 1.7–7.7)
Neutrophils Relative %: 53 %
Platelet Count: 132 10*3/uL — ABNORMAL LOW (ref 150–400)
RBC: 3.32 MIL/uL — ABNORMAL LOW (ref 3.87–5.11)
RDW: 13 % (ref 11.5–15.5)
WBC Count: 3.6 10*3/uL — ABNORMAL LOW (ref 4.0–10.5)
nRBC: 0 % (ref 0.0–0.2)

## 2021-09-14 LAB — RETICULOCYTES
Immature Retic Fract: 7.4 % (ref 2.3–15.9)
RBC.: 3.3 MIL/uL — ABNORMAL LOW (ref 3.87–5.11)
Retic Count, Absolute: 49.2 10*3/uL (ref 19.0–186.0)
Retic Ct Pct: 1.5 % (ref 0.4–3.1)

## 2021-09-14 LAB — CMP (CANCER CENTER ONLY)
ALT: 10 U/L (ref 0–44)
AST: 18 U/L (ref 15–41)
Albumin: 4.1 g/dL (ref 3.5–5.0)
Alkaline Phosphatase: 57 U/L (ref 38–126)
Anion gap: 6 (ref 5–15)
BUN: 21 mg/dL (ref 8–23)
CO2: 28 mmol/L (ref 22–32)
Calcium: 9.6 mg/dL (ref 8.9–10.3)
Chloride: 106 mmol/L (ref 98–111)
Creatinine: 0.9 mg/dL (ref 0.44–1.00)
GFR, Estimated: >60 mL/min (ref >60)
Glucose, Bld: 127 mg/dL — ABNORMAL HIGH (ref 70–99)
Potassium: 4.1 mmol/L (ref 3.5–5.1)
Sodium: 140 mmol/L (ref 135–145)
Total Bilirubin: 0.9 mg/dL (ref 0.3–1.2)
Total Protein: 7.2 g/dL (ref 6.5–8.1)

## 2021-09-14 LAB — FERRITIN: Ferritin: 332 ng/mL — ABNORMAL HIGH (ref 11–307)

## 2021-09-14 MED ORDER — EPOETIN ALFA-EPBX 40000 UNIT/ML IJ SOLN
40000.0000 [IU] | Freq: Once | INTRAMUSCULAR | Status: AC
  Administered 2021-09-14: 40000 [IU] via SUBCUTANEOUS
  Filled 2021-09-14: qty 1

## 2021-11-09 ENCOUNTER — Telehealth: Payer: Self-pay | Admitting: *Deleted

## 2021-11-09 ENCOUNTER — Encounter: Payer: Self-pay | Admitting: Family

## 2021-11-09 ENCOUNTER — Inpatient Hospital Stay: Payer: Medicare Other | Admitting: Family

## 2021-11-09 ENCOUNTER — Inpatient Hospital Stay: Payer: Medicare Other | Attending: Family

## 2021-11-09 ENCOUNTER — Inpatient Hospital Stay: Payer: Medicare Other

## 2021-11-09 VITALS — BP 147/55 | HR 53 | Temp 98.2°F | Resp 17 | Wt 107.8 lb

## 2021-11-09 DIAGNOSIS — N183 Chronic kidney disease, stage 3 unspecified: Secondary | ICD-10-CM | POA: Insufficient documentation

## 2021-11-09 DIAGNOSIS — D509 Iron deficiency anemia, unspecified: Secondary | ICD-10-CM | POA: Insufficient documentation

## 2021-11-09 DIAGNOSIS — R202 Paresthesia of skin: Secondary | ICD-10-CM | POA: Insufficient documentation

## 2021-11-09 DIAGNOSIS — I129 Hypertensive chronic kidney disease with stage 1 through stage 4 chronic kidney disease, or unspecified chronic kidney disease: Secondary | ICD-10-CM | POA: Diagnosis present

## 2021-11-09 DIAGNOSIS — D500 Iron deficiency anemia secondary to blood loss (chronic): Secondary | ICD-10-CM

## 2021-11-09 DIAGNOSIS — R059 Cough, unspecified: Secondary | ICD-10-CM | POA: Insufficient documentation

## 2021-11-09 DIAGNOSIS — D631 Anemia in chronic kidney disease: Secondary | ICD-10-CM | POA: Diagnosis not present

## 2021-11-09 DIAGNOSIS — Z79899 Other long term (current) drug therapy: Secondary | ICD-10-CM | POA: Diagnosis not present

## 2021-11-09 LAB — CBC WITH DIFFERENTIAL (CANCER CENTER ONLY)
Abs Immature Granulocytes: 0.03 10*3/uL (ref 0.00–0.07)
Basophils Absolute: 0.1 10*3/uL (ref 0.0–0.1)
Basophils Relative: 1 %
Eosinophils Absolute: 0.2 10*3/uL (ref 0.0–0.5)
Eosinophils Relative: 3 %
HCT: 30.5 % — ABNORMAL LOW (ref 36.0–46.0)
Hemoglobin: 9.8 g/dL — ABNORMAL LOW (ref 12.0–15.0)
Immature Granulocytes: 1 %
Lymphocytes Relative: 32 %
Lymphs Abs: 1.5 10*3/uL (ref 0.7–4.0)
MCH: 31.6 pg (ref 26.0–34.0)
MCHC: 32.1 g/dL (ref 30.0–36.0)
MCV: 98.4 fL (ref 80.0–100.0)
Monocytes Absolute: 0.6 10*3/uL (ref 0.1–1.0)
Monocytes Relative: 13 %
Neutro Abs: 2.4 10*3/uL (ref 1.7–7.7)
Neutrophils Relative %: 50 %
Platelet Count: 174 10*3/uL (ref 150–400)
RBC: 3.1 MIL/uL — ABNORMAL LOW (ref 3.87–5.11)
RDW: 13 % (ref 11.5–15.5)
WBC Count: 4.7 10*3/uL (ref 4.0–10.5)
nRBC: 0 % (ref 0.0–0.2)

## 2021-11-09 LAB — RETICULOCYTES
Immature Retic Fract: 11.2 % (ref 2.3–15.9)
RBC.: 3.06 MIL/uL — ABNORMAL LOW (ref 3.87–5.11)
Retic Count, Absolute: 62.4 10*3/uL (ref 19.0–186.0)
Retic Ct Pct: 2 % (ref 0.4–3.1)

## 2021-11-09 LAB — CMP (CANCER CENTER ONLY)
ALT: 10 U/L (ref 0–44)
AST: 18 U/L (ref 15–41)
Albumin: 4.1 g/dL (ref 3.5–5.0)
Alkaline Phosphatase: 48 U/L (ref 38–126)
Anion gap: 6 (ref 5–15)
BUN: 16 mg/dL (ref 8–23)
CO2: 28 mmol/L (ref 22–32)
Calcium: 10.1 mg/dL (ref 8.9–10.3)
Chloride: 105 mmol/L (ref 98–111)
Creatinine: 1.02 mg/dL — ABNORMAL HIGH (ref 0.44–1.00)
GFR, Estimated: 57 mL/min — ABNORMAL LOW (ref >60)
Glucose, Bld: 100 mg/dL — ABNORMAL HIGH (ref 70–99)
Potassium: 4.3 mmol/L (ref 3.5–5.1)
Sodium: 139 mmol/L (ref 135–145)
Total Bilirubin: 1 mg/dL (ref 0.3–1.2)
Total Protein: 7.6 g/dL (ref 6.5–8.1)

## 2021-11-09 LAB — IRON AND IRON BINDING CAPACITY (CC-WL,HP ONLY)
Iron: 75 ug/dL (ref 28–170)
Saturation Ratios: 29 % (ref 10.4–31.8)
TIBC: 263 ug/dL (ref 250–450)
UIBC: 188 ug/dL (ref 148–442)

## 2021-11-09 LAB — FERRITIN: Ferritin: 469 ng/mL — ABNORMAL HIGH (ref 11–307)

## 2021-11-09 MED ORDER — EPOETIN ALFA-EPBX 40000 UNIT/ML IJ SOLN
40000.0000 [IU] | Freq: Once | INTRAMUSCULAR | Status: AC
  Administered 2021-11-09: 40000 [IU] via SUBCUTANEOUS
  Filled 2021-11-09: qty 1

## 2021-11-09 NOTE — Progress Notes (Signed)
Hematology and Oncology Follow Up Visit  Rachael Davenport [id number] 03-May-1945 77 y.o. 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 She is getting over a bout with bronchitis. She has a dry cough.  She states that she recently completed a round of oral antibiotics and is still on cough syrup. Repeat chest xray last week was negative.  No fever, chills, n/v, rash, dizziness, SOB, chest pain, palpitations, abdominal pain or changes in bowel or bladder habits.  No blood loss noted. No bruising or petechiae.  No swelling or tenderness in her extremities.  Tingling in her fingers is unchanged from baseline.  Appetite and hydration are good. Weight is stable at 107 lbs.   ECOG Performance Status: 1 - Symptomatic but completely ambulatory  Medications:  Allergies as of 11/09/2021       Reactions   Penicillins Itching, Swelling   Has patient had a PCN reaction causing immediate rash, facial/tongue/throat swelling, SOB or lightheadedness with hypotension: Yes Has patient had a PCN reaction causing severe rash involving mucus membranes or skin necrosis: No Has patient had a PCN reaction that required hospitalization: No Has patient had a PCN reaction occurring within the last 10 years: No If all of the above answers are "NO", then may proceed with Cephalosporin use.   Valsartan Itching, Other (See Comments)   Heart sped up   Amlodipine Itching   Patient can take lower dose Norvasc         Medication List        Accurate as of November 09, 2021 10:22 AM. If you have any questions, ask your nurse or doctor.          amLODipine 2.5 MG tablet Commonly known as: NORVASC Take 2.5 mg by mouth daily.   azithromycin  250 MG tablet Commonly known as: ZITHROMAX Take by mouth.   benzonatate 100 MG capsule Commonly known as: TESSALON Take 1 capsule by mouth 3 (three) times daily as needed.   carvedilol 3.125 MG tablet Commonly known as: COREG Take 3.125 mg by mouth 2 (two) times daily.   Estradiol 4 MCG Inst Imvexxy Maintenance Pack 4 mcg vaginal insert  Insert 1 vaginal insert twice a week by vaginal route.   furosemide 20 MG tablet Commonly known as: LASIX Take by mouth.   polyethylene glycol 17 g packet Commonly known as: MIRALAX / GLYCOLAX Take 17 g by mouth daily as needed.   pravastatin 10 MG tablet Commonly known as: PRAVACHOL pravastatin 10 mg tablet   Restasis 0.05 % ophthalmic emulsion Generic drug: cycloSPORINE INT 1 GTT IN OU BID UTD   SUMAtriptan 25 MG tablet Commonly known as: IMITREX Take 25 mg by mouth once as needed for migraine (MAY REPEAT ONCE IN 2 HOURS IF NO RELIEF (MAX 2 TABLETS/24 HOURS)).   Vitamin D (Ergocalciferol) 1.25 MG (50000 UNIT) Caps capsule Commonly known as: DRISDOL Take 50,000 Units by mouth every 7 (seven) days.        Allergies:  Allergies  Allergen Reactions   Penicillins Itching and Swelling    Has patient had a PCN reaction causing immediate rash, facial/tongue/throat swelling, SOB or lightheadedness with hypotension: Yes Has patient had a PCN reaction causing severe rash  involving mucus membranes or skin necrosis: No Has patient had a PCN reaction that required hospitalization: No Has patient had a PCN reaction occurring within the last 10 years: No If all of the above answers are "NO", then may proceed with Cephalosporin use.    Valsartan Itching and Other (See Comments)    Heart sped up   Amlodipine Itching    Patient can take lower dose Norvasc     Past Medical History, Surgical history, Social history, and Family History were reviewed and updated.  Review of Systems: All other 10 point review of systems is negative.    Physical Exam:  weight is 107 lb 12.8 oz (48.9 kg). Her oral temperature is 98.2 F (36.8 C). Her blood pressure is 147/55 (abnormal) and her pulse is 53 (abnormal). Her respiration is 17 and oxygen saturation is 100%.   Wt Readings from Last 3 Encounters:  11/09/21 107 lb 12.8 oz (48.9 kg)  09/14/21 110 lb (49.9 kg)  08/03/21 109 lb 1.9 oz (49.5 kg)    Ocular: Sclerae unicteric, pupils equal, round and reactive to light Ear-nose-throat: Oropharynx clear, dentition fair Lymphatic: No cervical or supraclavicular adenopathy Lungs no rales or rhonchi, good excursion bilaterally Heart regular rate and rhythm, no murmur appreciated Abd soft, nontender, positive bowel sounds MSK no focal spinal tenderness, no joint edema Neuro: non-focal, well-oriented, appropriate affect Breasts: Deferred   Lab Results  Component Value Date   WBC 4.7 11/09/2021   HGB 9.8 (L) 11/09/2021   HCT 30.5 (L) 11/09/2021   MCV 98.4 11/09/2021   PLT 174 11/09/2021   Lab Results  Component Value Date   FERRITIN 332 (H) 09/14/2021   IRON 81 09/14/2021   TIBC 265 09/14/2021   UIBC 184 09/14/2021   IRONPCTSAT 31 09/14/2021   Lab Results  Component Value Date   RETICCTPCT 2.0 11/09/2021   RBC 3.10 (L) 11/09/2021   RBC 3.06 (L) 11/09/2021   No results found for: "KPAFRELGTCHN", "LAMBDASER", "KAPLAMBRATIO" No results found for: "IGGSERUM", "IGA", "IGMSERUM" No results found for: "TOTALPROTELP", "ALBUMINELP", "A1GS", "A2GS", "BETS", "BETA2SER", "GAMS", "MSPIKE", "SPEI"   Chemistry      Component Value Date/Time   NA 140 09/14/2021 0838   NA 145 02/13/2017 1046   NA 142 12/23/2016 0951   K 4.1 09/14/2021 0838   K 4.6 02/13/2017 1046   K 3.9 12/23/2016 0951   CL 106 09/14/2021 0838   CL 108 02/13/2017 1046   CO2 28 09/14/2021 0838   CO2 28 02/13/2017 1046   CO2 25 12/23/2016 0951   BUN 21 09/14/2021 0838   BUN 22 02/13/2017 1046   BUN 15.0 12/23/2016 0951   CREATININE 0.90 09/14/2021 0838    CREATININE 1.1 02/13/2017 1046   CREATININE 0.9 12/23/2016 0951      Component Value Date/Time   CALCIUM 9.6 09/14/2021 0838   CALCIUM 9.7 02/13/2017 1046   CALCIUM 9.5 12/23/2016 0951   ALKPHOS 57 09/14/2021 0838   ALKPHOS 60 02/13/2017 1046   ALKPHOS 69 12/23/2016 0951   AST 18 09/14/2021 0838   AST 26 12/23/2016 0951   ALT 10 09/14/2021 0838   ALT 18 02/13/2017 1046   ALT 16 12/23/2016 0951   BILITOT 0.9 09/14/2021 0838   BILITOT 0.96 12/23/2016 0951       Impression and Plan: Rachael Davenport is a very pleasant 76 yo African American female with anemia of chronic renal insufficiency and erythropoietin deficiency. ESA given, Hgb 9.8.  Iron studies pending.  Lab and  injection in 3 weeks, follow-up in 6 weeks.   Lottie Dawson, NP 8/22/202310:22 AM

## 2021-11-09 NOTE — Patient Instructions (Signed)

## 2021-11-09 NOTE — Telephone Encounter (Signed)
Per 11/09/21 los - gave upcoming appointments - confirmed 

## 2021-11-30 ENCOUNTER — Inpatient Hospital Stay: Payer: Medicare Other

## 2021-11-30 ENCOUNTER — Inpatient Hospital Stay: Payer: Medicare Other | Attending: Family

## 2021-11-30 DIAGNOSIS — N189 Chronic kidney disease, unspecified: Secondary | ICD-10-CM | POA: Diagnosis not present

## 2021-11-30 DIAGNOSIS — I129 Hypertensive chronic kidney disease with stage 1 through stage 4 chronic kidney disease, or unspecified chronic kidney disease: Secondary | ICD-10-CM | POA: Insufficient documentation

## 2021-11-30 DIAGNOSIS — D500 Iron deficiency anemia secondary to blood loss (chronic): Secondary | ICD-10-CM

## 2021-11-30 DIAGNOSIS — D631 Anemia in chronic kidney disease: Secondary | ICD-10-CM | POA: Diagnosis not present

## 2021-11-30 LAB — CBC WITH DIFFERENTIAL (CANCER CENTER ONLY)
Abs Immature Granulocytes: 0.01 10*3/uL (ref 0.00–0.07)
Basophils Absolute: 0 10*3/uL (ref 0.0–0.1)
Basophils Relative: 1 %
Eosinophils Absolute: 0.1 10*3/uL (ref 0.0–0.5)
Eosinophils Relative: 3 %
HCT: 34.7 % — ABNORMAL LOW (ref 36.0–46.0)
Hemoglobin: 11 g/dL — ABNORMAL LOW (ref 12.0–15.0)
Immature Granulocytes: 0 %
Lymphocytes Relative: 43 %
Lymphs Abs: 1.5 10*3/uL (ref 0.7–4.0)
MCH: 32.1 pg (ref 26.0–34.0)
MCHC: 31.7 g/dL (ref 30.0–36.0)
MCV: 101.2 fL — ABNORMAL HIGH (ref 80.0–100.0)
Monocytes Absolute: 0.4 10*3/uL (ref 0.1–1.0)
Monocytes Relative: 10 %
Neutro Abs: 1.6 10*3/uL — ABNORMAL LOW (ref 1.7–7.7)
Neutrophils Relative %: 43 %
Platelet Count: 111 10*3/uL — ABNORMAL LOW (ref 150–400)
RBC: 3.43 MIL/uL — ABNORMAL LOW (ref 3.87–5.11)
RDW: 13.5 % (ref 11.5–15.5)
WBC Count: 3.6 10*3/uL — ABNORMAL LOW (ref 4.0–10.5)
nRBC: 0 % (ref 0.0–0.2)

## 2021-11-30 LAB — CMP (CANCER CENTER ONLY)
ALT: 11 U/L (ref 0–44)
AST: 20 U/L (ref 15–41)
Albumin: 4.3 g/dL (ref 3.5–5.0)
Alkaline Phosphatase: 55 U/L (ref 38–126)
Anion gap: 7 (ref 5–15)
BUN: 23 mg/dL (ref 8–23)
CO2: 29 mmol/L (ref 22–32)
Calcium: 10 mg/dL (ref 8.9–10.3)
Chloride: 105 mmol/L (ref 98–111)
Creatinine: 0.94 mg/dL (ref 0.44–1.00)
GFR, Estimated: >60 mL/min (ref >60)
Glucose, Bld: 94 mg/dL (ref 70–99)
Potassium: 4.3 mmol/L (ref 3.5–5.1)
Sodium: 141 mmol/L (ref 135–145)
Total Bilirubin: 1.3 mg/dL — ABNORMAL HIGH (ref 0.3–1.2)
Total Protein: 7.3 g/dL (ref 6.5–8.1)

## 2021-12-21 ENCOUNTER — Inpatient Hospital Stay: Payer: Medicare Other | Attending: Family

## 2021-12-21 ENCOUNTER — Other Ambulatory Visit: Payer: Self-pay

## 2021-12-21 ENCOUNTER — Inpatient Hospital Stay: Payer: Medicare Other | Admitting: Family

## 2021-12-21 ENCOUNTER — Inpatient Hospital Stay: Payer: Medicare Other

## 2021-12-21 ENCOUNTER — Encounter: Payer: Self-pay | Admitting: Family

## 2021-12-21 VITALS — BP 142/63 | HR 52 | Temp 97.8°F | Resp 18 | Ht 62.0 in | Wt 108.8 lb

## 2021-12-21 DIAGNOSIS — R5383 Other fatigue: Secondary | ICD-10-CM | POA: Insufficient documentation

## 2021-12-21 DIAGNOSIS — D631 Anemia in chronic kidney disease: Secondary | ICD-10-CM | POA: Diagnosis not present

## 2021-12-21 DIAGNOSIS — N189 Chronic kidney disease, unspecified: Secondary | ICD-10-CM | POA: Diagnosis not present

## 2021-12-21 DIAGNOSIS — D509 Iron deficiency anemia, unspecified: Secondary | ICD-10-CM | POA: Diagnosis not present

## 2021-12-21 DIAGNOSIS — Z79899 Other long term (current) drug therapy: Secondary | ICD-10-CM | POA: Insufficient documentation

## 2021-12-21 DIAGNOSIS — N184 Chronic kidney disease, stage 4 (severe): Secondary | ICD-10-CM | POA: Insufficient documentation

## 2021-12-21 DIAGNOSIS — Z87442 Personal history of urinary calculi: Secondary | ICD-10-CM | POA: Diagnosis not present

## 2021-12-21 DIAGNOSIS — D500 Iron deficiency anemia secondary to blood loss (chronic): Secondary | ICD-10-CM | POA: Diagnosis not present

## 2021-12-21 DIAGNOSIS — I129 Hypertensive chronic kidney disease with stage 1 through stage 4 chronic kidney disease, or unspecified chronic kidney disease: Secondary | ICD-10-CM | POA: Diagnosis present

## 2021-12-21 LAB — CBC WITH DIFFERENTIAL (CANCER CENTER ONLY)
Abs Immature Granulocytes: 0.04 10*3/uL (ref 0.00–0.07)
Basophils Absolute: 0.1 10*3/uL (ref 0.0–0.1)
Basophils Relative: 1 %
Eosinophils Absolute: 0.1 10*3/uL (ref 0.0–0.5)
Eosinophils Relative: 2 %
HCT: 32.5 % — ABNORMAL LOW (ref 36.0–46.0)
Hemoglobin: 10.5 g/dL — ABNORMAL LOW (ref 12.0–15.0)
Immature Granulocytes: 1 %
Lymphocytes Relative: 44 %
Lymphs Abs: 1.9 10*3/uL (ref 0.7–4.0)
MCH: 31.9 pg (ref 26.0–34.0)
MCHC: 32.3 g/dL (ref 30.0–36.0)
MCV: 98.8 fL (ref 80.0–100.0)
Monocytes Absolute: 0.5 10*3/uL (ref 0.1–1.0)
Monocytes Relative: 10 %
Neutro Abs: 1.8 10*3/uL (ref 1.7–7.7)
Neutrophils Relative %: 42 %
Platelet Count: 133 10*3/uL — ABNORMAL LOW (ref 150–400)
RBC: 3.29 MIL/uL — ABNORMAL LOW (ref 3.87–5.11)
RDW: 12.8 % (ref 11.5–15.5)
WBC Count: 4.3 10*3/uL (ref 4.0–10.5)
nRBC: 0 % (ref 0.0–0.2)

## 2021-12-21 LAB — CMP (CANCER CENTER ONLY)
ALT: 13 U/L (ref 0–44)
AST: 24 U/L (ref 15–41)
Albumin: 3.8 g/dL (ref 3.5–5.0)
Alkaline Phosphatase: 58 U/L (ref 38–126)
Anion gap: 6 (ref 5–15)
BUN: 20 mg/dL (ref 8–23)
CO2: 27 mmol/L (ref 22–32)
Calcium: 9.7 mg/dL (ref 8.9–10.3)
Chloride: 109 mmol/L (ref 98–111)
Creatinine: 0.94 mg/dL (ref 0.44–1.00)
GFR, Estimated: >60 mL/min (ref >60)
Glucose, Bld: 100 mg/dL — ABNORMAL HIGH (ref 70–99)
Potassium: 4.5 mmol/L (ref 3.5–5.1)
Sodium: 142 mmol/L (ref 135–145)
Total Bilirubin: 1 mg/dL (ref 0.3–1.2)
Total Protein: 7.2 g/dL (ref 6.5–8.1)

## 2021-12-21 LAB — RETICULOCYTES
Immature Retic Fract: 8.8 % (ref 2.3–15.9)
RBC.: 3.31 MIL/uL — ABNORMAL LOW (ref 3.87–5.11)
Retic Count, Absolute: 48.7 10*3/uL (ref 19.0–186.0)
Retic Ct Pct: 1.5 % (ref 0.4–3.1)

## 2021-12-21 LAB — IRON AND IRON BINDING CAPACITY (CC-WL,HP ONLY)
Iron: 87 ug/dL (ref 28–170)
Saturation Ratios: 33 % — ABNORMAL HIGH (ref 10.4–31.8)
TIBC: 266 ug/dL (ref 250–450)
UIBC: 179 ug/dL (ref 148–442)

## 2021-12-21 LAB — FERRITIN: Ferritin: 324 ng/mL — ABNORMAL HIGH (ref 11–307)

## 2021-12-21 MED ORDER — EPOETIN ALFA-EPBX 40000 UNIT/ML IJ SOLN
40000.0000 [IU] | Freq: Once | INTRAMUSCULAR | Status: AC
  Administered 2021-12-21: 40000 [IU] via SUBCUTANEOUS
  Filled 2021-12-21: qty 1

## 2021-12-21 NOTE — Patient Instructions (Signed)

## 2021-12-21 NOTE — Progress Notes (Signed)
Hematology and Oncology Follow Up Visit  Rachael Davenport [id number] 15-Aug-1945 76 y.o. 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 She is doing well but states that she has had issues with recurrent UTI's and has to have her pessary replaced on 01/11/2022. She states that she also has an appointment with uro gynecology in January 2024. She states that she may need to have a hysterectomy to help resolve her issues fully.  She has intermittent fatigue.  She has not noted any blood loss. No bruising or petechiae.  No fever, chills, n/v, cough, rash, dizziness, SOB, chest pain, palpitations, abdominal pain or changes in bowel habits.  No swelling, tenderness, numbness or tingling in her extremities at this time.  No falls or syncope.  Appetite and hydration are good. Weight is stable at 108 lbs.   ECOG Performance Status: 1 - Symptomatic but completely ambulatory  Medications:  Allergies as of 12/21/2021       Reactions   Penicillins Itching, Swelling   Has patient had a PCN reaction causing immediate rash, facial/tongue/throat swelling, SOB or lightheadedness with hypotension: Yes Has patient had a PCN reaction causing severe rash involving mucus membranes or skin necrosis: No Has patient had a PCN reaction that required hospitalization: No Has patient had a PCN reaction occurring within the last 10 years: No If all of the above answers are "NO", then may proceed with Cephalosporin use.   Valsartan Itching, Other (See Comments)   Heart sped up   Amlodipine Itching   Patient can take lower dose Norvasc         Medication List        Accurate as of December 21, 2021 10:03 AM. If you have any questions, ask your nurse or  doctor.          STOP taking these medications    azithromycin 250 MG tablet Commonly known as: ZITHROMAX Stopped by: Lottie Dawson, NP   benzonatate 100 MG capsule Commonly known as: TESSALON Stopped by: Lottie Dawson, NP       TAKE these medications    amLODipine 2.5 MG tablet Commonly known as: NORVASC Take 2.5 mg by mouth daily.   carvedilol 3.125 MG tablet Commonly known as: COREG Take 3.125 mg by mouth 2 (two) times daily.   Estradiol 4 MCG Inst Imvexxy Maintenance Pack 4 mcg vaginal insert  Insert 1 vaginal insert twice a week by vaginal route.   furosemide 20 MG tablet Commonly known as: LASIX Take by mouth.   polyethylene glycol 17 g packet Commonly known as: MIRALAX / GLYCOLAX Take 17 g by mouth daily as needed.   pravastatin 10 MG tablet Commonly known as: PRAVACHOL pravastatin 10 mg tablet   Restasis 0.05 % ophthalmic emulsion Generic drug: cycloSPORINE INT 1 GTT IN OU BID UTD   SUMAtriptan 25 MG tablet Commonly known as: IMITREX Take 25 mg by mouth once as needed for migraine (MAY REPEAT ONCE IN 2 HOURS IF NO RELIEF (MAX 2 TABLETS/24 HOURS)).   Vitamin D (Ergocalciferol) 1.25 MG (50000 UNIT) Caps capsule Commonly known as: DRISDOL Take 50,000 Units by mouth every 7 (seven) days.        Allergies:  Allergies  Allergen Reactions  Penicillins Itching and Swelling    Has patient had a PCN reaction causing immediate rash, facial/tongue/throat swelling, SOB or lightheadedness with hypotension: Yes Has patient had a PCN reaction causing severe rash involving mucus membranes or skin necrosis: No Has patient had a PCN reaction that required hospitalization: No Has patient had a PCN reaction occurring within the last 10 years: No If all of the above answers are "NO", then may proceed with Cephalosporin use.    Valsartan Itching and Other (See Comments)    Heart sped up   Amlodipine Itching    Patient can take lower dose Norvasc     Past  Medical History, Surgical history, Social history, and Family History were reviewed and updated.  Review of Systems: All other 10 point review of systems is negative.   Physical Exam:  height is 5\' 2"  (1.575 m) and weight is 108 lb 12.8 oz (49.4 kg). Her oral temperature is 97.8 F (36.6 C). Her blood pressure is 142/63 (abnormal) and her pulse is 52 (abnormal). Her respiration is 18 and oxygen saturation is 100%.   Wt Readings from Last 3 Encounters:  12/21/21 108 lb 12.8 oz (49.4 kg)  11/09/21 107 lb 12.8 oz (48.9 kg)  09/14/21 110 lb (49.9 kg)    Ocular: Sclerae unicteric, pupils equal, round and reactive to light Ear-nose-throat: Oropharynx clear, dentition fair Lymphatic: No cervical or supraclavicular adenopathy Lungs no rales or rhonchi, good excursion bilaterally Heart regular rate and rhythm, no murmur appreciated Abd soft, nontender, positive bowel sounds MSK no focal spinal tenderness, no joint edema Neuro: non-focal, well-oriented, appropriate affect Breasts: Deferred   Lab Results  Component Value Date   WBC 4.3 12/21/2021   HGB 10.5 (L) 12/21/2021   HCT 32.5 (L) 12/21/2021   MCV 98.8 12/21/2021   PLT 133 (L) 12/21/2021   Lab Results  Component Value Date   FERRITIN 469 (H) 11/09/2021   IRON 75 11/09/2021   TIBC 263 11/09/2021   UIBC 188 11/09/2021   IRONPCTSAT 29 11/09/2021   Lab Results  Component Value Date   RETICCTPCT 1.5 12/21/2021   RBC 3.31 (L) 12/21/2021   No results found for: "KPAFRELGTCHN", "LAMBDASER", "KAPLAMBRATIO" No results found for: "IGGSERUM", "IGA", "IGMSERUM" No results found for: "TOTALPROTELP", "ALBUMINELP", "A1GS", "A2GS", "BETS", "BETA2SER", "GAMS", "MSPIKE", "SPEI"   Chemistry      Component Value Date/Time   NA 142 12/21/2021 0833   NA 145 02/13/2017 1046   NA 142 12/23/2016 0951   K 4.5 12/21/2021 0833   K 4.6 02/13/2017 1046   K 3.9 12/23/2016 0951   CL 109 12/21/2021 0833   CL 108 02/13/2017 1046   CO2 27  12/21/2021 0833   CO2 28 02/13/2017 1046   CO2 25 12/23/2016 0951   BUN 20 12/21/2021 0833   BUN 22 02/13/2017 1046   BUN 15.0 12/23/2016 0951   CREATININE 0.94 12/21/2021 0833   CREATININE 1.1 02/13/2017 1046   CREATININE 0.9 12/23/2016 0951      Component Value Date/Time   CALCIUM 9.7 12/21/2021 0833   CALCIUM 9.7 02/13/2017 1046   CALCIUM 9.5 12/23/2016 0951   ALKPHOS 58 12/21/2021 0833   ALKPHOS 60 02/13/2017 1046   ALKPHOS 69 12/23/2016 0951   AST 24 12/21/2021 0833   AST 26 12/23/2016 0951   ALT 13 12/21/2021 0833   ALT 18 02/13/2017 1046   ALT 16 12/23/2016 0951   BILITOT 1.0 12/21/2021 0833   BILITOT 0.96 12/23/2016 0951  Impression and Plan: Ms. Dancy is a very pleasant 76 yo African American female with anemia of chronic renal insufficiency and erythropoietin deficiency. ESA given today for Hgb 10.8.  Iron studies are pending.  Lab and injection every 3 weeks.  Follow-up in 6 weeks.   Lottie Dawson, NP 10/3/202310:03 AM

## 2021-12-22 ENCOUNTER — Encounter: Payer: Self-pay | Admitting: Family

## 2021-12-27 ENCOUNTER — Other Ambulatory Visit: Payer: Self-pay | Admitting: Obstetrics and Gynecology

## 2021-12-27 DIAGNOSIS — Z1231 Encounter for screening mammogram for malignant neoplasm of breast: Secondary | ICD-10-CM

## 2022-01-11 ENCOUNTER — Inpatient Hospital Stay: Payer: Medicare Other

## 2022-01-11 DIAGNOSIS — D500 Iron deficiency anemia secondary to blood loss (chronic): Secondary | ICD-10-CM

## 2022-01-11 DIAGNOSIS — N189 Chronic kidney disease, unspecified: Secondary | ICD-10-CM

## 2022-01-11 DIAGNOSIS — I129 Hypertensive chronic kidney disease with stage 1 through stage 4 chronic kidney disease, or unspecified chronic kidney disease: Secondary | ICD-10-CM | POA: Diagnosis not present

## 2022-01-11 DIAGNOSIS — D631 Anemia in chronic kidney disease: Secondary | ICD-10-CM

## 2022-01-11 LAB — CBC WITH DIFFERENTIAL (CANCER CENTER ONLY)
Abs Immature Granulocytes: 0 10*3/uL (ref 0.00–0.07)
Basophils Absolute: 0.1 10*3/uL (ref 0.0–0.1)
Basophils Relative: 1 %
Eosinophils Absolute: 0.1 10*3/uL (ref 0.0–0.5)
Eosinophils Relative: 3 %
HCT: 35.2 % — ABNORMAL LOW (ref 36.0–46.0)
Hemoglobin: 11.2 g/dL — ABNORMAL LOW (ref 12.0–15.0)
Immature Granulocytes: 0 %
Lymphocytes Relative: 39 %
Lymphs Abs: 1.4 10*3/uL (ref 0.7–4.0)
MCH: 32.1 pg (ref 26.0–34.0)
MCHC: 31.8 g/dL (ref 30.0–36.0)
MCV: 100.9 fL — ABNORMAL HIGH (ref 80.0–100.0)
Monocytes Absolute: 0.4 10*3/uL (ref 0.1–1.0)
Monocytes Relative: 11 %
Neutro Abs: 1.7 10*3/uL (ref 1.7–7.7)
Neutrophils Relative %: 46 %
Platelet Count: 129 10*3/uL — ABNORMAL LOW (ref 150–400)
RBC: 3.49 MIL/uL — ABNORMAL LOW (ref 3.87–5.11)
RDW: 13.3 % (ref 11.5–15.5)
WBC Count: 3.7 10*3/uL — ABNORMAL LOW (ref 4.0–10.5)
nRBC: 0 % (ref 0.0–0.2)

## 2022-01-11 LAB — CMP (CANCER CENTER ONLY)
ALT: 9 U/L (ref 0–44)
AST: 18 U/L (ref 15–41)
Albumin: 4.3 g/dL (ref 3.5–5.0)
Alkaline Phosphatase: 58 U/L (ref 38–126)
Anion gap: 6 (ref 5–15)
BUN: 18 mg/dL (ref 8–23)
CO2: 30 mmol/L (ref 22–32)
Calcium: 10.3 mg/dL (ref 8.9–10.3)
Chloride: 105 mmol/L (ref 98–111)
Creatinine: 1.05 mg/dL — ABNORMAL HIGH (ref 0.44–1.00)
GFR, Estimated: 55 mL/min — ABNORMAL LOW (ref >60)
Glucose, Bld: 125 mg/dL — ABNORMAL HIGH (ref 70–99)
Potassium: 4.8 mmol/L (ref 3.5–5.1)
Sodium: 141 mmol/L (ref 135–145)
Total Bilirubin: 1.1 mg/dL (ref 0.3–1.2)
Total Protein: 7.1 g/dL (ref 6.5–8.1)

## 2022-02-01 ENCOUNTER — Ambulatory Visit
Admission: RE | Admit: 2022-02-01 | Discharge: 2022-02-01 | Disposition: A | Discharge status: Home / Self Care | Payer: Medicare Other | Source: Ambulatory Visit | Attending: Obstetrics and Gynecology | Admitting: Obstetrics and Gynecology

## 2022-02-01 ENCOUNTER — Ambulatory Visit: Payer: Medicare Other

## 2022-02-01 ENCOUNTER — Ambulatory Visit: Payer: Medicare Other | Admitting: Family

## 2022-02-01 ENCOUNTER — Other Ambulatory Visit: Payer: Medicare Other

## 2022-02-01 DIAGNOSIS — Z1231 Encounter for screening mammogram for malignant neoplasm of breast: Secondary | ICD-10-CM

## 2022-02-07 ENCOUNTER — Inpatient Hospital Stay: Payer: Medicare Other

## 2022-02-07 ENCOUNTER — Other Ambulatory Visit: Payer: Self-pay | Admitting: Family

## 2022-02-07 ENCOUNTER — Inpatient Hospital Stay: Payer: Medicare Other | Attending: Family

## 2022-02-07 ENCOUNTER — Inpatient Hospital Stay: Payer: Medicare Other | Admitting: Family

## 2022-02-07 DIAGNOSIS — D509 Iron deficiency anemia, unspecified: Secondary | ICD-10-CM | POA: Insufficient documentation

## 2022-02-07 DIAGNOSIS — D631 Anemia in chronic kidney disease: Secondary | ICD-10-CM | POA: Insufficient documentation

## 2022-02-07 DIAGNOSIS — D500 Iron deficiency anemia secondary to blood loss (chronic): Secondary | ICD-10-CM

## 2022-02-07 DIAGNOSIS — N189 Chronic kidney disease, unspecified: Secondary | ICD-10-CM | POA: Diagnosis present

## 2022-02-07 LAB — CMP (CANCER CENTER ONLY)
ALT: 12 U/L (ref 0–44)
AST: 20 U/L (ref 15–41)
Albumin: 4.4 g/dL (ref 3.5–5.0)
Alkaline Phosphatase: 57 U/L (ref 38–126)
Anion gap: 6 (ref 5–15)
BUN: 20 mg/dL (ref 8–23)
CO2: 29 mmol/L (ref 22–32)
Calcium: 10.6 mg/dL — ABNORMAL HIGH (ref 8.9–10.3)
Chloride: 106 mmol/L (ref 98–111)
Creatinine: 0.96 mg/dL (ref 0.44–1.00)
GFR, Estimated: >60 mL/min (ref >60)
Glucose, Bld: 106 mg/dL — ABNORMAL HIGH (ref 70–99)
Potassium: 4.4 mmol/L (ref 3.5–5.1)
Sodium: 141 mmol/L (ref 135–145)
Total Bilirubin: 0.9 mg/dL (ref 0.3–1.2)
Total Protein: 7.6 g/dL (ref 6.5–8.1)

## 2022-02-07 LAB — CBC WITH DIFFERENTIAL (CANCER CENTER ONLY)
Abs Immature Granulocytes: 0.01 10*3/uL (ref 0.00–0.07)
Basophils Absolute: 0 10*3/uL (ref 0.0–0.1)
Basophils Relative: 1 %
Eosinophils Absolute: 0.2 10*3/uL (ref 0.0–0.5)
Eosinophils Relative: 4 %
HCT: 35.2 % — ABNORMAL LOW (ref 36.0–46.0)
Hemoglobin: 11.5 g/dL — ABNORMAL LOW (ref 12.0–15.0)
Immature Granulocytes: 0 %
Lymphocytes Relative: 34 %
Lymphs Abs: 1.3 10*3/uL (ref 0.7–4.0)
MCH: 32 pg (ref 26.0–34.0)
MCHC: 32.7 g/dL (ref 30.0–36.0)
MCV: 98.1 fL (ref 80.0–100.0)
Monocytes Absolute: 0.3 10*3/uL (ref 0.1–1.0)
Monocytes Relative: 9 %
Neutro Abs: 2 10*3/uL (ref 1.7–7.7)
Neutrophils Relative %: 52 %
Platelet Count: 140 10*3/uL — ABNORMAL LOW (ref 150–400)
RBC: 3.59 MIL/uL — ABNORMAL LOW (ref 3.87–5.11)
RDW: 12.6 % (ref 11.5–15.5)
WBC Count: 3.9 10*3/uL — ABNORMAL LOW (ref 4.0–10.5)
nRBC: 0 % (ref 0.0–0.2)

## 2022-02-07 LAB — IRON AND IRON BINDING CAPACITY (CC-WL,HP ONLY)
Iron: 95 ug/dL (ref 28–170)
Saturation Ratios: 33 % — ABNORMAL HIGH (ref 10.4–31.8)
TIBC: 291 ug/dL (ref 250–450)
UIBC: 196 ug/dL (ref 148–442)

## 2022-02-07 LAB — RETICULOCYTES
Immature Retic Fract: 8.4 % (ref 2.3–15.9)
RBC.: 3.57 MIL/uL — ABNORMAL LOW (ref 3.87–5.11)
Retic Count, Absolute: 56.8 10*3/uL (ref 19.0–186.0)
Retic Ct Pct: 1.6 % (ref 0.4–3.1)

## 2022-02-07 LAB — FERRITIN: Ferritin: 352 ng/mL — ABNORMAL HIGH (ref 11–307)

## 2022-02-28 ENCOUNTER — Inpatient Hospital Stay: Payer: Medicare Other

## 2022-02-28 ENCOUNTER — Encounter: Payer: Self-pay | Admitting: Family

## 2022-02-28 ENCOUNTER — Inpatient Hospital Stay: Payer: Medicare Other | Attending: Family | Admitting: Family

## 2022-02-28 VITALS — BP 141/44 | HR 64 | Temp 98.3°F | Resp 17 | Wt 110.1 lb

## 2022-02-28 DIAGNOSIS — N184 Chronic kidney disease, stage 4 (severe): Secondary | ICD-10-CM | POA: Diagnosis not present

## 2022-02-28 DIAGNOSIS — Z79899 Other long term (current) drug therapy: Secondary | ICD-10-CM | POA: Diagnosis not present

## 2022-02-28 DIAGNOSIS — D631 Anemia in chronic kidney disease: Secondary | ICD-10-CM

## 2022-02-28 DIAGNOSIS — I129 Hypertensive chronic kidney disease with stage 1 through stage 4 chronic kidney disease, or unspecified chronic kidney disease: Secondary | ICD-10-CM | POA: Diagnosis present

## 2022-02-28 DIAGNOSIS — D500 Iron deficiency anemia secondary to blood loss (chronic): Secondary | ICD-10-CM

## 2022-02-28 LAB — CMP (CANCER CENTER ONLY)
ALT: 10 U/L (ref 0–44)
AST: 19 U/L (ref 15–41)
Albumin: 4.5 g/dL (ref 3.5–5.0)
Alkaline Phosphatase: 60 U/L (ref 38–126)
Anion gap: 7 (ref 5–15)
BUN: 22 mg/dL (ref 8–23)
CO2: 28 mmol/L (ref 22–32)
Calcium: 10.2 mg/dL (ref 8.9–10.3)
Chloride: 105 mmol/L (ref 98–111)
Creatinine: 1.16 mg/dL — ABNORMAL HIGH (ref 0.44–1.00)
GFR, Estimated: 49 mL/min — ABNORMAL LOW (ref >60)
Glucose, Bld: 106 mg/dL — ABNORMAL HIGH (ref 70–99)
Potassium: 4.6 mmol/L (ref 3.5–5.1)
Sodium: 140 mmol/L (ref 135–145)
Total Bilirubin: 1 mg/dL (ref 0.3–1.2)
Total Protein: 7.7 g/dL (ref 6.5–8.1)

## 2022-02-28 LAB — CBC WITH DIFFERENTIAL (CANCER CENTER ONLY)
Abs Immature Granulocytes: 0.01 10*3/uL (ref 0.00–0.07)
Basophils Absolute: 0 10*3/uL (ref 0.0–0.1)
Basophils Relative: 1 %
Eosinophils Absolute: 0.2 10*3/uL (ref 0.0–0.5)
Eosinophils Relative: 5 %
HCT: 32.9 % — ABNORMAL LOW (ref 36.0–46.0)
Hemoglobin: 10.7 g/dL — ABNORMAL LOW (ref 12.0–15.0)
Immature Granulocytes: 0 %
Lymphocytes Relative: 43 %
Lymphs Abs: 2 10*3/uL (ref 0.7–4.0)
MCH: 32.1 pg (ref 26.0–34.0)
MCHC: 32.5 g/dL (ref 30.0–36.0)
MCV: 98.8 fL (ref 80.0–100.0)
Monocytes Absolute: 0.5 10*3/uL (ref 0.1–1.0)
Monocytes Relative: 11 %
Neutro Abs: 1.9 10*3/uL (ref 1.7–7.7)
Neutrophils Relative %: 40 %
Platelet Count: 147 10*3/uL — ABNORMAL LOW (ref 150–400)
RBC: 3.33 MIL/uL — ABNORMAL LOW (ref 3.87–5.11)
RDW: 12.6 % (ref 11.5–15.5)
WBC Count: 4.8 10*3/uL (ref 4.0–10.5)
nRBC: 0 % (ref 0.0–0.2)

## 2022-02-28 MED ORDER — EPOETIN ALFA-EPBX 40000 UNIT/ML IJ SOLN
40000.0000 [IU] | Freq: Once | INTRAMUSCULAR | Status: AC
  Administered 2022-02-28: 40000 [IU] via SUBCUTANEOUS
  Filled 2022-02-28: qty 1

## 2022-02-28 NOTE — Patient Instructions (Signed)

## 2022-02-28 NOTE — Progress Notes (Signed)
Hematology and Oncology Follow Up Visit  Rachael Davenport [id number] 10/02/1945 76 y.o. 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 Huertas is here today for follow-up and injection. Hgb today is 10.7.  She states that had minimal blood on her toilet tissue yesterday due to straining with constipation and hemorrhoids.  No other blood loss noted. No bruising or petechiae.  No fever, chills, n/v, cough, rash, dizziness, SOB, chest pain, palpitations, abdominal pain or changes in bowel or bladder habits.  No swelling, tenderness, numbness or tingling in her extremities.  No falls or syncope reported.  Appetite and hydration are good. Weight is stable at 110 lbs.   ECOG Performance Status: 1 - Symptomatic but completely ambulatory  Medications:  Allergies as of 02/28/2022       Reactions   Penicillins Itching, Swelling   Has patient had a PCN reaction causing immediate rash, facial/tongue/throat swelling, SOB or lightheadedness with hypotension: Yes Has patient had a PCN reaction causing severe rash involving mucus membranes or skin necrosis: No Has patient had a PCN reaction that required hospitalization: No Has patient had a PCN reaction occurring within the last 10 years: No If all of the above answers are "NO", then may proceed with Cephalosporin use.   Valsartan Itching, Other (See Comments)   Heart sped up   Amlodipine Itching   Patient can take lower dose Norvasc         Medication List        Accurate as of February 28, 2022  9:06 AM. If you have any questions, ask your nurse or doctor.          amLODipine 2.5 MG tablet Commonly known as: NORVASC Take 2.5 mg by mouth daily.   aspirin EC 81 MG tablet Take 81 mg by mouth daily. Swallow  whole.   carvedilol 3.125 MG tablet Commonly known as: COREG Take 3.125 mg by mouth 2 (two) times daily.   Estradiol 4 MCG Inst Imvexxy Maintenance Pack 4 mcg vaginal insert  Insert 1 vaginal insert twice a week by vaginal route.   furosemide 20 MG tablet Commonly known as: LASIX Take by mouth.   polyethylene glycol 17 g packet Commonly known as: MIRALAX / GLYCOLAX Take 17 g by mouth daily as needed.   pravastatin 10 MG tablet Commonly known as: PRAVACHOL pravastatin 10 mg tablet   Restasis 0.05 % ophthalmic emulsion Generic drug: cycloSPORINE INT 1 GTT IN OU BID UTD   SUMAtriptan 25 MG tablet Commonly known as: IMITREX Take 25 mg by mouth once as needed for migraine (MAY REPEAT ONCE IN 2 HOURS IF NO RELIEF (MAX 2 TABLETS/24 HOURS)).   Vitamin D (Ergocalciferol) 1.25 MG (50000 UNIT) Caps capsule Commonly known as: DRISDOL Take 50,000 Units by mouth every 7 (seven) days.        Allergies:  Allergies  Allergen Reactions   Penicillins Itching and Swelling    Has patient had a PCN reaction causing immediate rash, facial/tongue/throat swelling, SOB or lightheadedness with hypotension: Yes Has patient had a PCN reaction causing severe rash involving mucus membranes or skin necrosis: No Has patient had a PCN reaction that required hospitalization: No Has patient had a PCN reaction occurring within the last 10 years: No If all  of the above answers are "NO", then may proceed with Cephalosporin use.    Valsartan Itching and Other (See Comments)    Heart sped up   Amlodipine Itching    Patient can take lower dose Norvasc     Past Medical History, Surgical history, Social history, and Family History were reviewed and updated.  Review of Systems: All other 10 point review of systems is negative.   Physical Exam:  weight is 110 lb 1.9 oz (50 kg). Her oral temperature is 98.3 F (36.8 C). Her blood pressure is 141/44 (abnormal) and her pulse is 64. Her respiration is 17  and oxygen saturation is 100%.   Wt Readings from Last 3 Encounters:  02/28/22 110 lb 1.9 oz (50 kg)  12/21/21 108 lb 12.8 oz (49.4 kg)  11/09/21 107 lb 12.8 oz (48.9 kg)    Ocular: Sclerae unicteric, pupils equal, round and reactive to light Ear-nose-throat: Oropharynx clear, dentition fair Lymphatic: No cervical or supraclavicular adenopathy Lungs no rales or rhonchi, good excursion bilaterally Heart regular rate and rhythm, no murmur appreciated Abd soft, nontender, positive bowel sounds MSK no focal spinal tenderness, no joint edema Neuro: non-focal, well-oriented, appropriate affect Breasts: Deferred  Lab Results  Component Value Date   WBC 4.8 02/28/2022   HGB 10.7 (L) 02/28/2022   HCT 32.9 (L) 02/28/2022   MCV 98.8 02/28/2022   PLT 147 (L) 02/28/2022   Lab Results  Component Value Date   FERRITIN 352 (H) 02/07/2022   IRON 95 02/07/2022   TIBC 291 02/07/2022   UIBC 196 02/07/2022   IRONPCTSAT 33 (H) 02/07/2022   Lab Results  Component Value Date   RETICCTPCT 1.6 02/07/2022   RBC 3.33 (L) 02/28/2022   No results found for: "KPAFRELGTCHN", "LAMBDASER", "KAPLAMBRATIO" No results found for: "IGGSERUM", "IGA", "IGMSERUM" No results found for: "TOTALPROTELP", "ALBUMINELP", "A1GS", "A2GS", "BETS", "BETA2SER", "GAMS", "MSPIKE", "SPEI"   Chemistry      Component Value Date/Time   NA 141 02/07/2022 0918   NA 145 02/13/2017 1046   NA 142 12/23/2016 0951   K 4.4 02/07/2022 0918   K 4.6 02/13/2017 1046   K 3.9 12/23/2016 0951   CL 106 02/07/2022 0918   CL 108 02/13/2017 1046   CO2 29 02/07/2022 0918   CO2 28 02/13/2017 1046   CO2 25 12/23/2016 0951   BUN 20 02/07/2022 0918   BUN 22 02/13/2017 1046   BUN 15.0 12/23/2016 0951   CREATININE 0.96 02/07/2022 0918   CREATININE 1.1 02/13/2017 1046   CREATININE 0.9 12/23/2016 0951      Component Value Date/Time   CALCIUM 10.6 (H) 02/07/2022 0918   CALCIUM 9.7 02/13/2017 1046   CALCIUM 9.5 12/23/2016 0951   ALKPHOS  57 02/07/2022 0918   ALKPHOS 60 02/13/2017 1046   ALKPHOS 69 12/23/2016 0951   AST 20 02/07/2022 0918   AST 26 12/23/2016 0951   ALT 12 02/07/2022 0918   ALT 18 02/13/2017 1046   ALT 16 12/23/2016 0951   BILITOT 0.9 02/07/2022 0918   BILITOT 0.96 12/23/2016 0951       Impression and Plan: Ms. Mandarino is a very pleasant 76 yo African American female with anemia of chronic renal insufficiency and erythropoietin deficiency. ESA given today for Hgb 10.7.  Iron studies are pending.  Lab and injection every 3 weeks.  Follow-up in 9 weeks.  Lottie Dawson, NP 12/11/20239:06 AM

## 2022-03-22 ENCOUNTER — Inpatient Hospital Stay: Payer: Medicare Other | Attending: Family

## 2022-03-22 ENCOUNTER — Inpatient Hospital Stay: Payer: Medicare Other

## 2022-03-22 ENCOUNTER — Inpatient Hospital Stay (HOSPITAL_BASED_OUTPATIENT_CLINIC_OR_DEPARTMENT_OTHER): Payer: Medicare Other | Admitting: Family

## 2022-03-22 DIAGNOSIS — N189 Chronic kidney disease, unspecified: Secondary | ICD-10-CM | POA: Diagnosis not present

## 2022-03-22 DIAGNOSIS — I129 Hypertensive chronic kidney disease with stage 1 through stage 4 chronic kidney disease, or unspecified chronic kidney disease: Secondary | ICD-10-CM | POA: Insufficient documentation

## 2022-03-22 DIAGNOSIS — D500 Iron deficiency anemia secondary to blood loss (chronic): Secondary | ICD-10-CM

## 2022-03-22 DIAGNOSIS — D631 Anemia in chronic kidney disease: Secondary | ICD-10-CM | POA: Diagnosis not present

## 2022-03-22 DIAGNOSIS — Z79899 Other long term (current) drug therapy: Secondary | ICD-10-CM | POA: Diagnosis not present

## 2022-03-22 LAB — CBC WITH DIFFERENTIAL (CANCER CENTER ONLY)
Abs Immature Granulocytes: 0.01 10*3/uL (ref 0.00–0.07)
Basophils Absolute: 0 10*3/uL (ref 0.0–0.1)
Basophils Relative: 1 %
Eosinophils Absolute: 0.1 10*3/uL (ref 0.0–0.5)
Eosinophils Relative: 4 %
HCT: 34.8 % — ABNORMAL LOW (ref 36.0–46.0)
Hemoglobin: 11 g/dL — ABNORMAL LOW (ref 12.0–15.0)
Immature Granulocytes: 0 %
Lymphocytes Relative: 37 %
Lymphs Abs: 1.2 10*3/uL (ref 0.7–4.0)
MCH: 32.4 pg (ref 26.0–34.0)
MCHC: 31.6 g/dL (ref 30.0–36.0)
MCV: 102.4 fL — ABNORMAL HIGH (ref 80.0–100.0)
Monocytes Absolute: 0.3 10*3/uL (ref 0.1–1.0)
Monocytes Relative: 10 %
Neutro Abs: 1.5 10*3/uL — ABNORMAL LOW (ref 1.7–7.7)
Neutrophils Relative %: 48 %
Platelet Count: 122 10*3/uL — ABNORMAL LOW (ref 150–400)
RBC: 3.4 MIL/uL — ABNORMAL LOW (ref 3.87–5.11)
RDW: 12.9 % (ref 11.5–15.5)
WBC Count: 3.2 10*3/uL — ABNORMAL LOW (ref 4.0–10.5)
nRBC: 0 % (ref 0.0–0.2)

## 2022-03-22 LAB — CMP (CANCER CENTER ONLY)
ALT: 11 U/L (ref 0–44)
AST: 20 U/L (ref 15–41)
Albumin: 4.5 g/dL (ref 3.5–5.0)
Alkaline Phosphatase: 57 U/L (ref 38–126)
Anion gap: 7 (ref 5–15)
BUN: 22 mg/dL (ref 8–23)
CO2: 30 mmol/L (ref 22–32)
Calcium: 10 mg/dL (ref 8.9–10.3)
Chloride: 102 mmol/L (ref 98–111)
Creatinine: 1.11 mg/dL — ABNORMAL HIGH (ref 0.44–1.00)
GFR, Estimated: 51 mL/min — ABNORMAL LOW (ref >60)
Glucose, Bld: 118 mg/dL — ABNORMAL HIGH (ref 70–99)
Potassium: 4.2 mmol/L (ref 3.5–5.1)
Sodium: 139 mmol/L (ref 135–145)
Total Bilirubin: 1.2 mg/dL (ref 0.3–1.2)
Total Protein: 7.8 g/dL (ref 6.5–8.1)

## 2022-03-22 LAB — RETICULOCYTES
Immature Retic Fract: 5.2 % (ref 2.3–15.9)
RBC.: 3.45 MIL/uL — ABNORMAL LOW (ref 3.87–5.11)
Retic Count, Absolute: 41.4 10*3/uL (ref 19.0–186.0)
Retic Ct Pct: 1.2 % (ref 0.4–3.1)

## 2022-03-22 LAB — FERRITIN: Ferritin: 286 ng/mL (ref 11–307)

## 2022-03-22 LAB — IRON AND IRON BINDING CAPACITY (CC-WL,HP ONLY)
Iron: 101 ug/dL (ref 28–170)
Saturation Ratios: 34 % — ABNORMAL HIGH (ref 10.4–31.8)
TIBC: 297 ug/dL (ref 250–450)
UIBC: 196 ug/dL (ref 148–442)

## 2022-03-22 NOTE — Progress Notes (Signed)
Error

## 2022-05-02 ENCOUNTER — Ambulatory Visit: Payer: Medicare Other | Admitting: Family

## 2022-05-02 ENCOUNTER — Inpatient Hospital Stay: Payer: Medicare Other

## 2022-05-02 ENCOUNTER — Ambulatory Visit: Payer: Medicare Other

## 2022-05-03 ENCOUNTER — Other Ambulatory Visit: Payer: Self-pay | Admitting: Family

## 2022-05-03 ENCOUNTER — Inpatient Hospital Stay: Payer: Medicare Other

## 2022-05-03 ENCOUNTER — Inpatient Hospital Stay: Payer: Medicare Other | Attending: Family

## 2022-05-03 ENCOUNTER — Inpatient Hospital Stay: Payer: Medicare Other | Admitting: Family

## 2022-05-03 ENCOUNTER — Encounter: Payer: Self-pay | Admitting: Family

## 2022-05-03 VITALS — BP 130/55 | HR 56 | Temp 98.8°F | Resp 17 | Wt 109.1 lb

## 2022-05-03 DIAGNOSIS — N189 Chronic kidney disease, unspecified: Secondary | ICD-10-CM | POA: Diagnosis not present

## 2022-05-03 DIAGNOSIS — D631 Anemia in chronic kidney disease: Secondary | ICD-10-CM

## 2022-05-03 DIAGNOSIS — R350 Frequency of micturition: Secondary | ICD-10-CM | POA: Diagnosis not present

## 2022-05-03 DIAGNOSIS — D500 Iron deficiency anemia secondary to blood loss (chronic): Secondary | ICD-10-CM

## 2022-05-03 DIAGNOSIS — K5909 Other constipation: Secondary | ICD-10-CM | POA: Diagnosis not present

## 2022-05-03 DIAGNOSIS — D509 Iron deficiency anemia, unspecified: Secondary | ICD-10-CM | POA: Insufficient documentation

## 2022-05-03 LAB — IRON AND IRON BINDING CAPACITY (CC-WL,HP ONLY)
Iron: 83 ug/dL (ref 28–170)
Saturation Ratios: 28 % (ref 10.4–31.8)
TIBC: 297 ug/dL (ref 250–450)
UIBC: 214 ug/dL (ref 148–442)

## 2022-05-03 LAB — CBC WITH DIFFERENTIAL (CANCER CENTER ONLY)
Abs Immature Granulocytes: 0.04 10*3/uL (ref 0.00–0.07)
Basophils Absolute: 0 10*3/uL (ref 0.0–0.1)
Basophils Relative: 1 %
Eosinophils Absolute: 0.2 10*3/uL (ref 0.0–0.5)
Eosinophils Relative: 4 %
HCT: 34.1 % — ABNORMAL LOW (ref 36.0–46.0)
Hemoglobin: 11 g/dL — ABNORMAL LOW (ref 12.0–15.0)
Immature Granulocytes: 1 %
Lymphocytes Relative: 30 %
Lymphs Abs: 1.3 10*3/uL (ref 0.7–4.0)
MCH: 32 pg (ref 26.0–34.0)
MCHC: 32.3 g/dL (ref 30.0–36.0)
MCV: 99.1 fL (ref 80.0–100.0)
Monocytes Absolute: 0.4 10*3/uL (ref 0.1–1.0)
Monocytes Relative: 10 %
Neutro Abs: 2.3 10*3/uL (ref 1.7–7.7)
Neutrophils Relative %: 54 %
Platelet Count: 133 10*3/uL — ABNORMAL LOW (ref 150–400)
RBC: 3.44 MIL/uL — ABNORMAL LOW (ref 3.87–5.11)
RDW: 12.4 % (ref 11.5–15.5)
WBC Count: 4.3 10*3/uL (ref 4.0–10.5)
nRBC: 0 % (ref 0.0–0.2)

## 2022-05-03 LAB — CMP (CANCER CENTER ONLY)
ALT: 12 U/L (ref 0–44)
AST: 22 U/L (ref 15–41)
Albumin: 4.6 g/dL (ref 3.5–5.0)
Alkaline Phosphatase: 54 U/L (ref 38–126)
Anion gap: 8 (ref 5–15)
BUN: 18 mg/dL (ref 8–23)
CO2: 29 mmol/L (ref 22–32)
Calcium: 10.3 mg/dL (ref 8.9–10.3)
Chloride: 103 mmol/L (ref 98–111)
Creatinine: 1.02 mg/dL — ABNORMAL HIGH (ref 0.44–1.00)
GFR, Estimated: 57 mL/min — ABNORMAL LOW (ref >60)
Glucose, Bld: 104 mg/dL — ABNORMAL HIGH (ref 70–99)
Potassium: 5.1 mmol/L (ref 3.5–5.1)
Sodium: 140 mmol/L (ref 135–145)
Total Bilirubin: 1 mg/dL (ref 0.3–1.2)
Total Protein: 8 g/dL (ref 6.5–8.1)

## 2022-05-03 LAB — RETICULOCYTES
Immature Retic Fract: 7.5 % (ref 2.3–15.9)
RBC.: 3.47 MIL/uL — ABNORMAL LOW (ref 3.87–5.11)
Retic Count, Absolute: 61.4 10*3/uL (ref 19.0–186.0)
Retic Ct Pct: 1.8 % (ref 0.4–3.1)

## 2022-05-03 LAB — FERRITIN: Ferritin: 307 ng/mL (ref 11–307)

## 2022-05-03 NOTE — Progress Notes (Signed)
Hematology and Oncology Follow Up Visit  Rachael Davenport [id number] [birth year] 77 y.o. 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 She is doing well and denies fatigue at this time.  She has been having issues with chronic constipation and is on Linzess. She states that GI would like her to be on this for a few months and then re-evaluate.  No blood loss noted. No bruising or petechiae.  No fever, chills, n/v, cough, rash, dizziness, SOB, chest pain, palpitations, abdominal pain or changes in bladder habits.  No swelling, tenderness, numbness or tingling in her extremities.  No falls or syncope.  Appetite and hydration are good. Weight is stable at 109 lbs.   ECOG Performance Status: 1 - Symptomatic but completely ambulatory  Medications:  Allergies as of 05/03/2022       Reactions   Penicillins Itching, Swelling   Has patient had a PCN reaction causing immediate rash, facial/tongue/throat swelling, SOB or lightheadedness with hypotension: Yes Has patient had a PCN reaction causing severe rash involving mucus membranes or skin necrosis: No Has patient had a PCN reaction that required hospitalization: No Has patient had a PCN reaction occurring within the last 10 years: No If all of the above answers are "NO", then may proceed with Cephalosporin use.   Valsartan Itching, Other (See Comments)   Heart sped up   Amlodipine Itching   Patient can take lower dose Norvasc         Medication List        Accurate as of May 03, 2022 10:59 AM. If you have any questions, ask your nurse or doctor.          amLODipine 2.5 MG tablet Commonly known as: NORVASC Take 2.5 mg by mouth daily.   aspirin EC 81 MG tablet Take  81 mg by mouth daily. Swallow whole.   carvedilol 3.125 MG tablet Commonly known as: COREG Take 3.125 mg by mouth 2 (two) times daily.   cyanocobalamin 1000 MCG tablet Commonly known as: VITAMIN B12 Take 1,000 mcg by mouth daily.   Estradiol 4 MCG Inst Imvexxy Maintenance Pack 4 mcg vaginal insert  Insert 1 vaginal insert twice a week by vaginal route.   furosemide 20 MG tablet Commonly known as: LASIX Take by mouth.   polyethylene glycol 17 g packet Commonly known as: MIRALAX / GLYCOLAX Take 17 g by mouth daily as needed.   pravastatin 10 MG tablet Commonly known as: PRAVACHOL pravastatin 10 mg tablet   Restasis 0.05 % ophthalmic emulsion Generic drug: cycloSPORINE INT 1 GTT IN OU BID UTD   SUMAtriptan 25 MG tablet Commonly known as: IMITREX Take 25 mg by mouth once as needed for migraine (MAY REPEAT ONCE IN 2 HOURS IF NO RELIEF (MAX 2 TABLETS/24 HOURS)).   Vitamin D (Ergocalciferol) 1.25 MG (50000 UNIT) Caps capsule Commonly known as: DRISDOL Take 50,000 Units by mouth every 7 (seven) days.        Allergies:  Allergies  Allergen Reactions   Penicillins Itching and Swelling    Has patient had a PCN reaction causing immediate rash, facial/tongue/throat swelling, SOB or lightheadedness with hypotension: Yes Has patient had a PCN reaction causing severe rash involving mucus membranes or  skin necrosis: No Has patient had a PCN reaction that required hospitalization: No Has patient had a PCN reaction occurring within the last 10 years: No If all of the above answers are "NO", then may proceed with Cephalosporin use.    Valsartan Itching and Other (See Comments)    Heart sped up   Amlodipine Itching    Patient can take lower dose Norvasc     Past Medical History, Surgical history, Social history, and Family History were reviewed and updated.  Review of Systems: All other 10 point review of systems is negative.   Physical Exam:  weight is 109 lb 1.9 oz (49.5  kg). Her oral temperature is 98.8 F (37.1 C). Her blood pressure is 130/55 (abnormal) and her pulse is 56 (abnormal). Her respiration is 17 and oxygen saturation is 100%.   Wt Readings from Last 3 Encounters:  05/03/22 109 lb 1.9 oz (49.5 kg)  02/28/22 110 lb 1.9 oz (50 kg)  12/21/21 108 lb 12.8 oz (49.4 kg)    Ocular: Sclerae unicteric, pupils equal, round and reactive to light Ear-nose-throat: Oropharynx clear, dentition fair Lymphatic: No cervical or supraclavicular adenopathy Lungs no rales or rhonchi, good excursion bilaterally Heart regular rate and rhythm, no murmur appreciated Abd soft, nontender, positive bowel sounds MSK no focal spinal tenderness, no joint edema Neuro: non-focal, well-oriented, appropriate affect Breasts: Deferred  Lab Results  Component Value Date   WBC 4.3 05/03/2022   HGB 11.0 (L) 05/03/2022   HCT 34.1 (L) 05/03/2022   MCV 99.1 05/03/2022   PLT 133 (L) 05/03/2022   Lab Results  Component Value Date   FERRITIN 286 03/22/2022   IRON 101 03/22/2022   TIBC 297 03/22/2022   UIBC 196 03/22/2022   IRONPCTSAT 34 (H) 03/22/2022   Lab Results  Component Value Date   RETICCTPCT 1.8 05/03/2022   RBC 3.47 (L) 05/03/2022   No results found for: "KPAFRELGTCHN", "LAMBDASER", "KAPLAMBRATIO" No results found for: "IGGSERUM", "IGA", "IGMSERUM" No results found for: "TOTALPROTELP", "ALBUMINELP", "A1GS", "A2GS", "BETS", "BETA2SER", "GAMS", "MSPIKE", "SPEI"   Chemistry      Component Value Date/Time   NA 139 03/22/2022 0953   NA 145 02/13/2017 1046   NA 142 12/23/2016 0951   K 4.2 03/22/2022 0953   K 4.6 02/13/2017 1046   K 3.9 12/23/2016 0951   CL 102 03/22/2022 0953   CL 108 02/13/2017 1046   CO2 30 03/22/2022 0953   CO2 28 02/13/2017 1046   CO2 25 12/23/2016 0951   [place] 22 03/22/2022 0953   [place] 22 02/13/2017 1046   [place] 15.0 12/23/2016 0951   CREATININE 1.11 (H) 03/22/2022 0953   CREATININE 1.1 02/13/2017 1046   CREATININE 0.9 12/23/2016 0951       Component Value Date/Time   CALCIUM 10.0 03/22/2022 0953   CALCIUM 9.7 02/13/2017 1046   CALCIUM 9.5 12/23/2016 0951   ALKPHOS 57 03/22/2022 0953   ALKPHOS 60 02/13/2017 1046   ALKPHOS 69 12/23/2016 0951   AST 20 03/22/2022 0953   AST 26 12/23/2016 0951   ALT 11 03/22/2022 0953   ALT 18 02/13/2017 1046   ALT 16 12/23/2016 0951   BILITOT 1.2 03/22/2022 0953   BILITOT 0.96 12/23/2016 0951       Impression and Plan: Ms. Waxler is a very pleasant 77 yo African American female with anemia of chronic renal insufficiency and erythropoietin deficiency. Hgb 11.0, no ESA needed this visit.  Iron studies are pending.  Lab and injection every 3  weeks.  Follow-up in 9 weeks.  Lottie Dawson, NP 2/13/202410:59 AM

## 2022-05-04 ENCOUNTER — Ambulatory Visit: Payer: Medicare Other | Admitting: Obstetrics and Gynecology

## 2022-05-04 ENCOUNTER — Encounter: Payer: Self-pay | Admitting: Obstetrics and Gynecology

## 2022-05-04 VITALS — BP 146/58 | HR 59 | Ht 61.5 in | Wt 109.2 lb

## 2022-05-04 DIAGNOSIS — N816 Rectocele: Secondary | ICD-10-CM

## 2022-05-04 DIAGNOSIS — N812 Incomplete uterovaginal prolapse: Secondary | ICD-10-CM

## 2022-05-04 DIAGNOSIS — R350 Frequency of micturition: Secondary | ICD-10-CM | POA: Diagnosis not present

## 2022-05-04 DIAGNOSIS — R82998 Other abnormal findings in urine: Secondary | ICD-10-CM | POA: Diagnosis not present

## 2022-05-04 DIAGNOSIS — D509 Iron deficiency anemia, unspecified: Secondary | ICD-10-CM | POA: Diagnosis not present

## 2022-05-04 DIAGNOSIS — N8110 Cystocele, unspecified: Secondary | ICD-10-CM

## 2022-05-04 LAB — POCT URINALYSIS DIPSTICK
Bilirubin, UA: NEGATIVE
Glucose, UA: NEGATIVE
Ketones, UA: NEGATIVE
Nitrite, UA: NEGATIVE
Protein, UA: NEGATIVE
Spec Grav, UA: 1.015 (ref 1.010–1.025)
Urobilinogen, UA: 1 E.U./dL
pH, UA: 6.5 (ref 5.0–8.0)

## 2022-05-04 NOTE — Progress Notes (Signed)
[facility] New Patient Evaluation and Consultation  Referring Provider: No ref. provider found PCP: Deneise Lever, DO Date of Service: 05/04/2022  SUBJECTIVE Chief Complaint: New Patient (Initial Visit) [facility] Rachael Davenport is a 77 y.o. female here for a consult for prolapse./)  History of Present Illness: Rachael Davenport is a 77 y.o. Black or African-American female presenting for evaluation of prolapse.     Urinary Symptoms: Does not leak urine.   Day time voids- every few hours.  Nocturia: 0 times per night to void. Voiding dysfunction: she empties her bladder well.  does not use a catheter to empty bladder.  When urinating, she feels she has no difficulties  UTIs: 2 UTI's in the last year.   Denies history of blood in urine and kidney or bladder stones  Pelvic Organ Prolapse Symptoms:                  She Admits to a feeling of a bulge the vaginal area. It has been present for a few years.  She Admits to seeing a bulge.  This bulge is bothersome. She currently has a pessary in place. The pessary does help some but the bulge comes around the pessary. She reports that the pessary has caused some abrasions. She is using vaginal estrogen cream twice a week.   Bowel Symptom: Bowel movements: a few times a week.  Stool consistency: hard Straining: yes.  Splinting: no.  Incomplete evacuation: no.  She Denies accidental bowel leakage / fecal incontinence Bowel regimen: fiber (diet).  Uses prune juice if needed. Has tried miralax but did not help.  Linzess caused too much loose stools.   Sexual Function Sexually active: no.    Pelvic Pain Denies pelvic pain  Past Medical History:  Past Medical History:  Diagnosis Date   Hypercholesteremia    Hypertension    Iron deficiency anemia due to chronic blood loss 07/09/2018     Past Surgical History:   Past Surgical History:  Procedure Laterality Date   BUNIONECTOMY     TUBAL LIGATION        Past OB/GYN History: OB History  Gravida Para Term Preterm AB Living  3 3 3     3  $ SAB IAB Ectopic Multiple Live Births          3    # Outcome Date GA Lbr Len/2nd Weight Sex Delivery Anes PTL Lv  3 Term      Vag-Spont     2 Term      Vag-Spont     1 Term      Vag-Spont       Menopausal: Denies vaginal bleeding since menopause Any history of abnormal pap smears: no.   Medications: She has a current medication list which includes the following prescription(s): amlodipine, aspirin ec, carvedilol, cyanocobalamin, estradiol, furosemide, polyethylene glycol, pravastatin, restasis, sumatriptan, and vitamin d (ergocalciferol).   Allergies: Patient is allergic to penicillins, valsartan, and amlodipine.   Social History:  Social History   Tobacco Use   Smoking status: Never   Smokeless tobacco: Never  Vaping Use   Vaping Use: Never used  Substance Use Topics   Alcohol use: Never   Drug use: Never    Relationship status: married She lives with spouse.   She is not employed. Regular exercise: Yes:   History of abuse: No  Family History:   Family History  Problem Relation Age of Onset   Hypertension Mother    Arthritis  Mother    Diabetes Father    Heart disease Brother    Heart attack Brother      Review of Systems: Review of Systems  Constitutional:  Negative for fever, malaise/fatigue and weight loss.  Respiratory:  Negative for cough, shortness of breath and wheezing.   Cardiovascular:  Negative for chest pain, palpitations and leg swelling.  Gastrointestinal:  Negative for abdominal pain and blood in stool.  Genitourinary:  Negative for dysuria.       + vaginal discharge  Musculoskeletal:  Negative for myalgias.  Skin:  Negative for rash.  Neurological:  Negative for dizziness and headaches.  Endo/Heme/Allergies:  Does not bruise/bleed easily.  Psychiatric/Behavioral:  Negative for depression. The patient is not nervous/anxious.       OBJECTIVE Physical Exam: Vitals:   05/04/22 0850 05/04/22 0910  BP: (!) 142/61 (!) 146/58  Pulse: 61 (!) 59  Weight: 109 lb 3.2 oz (49.5 kg)   Height: 5' 1.5" (1.562 m)     Physical Exam Constitutional:      General: She is not in acute distress. Pulmonary:     Effort: Pulmonary effort is normal.  Abdominal:     General: There is no distension.     Palpations: Abdomen is soft.     Tenderness: There is no abdominal tenderness. There is no rebound.  Musculoskeletal:        General: No swelling. Normal range of motion.  Skin:    General: Skin is warm and dry.     Findings: No rash.  Neurological:     Mental Status: She is alert and oriented to person, place, and time.  Psychiatric:        Mood and Affect: Mood normal.        Behavior: Behavior normal.      GU / Detailed Urogynecologic Evaluation:  Pelvic Exam: Normal external female genitalia; Bartholin's and Skene's glands normal in appearance; urethral meatus normal in appearance, no urethral masses or discharge.   CST: negative  Pessary removed and cleaned. Replaced at the end of the exam.  Speculum exam reveals normal vaginal mucosa with atrophy. Cervix normal appearance. Uterus normal single, nontender. Adnexa no mass, fullness, tenderness.     Pelvic floor strength I/V  Pelvic floor musculature: Right levator non-tender, Right obturator non-tender, Left levator non-tender, Left obturator non-tender  POP-Q:   POP-Q  -1                                            Aa   -1                                           Ba  -5                                              C   2.5                                            Gh  4.5  Pb  7.5                                            tvl   -1.5                                            Ap  -1.5                                            Bp  -6                                              D      Rectal  Exam:  Normal external rectum  Post-Void Residual (PVR) by Bladder Scan: In order to evaluate bladder emptying, we discussed obtaining a postvoid residual and she agreed to this procedure.  Procedure: The ultrasound unit was placed on the patient's abdomen in the suprapubic region after the patient had voided. A PVR of 37 ml was obtained by bladder scan.  Laboratory Results: POC urine: small leukocytes   ASSESSMENT AND PLAN Ms. Filipi is a 77 y.o. with:  1. Prolapse of anterior vaginal wall   2. Prolapse of posterior vaginal wall   3. Uterovaginal prolapse, incomplete   4. Leukocytes in urine   5. Urinary frequency    Stage II anterior, Stage II posterior, Stage I apical prolapse - suspect that prolapse is more advanced without the pessary in place since she states it comes past the opening.  - For treatment of pelvic organ prolapse, we discussed options for management including expectant management, conservative management, and surgical management, such as Kegels, a pessary, pelvic floor physical therapy, and specific surgical procedures. - She may be interested in surgery. We reviewed the option of  [place] Colpocleisis since she does not plan to be sexually active. May also need posterior repair with this- would recommend removal of pessary for a few weeks prior to surgery.  - Does not leak urine with or without pessary in place so no need for urodynamics.  - Handout provided and she will let us know if she wants to proceed with surgery, otherwise will continue pessary cleanings with her primary GYN.   2. Leukocytes in urine - will send for culture  3. Constipation - For constipation, we reviewed the importance of a better bowel regimen.  We also discussed the importance of avoiding chronic straining, as it can exacerbate her pelvic floor symptoms.  - Discussed adding fiber supplement daily   She will inform us if she wants to proceed with surery.   Jaquita Folds,  MD

## 2022-05-04 NOTE — Patient Instructions (Signed)

## 2022-05-05 LAB — URINE CULTURE: Culture: <10000 — AB

## 2022-05-09 ENCOUNTER — Encounter: Payer: Self-pay | Admitting: *Deleted

## 2022-05-24 ENCOUNTER — Inpatient Hospital Stay: Payer: Medicare Other | Attending: Family

## 2022-05-24 DIAGNOSIS — N189 Chronic kidney disease, unspecified: Secondary | ICD-10-CM | POA: Insufficient documentation

## 2022-05-24 DIAGNOSIS — D509 Iron deficiency anemia, unspecified: Secondary | ICD-10-CM | POA: Diagnosis present

## 2022-05-24 DIAGNOSIS — D500 Iron deficiency anemia secondary to blood loss (chronic): Secondary | ICD-10-CM

## 2022-05-24 DIAGNOSIS — D631 Anemia in chronic kidney disease: Secondary | ICD-10-CM

## 2022-05-24 LAB — CBC WITH DIFFERENTIAL (CANCER CENTER ONLY)
Abs Immature Granulocytes: 0.03 10*3/uL (ref 0.00–0.07)
Basophils Absolute: 0 10*3/uL (ref 0.0–0.1)
Basophils Relative: 1 %
Eosinophils Absolute: 0.2 10*3/uL (ref 0.0–0.5)
Eosinophils Relative: 4 %
HCT: 32.7 % — ABNORMAL LOW (ref 36.0–46.0)
Hemoglobin: 10.5 g/dL — ABNORMAL LOW (ref 12.0–15.0)
Immature Granulocytes: 1 %
Lymphocytes Relative: 36 %
Lymphs Abs: 1.5 10*3/uL (ref 0.7–4.0)
MCH: 32.2 pg (ref 26.0–34.0)
MCHC: 32.1 g/dL (ref 30.0–36.0)
MCV: 100.3 fL — ABNORMAL HIGH (ref 80.0–100.0)
Monocytes Absolute: 0.4 10*3/uL (ref 0.1–1.0)
Monocytes Relative: 10 %
Neutro Abs: 2.1 10*3/uL (ref 1.7–7.7)
Neutrophils Relative %: 48 %
Platelet Count: 130 10*3/uL — ABNORMAL LOW (ref 150–400)
RBC: 3.26 MIL/uL — ABNORMAL LOW (ref 3.87–5.11)
RDW: 12.5 % (ref 11.5–15.5)
WBC Count: 4.3 10*3/uL (ref 4.0–10.5)
nRBC: 0 % (ref 0.0–0.2)

## 2022-05-24 LAB — CMP (CANCER CENTER ONLY)
ALT: 13 U/L (ref 0–44)
AST: 21 U/L (ref 15–41)
Albumin: 4.4 g/dL (ref 3.5–5.0)
Alkaline Phosphatase: 52 U/L (ref 38–126)
Anion gap: 6 (ref 5–15)
BUN: 17 mg/dL (ref 8–23)
CO2: 28 mmol/L (ref 22–32)
Calcium: 10.3 mg/dL (ref 8.9–10.3)
Chloride: 107 mmol/L (ref 98–111)
Creatinine: 1.05 mg/dL — ABNORMAL HIGH (ref 0.44–1.00)
GFR, Estimated: 55 mL/min — ABNORMAL LOW (ref >60)
Glucose, Bld: 113 mg/dL — ABNORMAL HIGH (ref 70–99)
Potassium: 4.8 mmol/L (ref 3.5–5.1)
Sodium: 141 mmol/L (ref 135–145)
Total Bilirubin: 1.1 mg/dL (ref 0.3–1.2)
Total Protein: 7.4 g/dL (ref 6.5–8.1)

## 2022-06-14 ENCOUNTER — Other Ambulatory Visit: Payer: Medicare Other

## 2022-06-16 ENCOUNTER — Inpatient Hospital Stay: Payer: Medicare Other

## 2022-06-16 DIAGNOSIS — D509 Iron deficiency anemia, unspecified: Secondary | ICD-10-CM | POA: Diagnosis not present

## 2022-06-16 DIAGNOSIS — D631 Anemia in chronic kidney disease: Secondary | ICD-10-CM

## 2022-06-16 DIAGNOSIS — D500 Iron deficiency anemia secondary to blood loss (chronic): Secondary | ICD-10-CM

## 2022-06-16 LAB — CBC WITH DIFFERENTIAL (CANCER CENTER ONLY)
Abs Immature Granulocytes: 0.01 10*3/uL (ref 0.00–0.07)
Basophils Absolute: 0 10*3/uL (ref 0.0–0.1)
Basophils Relative: 1 %
Eosinophils Absolute: 0.1 10*3/uL (ref 0.0–0.5)
Eosinophils Relative: 3 %
HCT: 30.4 % — ABNORMAL LOW (ref 36.0–46.0)
Hemoglobin: 9.8 g/dL — ABNORMAL LOW (ref 12.0–15.0)
Immature Granulocytes: 0 %
Lymphocytes Relative: 33 %
Lymphs Abs: 1.4 10*3/uL (ref 0.7–4.0)
MCH: 31.9 pg (ref 26.0–34.0)
MCHC: 32.2 g/dL (ref 30.0–36.0)
MCV: 99 fL (ref 80.0–100.0)
Monocytes Absolute: 0.5 10*3/uL (ref 0.1–1.0)
Monocytes Relative: 12 %
Neutro Abs: 2.1 10*3/uL (ref 1.7–7.7)
Neutrophils Relative %: 51 %
Platelet Count: 147 10*3/uL — ABNORMAL LOW (ref 150–400)
RBC: 3.07 MIL/uL — ABNORMAL LOW (ref 3.87–5.11)
RDW: 12.6 % (ref 11.5–15.5)
WBC Count: 4.2 10*3/uL (ref 4.0–10.5)
nRBC: 0 % (ref 0.0–0.2)

## 2022-06-16 LAB — CMP (CANCER CENTER ONLY)
ALT: 9 U/L (ref 0–44)
AST: 17 U/L (ref 15–41)
Albumin: 4.2 g/dL (ref 3.5–5.0)
Alkaline Phosphatase: 47 U/L (ref 38–126)
Anion gap: 6 (ref 5–15)
BUN: 20 mg/dL (ref 8–23)
CO2: 28 mmol/L (ref 22–32)
Calcium: 9.7 mg/dL (ref 8.9–10.3)
Chloride: 108 mmol/L (ref 98–111)
Creatinine: 1.03 mg/dL — ABNORMAL HIGH (ref 0.44–1.00)
GFR, Estimated: 56 mL/min — ABNORMAL LOW (ref >60)
Glucose, Bld: 94 mg/dL (ref 70–99)
Potassium: 4.7 mmol/L (ref 3.5–5.1)
Sodium: 142 mmol/L (ref 135–145)
Total Bilirubin: 0.9 mg/dL (ref 0.3–1.2)
Total Protein: 6.9 g/dL (ref 6.5–8.1)

## 2022-07-05 ENCOUNTER — Encounter: Payer: Self-pay | Admitting: Family

## 2022-07-05 ENCOUNTER — Inpatient Hospital Stay: Payer: Medicare Other

## 2022-07-05 ENCOUNTER — Inpatient Hospital Stay: Payer: Medicare Other | Admitting: Family

## 2022-07-05 ENCOUNTER — Inpatient Hospital Stay: Payer: Medicare Other | Attending: Family

## 2022-07-05 VITALS — BP 149/68 | HR 57 | Temp 98.4°F | Resp 17 | Wt 106.8 lb

## 2022-07-05 DIAGNOSIS — D500 Iron deficiency anemia secondary to blood loss (chronic): Secondary | ICD-10-CM

## 2022-07-05 DIAGNOSIS — D509 Iron deficiency anemia, unspecified: Secondary | ICD-10-CM | POA: Diagnosis not present

## 2022-07-05 DIAGNOSIS — D631 Anemia in chronic kidney disease: Secondary | ICD-10-CM | POA: Diagnosis present

## 2022-07-05 DIAGNOSIS — N189 Chronic kidney disease, unspecified: Secondary | ICD-10-CM | POA: Diagnosis present

## 2022-07-05 LAB — CMP (CANCER CENTER ONLY)
ALT: 11 U/L (ref 0–44)
AST: 15 U/L (ref 15–41)
Albumin: 4.1 g/dL (ref 3.5–5.0)
Alkaline Phosphatase: 45 U/L (ref 38–126)
Anion gap: 7 (ref 5–15)
BUN: 17 mg/dL (ref 8–23)
CO2: 29 mmol/L (ref 22–32)
Calcium: 9.5 mg/dL (ref 8.9–10.3)
Chloride: 107 mmol/L (ref 98–111)
Creatinine: 1.07 mg/dL — ABNORMAL HIGH (ref 0.44–1.00)
GFR, Estimated: 53 mL/min — ABNORMAL LOW (ref >60)
Glucose, Bld: 92 mg/dL (ref 70–99)
Potassium: 4.5 mmol/L (ref 3.5–5.1)
Sodium: 143 mmol/L (ref 135–145)
Total Bilirubin: 1 mg/dL (ref 0.3–1.2)
Total Protein: 7.2 g/dL (ref 6.5–8.1)

## 2022-07-05 LAB — CBC WITH DIFFERENTIAL (CANCER CENTER ONLY)
Abs Immature Granulocytes: 0.09 10*3/uL — ABNORMAL HIGH (ref 0.00–0.07)
Basophils Absolute: 0 10*3/uL (ref 0.0–0.1)
Basophils Relative: 1 %
Eosinophils Absolute: 0.2 10*3/uL (ref 0.0–0.5)
Eosinophils Relative: 3 %
HCT: 32.5 % — ABNORMAL LOW (ref 36.0–46.0)
Hemoglobin: 10.5 g/dL — ABNORMAL LOW (ref 12.0–15.0)
Immature Granulocytes: 1 %
Lymphocytes Relative: 27 %
Lymphs Abs: 1.9 10*3/uL (ref 0.7–4.0)
MCH: 32.4 pg (ref 26.0–34.0)
MCHC: 32.3 g/dL (ref 30.0–36.0)
MCV: 100.3 fL — ABNORMAL HIGH (ref 80.0–100.0)
Monocytes Absolute: 0.7 10*3/uL (ref 0.1–1.0)
Monocytes Relative: 10 %
Neutro Abs: 4.3 10*3/uL (ref 1.7–7.7)
Neutrophils Relative %: 58 %
Platelet Count: 151 10*3/uL (ref 150–400)
RBC: 3.24 MIL/uL — ABNORMAL LOW (ref 3.87–5.11)
RDW: 12.7 % (ref 11.5–15.5)
WBC Count: 7.2 10*3/uL (ref 4.0–10.5)
nRBC: 0 % (ref 0.0–0.2)

## 2022-07-05 LAB — IRON AND IRON BINDING CAPACITY (CC-WL,HP ONLY)
Iron: 92 ug/dL (ref 28–170)
Saturation Ratios: 31 % (ref 10.4–31.8)
TIBC: 293 ug/dL (ref 250–450)
UIBC: 201 ug/dL (ref 148–442)

## 2022-07-05 LAB — RETICULOCYTES
Immature Retic Fract: 11.2 % (ref 2.3–15.9)
RBC.: 3.27 MIL/uL — ABNORMAL LOW (ref 3.87–5.11)
Retic Count, Absolute: 102 10*3/uL (ref 19.0–186.0)
Retic Ct Pct: 3.1 % (ref 0.4–3.1)

## 2022-07-05 LAB — FERRITIN: Ferritin: 309 ng/mL — ABNORMAL HIGH (ref 11–307)

## 2022-07-05 MED ORDER — EPOETIN ALFA-EPBX 40000 UNIT/ML IJ SOLN
40000.0000 [IU] | Freq: Once | INTRAMUSCULAR | Status: AC
  Administered 2022-07-05: 40000 [IU] via SUBCUTANEOUS
  Filled 2022-07-05: qty 1

## 2022-07-05 NOTE — Progress Notes (Signed)
Hematology and Oncology Follow Up Visit  Rachael Davenport [id number] [birth year] 77 y.o. 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 She has noted some fatigue and has been through some stress at home due to the sudden loss of two of her nephews. This has been hard on her whole family but thankfully they are helping each other get through this difficult time.  She has not noted any blood loss. No bruising or petechiae.  No fever, chills, n/v, cough, rash, dizziness, SOB, chest pain, palpitations, abdominal pain or changes in bowel or bladder habits.  No swelling, tenderness, numbness or tingling in her extremities at this time.  No falls or syncope.  Appetite and hydration are ok. Weight is 106 lbs.   ECOG Performance Status: 1 - Symptomatic but completely ambulatory  Medications:  Allergies as of 07/05/2022       Reactions   Penicillins Itching, Swelling   Has patient had a PCN reaction causing immediate rash, facial/tongue/throat swelling, SOB or lightheadedness with hypotension: Yes Has patient had a PCN reaction causing severe rash involving mucus membranes or skin necrosis: No Has patient had a PCN reaction that required hospitalization: No Has patient had a PCN reaction occurring within the last 10 years: No If all of the above answers are "NO", then may proceed with Cephalosporin use.   Valsartan Itching, Other (See Comments)   Heart sped up   Amlodipine Itching   Patient can take lower dose Norvasc         Medication List        Accurate as of July 05, 2022  1:40 PM. If you have any questions, ask your nurse or doctor.          amLODipine 2.5 MG tablet Commonly known as: NORVASC Take 2.5 mg  by mouth daily.   aspirin EC 81 MG tablet Take 81 mg by mouth daily. Swallow whole.   carvedilol 3.125 MG tablet Commonly known as: COREG Take 3.125 mg by mouth 2 (two) times daily.   cyanocobalamin 1000 MCG tablet Commonly known as: VITAMIN B12 Take 1,000 mcg by mouth daily.   Estradiol 4 MCG Inst Imvexxy Maintenance Pack 4 mcg vaginal insert  Insert 1 vaginal insert twice a week by vaginal route.   furosemide 20 MG tablet Commonly known as: LASIX Take by mouth.   polyethylene glycol 17 g packet Commonly known as: MIRALAX / GLYCOLAX Take 17 g by mouth daily as needed.   pravastatin 10 MG tablet Commonly known as: PRAVACHOL pravastatin 10 mg tablet   Restasis 0.05 % ophthalmic emulsion Generic drug: cycloSPORINE INT 1 GTT IN OU BID UTD   SUMAtriptan 25 MG tablet Commonly known as: IMITREX Take 25 mg by mouth once as needed for migraine (MAY REPEAT ONCE IN 2 HOURS IF NO RELIEF (MAX 2 TABLETS/24 HOURS)).   Vitamin D (Ergocalciferol) 1.25 MG (50000 UNIT) Caps capsule Commonly known as: DRISDOL Take 50,000 Units by mouth every 7 (seven) days.        Allergies:  Allergies  Allergen Reactions   Penicillins Itching and Swelling    Has patient had a PCN reaction causing immediate rash, facial/tongue/throat swelling, SOB or lightheadedness with hypotension: Yes Has patient  had a PCN reaction causing severe rash involving mucus membranes or skin necrosis: No Has patient had a PCN reaction that required hospitalization: No Has patient had a PCN reaction occurring within the last 10 years: No If all of the above answers are "NO", then may proceed with Cephalosporin use.    Valsartan Itching and Other (See Comments)    Heart sped up   Amlodipine Itching    Patient can take lower dose Norvasc     Past Medical History, Surgical history, Social history, and Family History were reviewed and updated.  Review of Systems: All other 10 point review of systems is negative.    Physical Exam:  weight is 106 lb 12.8 oz (48.4 kg). Her oral temperature is 98.4 F (36.9 C). Her blood pressure is 149/68 (abnormal) and her pulse is 57 (abnormal). Her respiration is 17 and oxygen saturation is 100%.   Wt Readings from Last 3 Encounters:  07/05/22 106 lb 12.8 oz (48.4 kg)  05/04/22 109 lb 3.2 oz (49.5 kg)  05/03/22 109 lb 1.9 oz (49.5 kg)    Ocular: Sclerae unicteric, pupils equal, round and reactive to light Ear-nose-throat: Oropharynx clear, dentition fair Lymphatic: No cervical or supraclavicular adenopathy Lungs no rales or rhonchi, good excursion bilaterally Heart regular rate and rhythm, no murmur appreciated Abd soft, nontender, positive bowel sounds MSK no focal spinal tenderness, no joint edema Neuro: non-focal, well-oriented, appropriate affect Breasts: Deferred   Lab Results  Component Value Date   WBC 7.2 07/05/2022   HGB 10.5 (L) 07/05/2022   HCT 32.5 (L) 07/05/2022   MCV 100.3 (H) 07/05/2022   PLT 151 07/05/2022   Lab Results  Component Value Date   FERRITIN 307 05/03/2022   IRON 83 05/03/2022   TIBC 297 05/03/2022   UIBC 214 05/03/2022   IRONPCTSAT 28 05/03/2022   Lab Results  Component Value Date   RETICCTPCT 3.1 07/05/2022   RBC 3.24 (L) 07/05/2022   RBC 3.27 (L) 07/05/2022   No results found for: "KPAFRELGTCHN", "LAMBDASER", "KAPLAMBRATIO" No results found for: "IGGSERUM", "IGA", "IGMSERUM" No results found for: "TOTALPROTELP", "ALBUMINELP", "A1GS", "A2GS", "BETS", "BETA2SER", "GAMS", "MSPIKE", "SPEI"   Chemistry      Component Value Date/Time   NA 143 07/05/2022 1031   NA 145 02/13/2017 1046   NA 142 12/23/2016 0951   K 4.5 07/05/2022 1031   K 4.6 02/13/2017 1046   K 3.9 12/23/2016 0951   CL 107 07/05/2022 1031   CL 108 02/13/2017 1046   CO2 29 07/05/2022 1031   CO2 28 02/13/2017 1046   CO2 25 12/23/2016 0951   BUN 17 07/05/2022 1031   BUN 22 02/13/2017 1046   BUN 15.0 12/23/2016 0951   CREATININE 1.07 (H)  07/05/2022 1031   CREATININE 1.1 02/13/2017 1046   CREATININE 0.9 12/23/2016 0951      Component Value Date/Time   CALCIUM 9.5 07/05/2022 1031   CALCIUM 9.7 02/13/2017 1046   CALCIUM 9.5 12/23/2016 0951   ALKPHOS 45 07/05/2022 1031   ALKPHOS 60 02/13/2017 1046   ALKPHOS 69 12/23/2016 0951   AST 15 07/05/2022 1031   AST 26 12/23/2016 0951   ALT 11 07/05/2022 1031   ALT 18 02/13/2017 1046   ALT 16 12/23/2016 0951   BILITOT 1.0 07/05/2022 1031   BILITOT 0.96 12/23/2016 0951       Impression and Plan: Ms. Mccomber is a very pleasant 77 yo African American female with anemia of chronic renal insufficiency and erythropoietin deficiency. ESA  given, Hgb 10.5.  Iron studies are pending.  Lab and injection every 3 weeks.  Follow-up in 9 weeks.  Eileen Stanford, NP 4/16/20241:40 PM

## 2022-07-05 NOTE — Patient Instructions (Signed)

## 2022-07-24 ENCOUNTER — Emergency Department (HOSPITAL_BASED_OUTPATIENT_CLINIC_OR_DEPARTMENT_OTHER)
Admission: EM | Admit: 2022-07-24 | Discharge: 2022-07-24 | Disposition: A | Discharge status: Home / Self Care | Payer: Medicare Other | Attending: Emergency Medicine | Admitting: Emergency Medicine

## 2022-07-24 ENCOUNTER — Encounter (HOSPITAL_BASED_OUTPATIENT_CLINIC_OR_DEPARTMENT_OTHER): Payer: Self-pay | Admitting: Emergency Medicine

## 2022-07-24 ENCOUNTER — Other Ambulatory Visit: Payer: Self-pay

## 2022-07-24 DIAGNOSIS — S1086XA Insect bite of other specified part of neck, initial encounter: Secondary | ICD-10-CM | POA: Insufficient documentation

## 2022-07-24 DIAGNOSIS — Z7982 Long term (current) use of aspirin: Secondary | ICD-10-CM | POA: Insufficient documentation

## 2022-07-24 DIAGNOSIS — W57XXXA Bitten or stung by nonvenomous insect and other nonvenomous arthropods, initial encounter: Secondary | ICD-10-CM | POA: Insufficient documentation

## 2022-07-24 NOTE — ED Triage Notes (Signed)
Pt c/o 2 bumps to RT side of neck that itch; appear to be insect bites

## 2022-07-24 NOTE — Discharge Instructions (Addendum)
Thank you for allowing me to be a part of your care.    I recommend continuing to use hydrocortisone cream a few times a day on the insect bites on your neck.  Inspect your home for insects, especially your sheets, bedding, and mattress (looking for bed bugs).    If you notice any insects in your house, I recommend calling an exterminator.  Be sure to wash all of your bedding and clothing in hot water to kill any lingering insects.    Follow-up with your primary care provider if they do not improve or if they worsen.

## 2022-07-24 NOTE — ED Provider Notes (Signed)
[place] [facility] Provider Note   CSN: [id number] Arrival date & time: 07/24/22  1448     History  Chief Complaint  Patient presents with   Insect Bite    Rachael Davenport is a 77 y.o. female presents to the ED complaining of 2 small insect bites to the right side of her neck that itch.  Patient states she also had 2 small bites on her left arm that have improved.  She has been putting Neosporin and hydrocortisone cream on them.  She saw a small black bug on her clothing yesterday.  Denies fever, wounds, or rash.        Home Medications Prior to Admission medications   Medication Sig Start Date End Date Taking? Authorizing Provider  amLODipine (NORVASC) 2.5 MG tablet Take 2.5 mg by mouth daily. 05/25/21   [provider]  aspirin EC 81 MG tablet Take 81 mg by mouth daily. Swallow whole.    [provider]  carvedilol (COREG) 3.125 MG tablet Take 3.125 mg by mouth 2 (two) times daily. 04/09/20   [provider]  cyanocobalamin (VITAMIN B12) 1000 MCG tablet Take 1,000 mcg by mouth daily. Patient not taking: Reported on 07/05/2022    [provider]  Estradiol 4 MCG INST Imvexxy Maintenance Pack 4 mcg vaginal insert  Insert 1 vaginal insert twice a week by vaginal route.    [provider]  furosemide (LASIX) 20 MG tablet Take by mouth. 05/28/20   [provider]  polyethylene glycol (MIRALAX / GLYCOLAX) 17 g packet Take 17 g by mouth daily as needed.    [provider]  pravastatin (PRAVACHOL) 10 MG tablet pravastatin 10 mg tablet    [provider]  RESTASIS 0.05 % ophthalmic emulsion INT 1 GTT IN OU BID UTD 04/05/17   [provider]  SUMAtriptan (IMITREX) 25 MG tablet Take 25 mg by mouth once as needed for migraine (MAY REPEAT ONCE IN 2 HOURS IF NO RELIEF (MAX 2 TABLETS/24 HOURS)).  06/08/15   [provider]  Vitamin D, Ergocalciferol, (DRISDOL) 50000  units CAPS capsule Take 50,000 Units by mouth every 7 (seven) days. 05/12/15   [provider]      Allergies    Penicillins, Valsartan, and Amlodipine    Review of Systems   Review of Systems  Constitutional:  Negative for fever.  Skin:  Negative for rash and wound.       Insect bites    Physical Exam Updated Vital Signs BP 138/72 (BP Location: Right Arm)   Pulse 60   Temp 98.6 F (37 C) (Oral)   Resp 16   Ht 5\' 2"  (1.575 m)   Wt 48.5 kg   SpO2 100%   BMI 19.57 kg/m  Physical Exam Vitals and nursing note reviewed.  Constitutional:      General: She is not in acute distress.    Appearance: Normal appearance. She is not ill-appearing or diaphoretic.  Cardiovascular:     Rate and Rhythm: Normal rate and regular rhythm.  Pulmonary:     Effort: Pulmonary effort is normal.  Lymphadenopathy:     Cervical: No cervical adenopathy.  Skin:    Comments: 2 small erythematous papules to the right side of the neck consistent with insect bite.  No obvious central bite marks or areas of necrosis.    Neurological:     Mental Status: She is alert. Mental status is at baseline.  Psychiatric:  Mood and Affect: Mood normal.        Behavior: Behavior normal.     ED Results / Procedures / Treatments   Labs (all labs ordered are listed, but only abnormal results are displayed) Labs Reviewed - No data to display  EKG None  Radiology No results found.  Procedures Procedures    Medications Ordered in ED Medications - No data to display  ED Course/ Medical Decision Making/ A&P                             Medical Decision Making  Patient presents to the ED with concern of insect bites to the right side of her neck and her left arm.  Patient has been putting Neosporin and hydrocortisone cream on these areas to help.  She states the areas on the left arm have improved.  She did notice a small black bug on her yesterday.   Exam significant for 2 small  erythematous papules to the right side of the neck consistent with an insect bite.  No obvious central bite marks or areas of necrosis.  No cervical lymphadenopathy.    Discussed with patient continued supportive care measures including continuing to use hydrocortisone cream on the bites.  Recommended patient inspect her house and mattress for insects, especially for bed bugs.  Patient does not have pets and she has not been in wooded areas or outside much.   The patient has been appropriately medically screened and/or stabilized in the ED. I have low suspicion for any other emergent medical condition which would require further screening, evaluation or treatment in the ED or require inpatient management. At time of discharge the patient is hemodynamically stable and in no acute distress. I have answered all questions. Patient is agreeable with discharge plan. We discussed strict return precautions for returning to the emergency department and they verbalized understanding.           Final Clinical Impression(s) / ED Diagnoses Final diagnoses:  Insect bite of other part of neck, initial encounter    Rx / DC Orders ED Discharge Orders     None         Lenard Simmer, PA-C 07/24/22 1554    Glyn Ade, MD 07/25/22 1535

## 2022-07-26 ENCOUNTER — Ambulatory Visit: Payer: Medicare Other

## 2022-07-26 ENCOUNTER — Other Ambulatory Visit: Payer: Medicare Other

## 2022-07-27 ENCOUNTER — Inpatient Hospital Stay: Payer: Medicare Other

## 2022-07-27 ENCOUNTER — Telehealth: Payer: Self-pay | Admitting: *Deleted

## 2022-07-27 ENCOUNTER — Inpatient Hospital Stay: Payer: Medicare Other | Attending: Family

## 2022-07-27 DIAGNOSIS — D500 Iron deficiency anemia secondary to blood loss (chronic): Secondary | ICD-10-CM | POA: Diagnosis present

## 2022-07-27 DIAGNOSIS — D631 Anemia in chronic kidney disease: Secondary | ICD-10-CM

## 2022-07-27 LAB — CBC WITH DIFFERENTIAL (CANCER CENTER ONLY)
Abs Immature Granulocytes: 0.03 10*3/uL (ref 0.00–0.07)
Basophils Absolute: 0 10*3/uL (ref 0.0–0.1)
Basophils Relative: 1 %
Eosinophils Absolute: 0.2 10*3/uL (ref 0.0–0.5)
Eosinophils Relative: 5 %
HCT: 34.5 % — ABNORMAL LOW (ref 36.0–46.0)
Hemoglobin: 11 g/dL — ABNORMAL LOW (ref 12.0–15.0)
Immature Granulocytes: 1 %
Lymphocytes Relative: 35 %
Lymphs Abs: 1.3 10*3/uL (ref 0.7–4.0)
MCH: 32.3 pg (ref 26.0–34.0)
MCHC: 31.9 g/dL (ref 30.0–36.0)
MCV: 101.2 fL — ABNORMAL HIGH (ref 80.0–100.0)
Monocytes Absolute: 0.4 10*3/uL (ref 0.1–1.0)
Monocytes Relative: 11 %
Neutro Abs: 1.7 10*3/uL (ref 1.7–7.7)
Neutrophils Relative %: 47 %
Platelet Count: 120 10*3/uL — ABNORMAL LOW (ref 150–400)
RBC: 3.41 MIL/uL — ABNORMAL LOW (ref 3.87–5.11)
RDW: 12.6 % (ref 11.5–15.5)
WBC Count: 3.6 10*3/uL — ABNORMAL LOW (ref 4.0–10.5)
nRBC: 0 % (ref 0.0–0.2)

## 2022-07-27 LAB — IRON AND IRON BINDING CAPACITY (CC-WL,HP ONLY)
Iron: 103 ug/dL (ref 28–170)
Saturation Ratios: 36 % — ABNORMAL HIGH (ref 10.4–31.8)
TIBC: 290 ug/dL (ref 250–450)
UIBC: 187 ug/dL (ref 148–442)

## 2022-07-27 LAB — CMP (CANCER CENTER ONLY)
ALT: 13 U/L (ref 0–44)
AST: 21 U/L (ref 15–41)
Albumin: 4.3 g/dL (ref 3.5–5.0)
Alkaline Phosphatase: 47 U/L (ref 38–126)
Anion gap: 6 (ref 5–15)
BUN: 22 mg/dL (ref 8–23)
CO2: 29 mmol/L (ref 22–32)
Calcium: 10 mg/dL (ref 8.9–10.3)
Chloride: 107 mmol/L (ref 98–111)
Creatinine: 1.03 mg/dL — ABNORMAL HIGH (ref 0.44–1.00)
GFR, Estimated: 56 mL/min — ABNORMAL LOW (ref >60)
Glucose, Bld: 112 mg/dL — ABNORMAL HIGH (ref 70–99)
Potassium: 4.7 mmol/L (ref 3.5–5.1)
Sodium: 142 mmol/L (ref 135–145)
Total Bilirubin: 0.9 mg/dL (ref 0.3–1.2)
Total Protein: 7 g/dL (ref 6.5–8.1)

## 2022-07-27 LAB — RETICULOCYTES
Immature Retic Fract: 6.8 % (ref 2.3–15.9)
RBC.: 3.42 MIL/uL — ABNORMAL LOW (ref 3.87–5.11)
Retic Count, Absolute: 40 10*3/uL (ref 19.0–186.0)
Retic Ct Pct: 1.2 % (ref 0.4–3.1)

## 2022-07-27 LAB — FERRITIN: Ferritin: 253 ng/mL (ref 11–307)

## 2022-07-27 NOTE — Progress Notes (Signed)
No injection hgb 1. Pt notified.

## 2022-07-27 NOTE — Telephone Encounter (Signed)
Pt Hgb 11. No injection needed. Informed pt in waiting room.

## 2022-07-28 ENCOUNTER — Other Ambulatory Visit: Payer: Self-pay | Admitting: Obstetrics and Gynecology

## 2022-07-28 DIAGNOSIS — M810 Age-related osteoporosis without current pathological fracture: Secondary | ICD-10-CM

## 2022-08-04 IMAGING — MG DIGITAL SCREENING BILAT W/ TOMO W/ CAD
6 of 10 series · 6 of 30 positions shown · non-contrast
Comparison: Previous exam(s).

CLINICAL DATA: Screening.

EXAM:
DIGITAL SCREENING BILATERAL MAMMOGRAM WITH TOMO AND CAD

[R MLO synth-2D (1 of 2)]
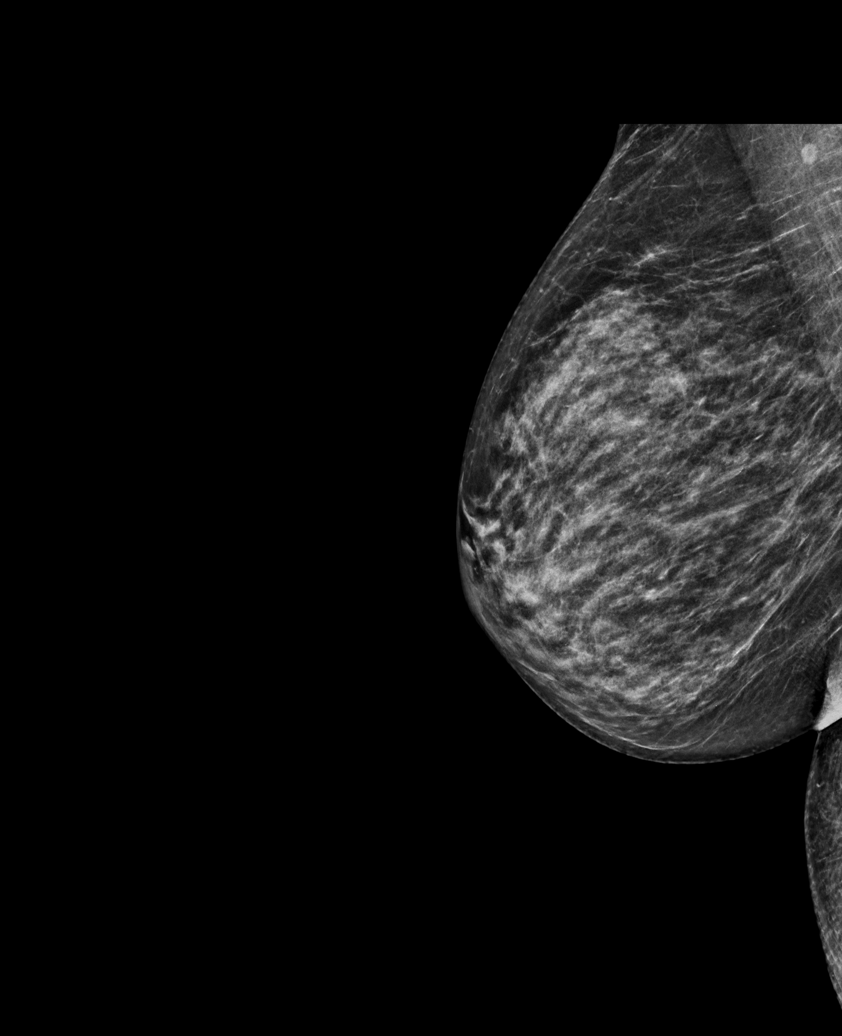

[R CC synth-2D]
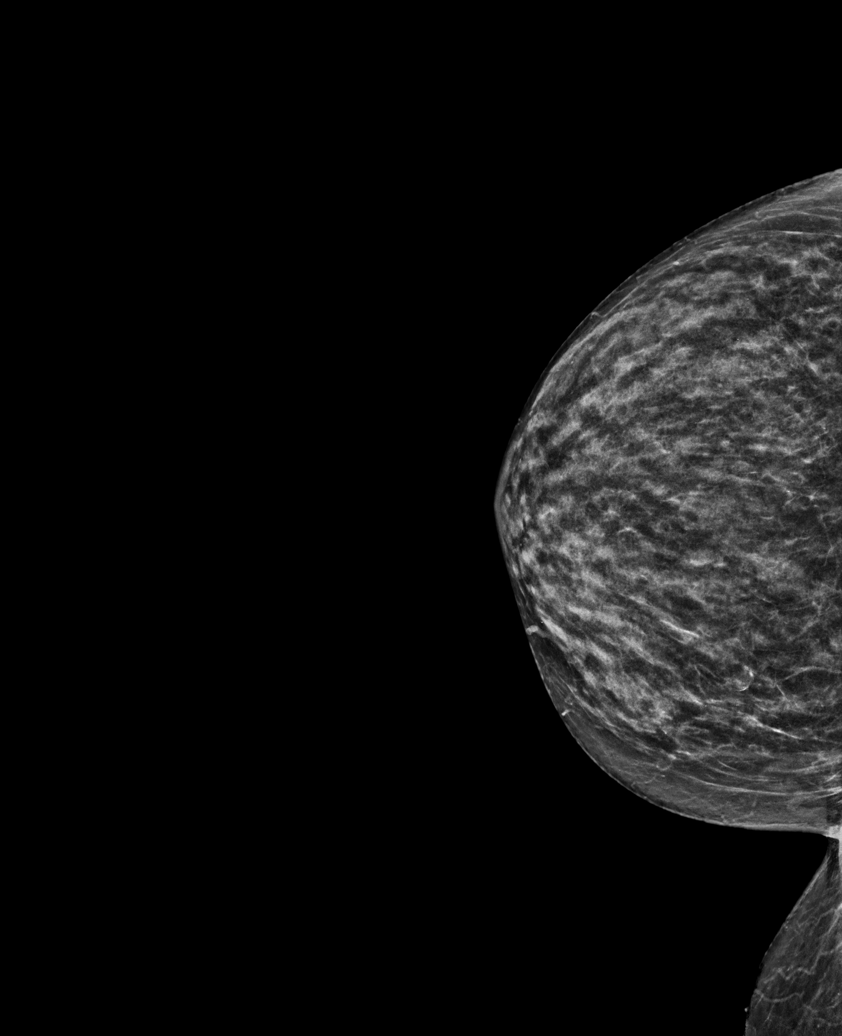

[L MLO synth-2D]
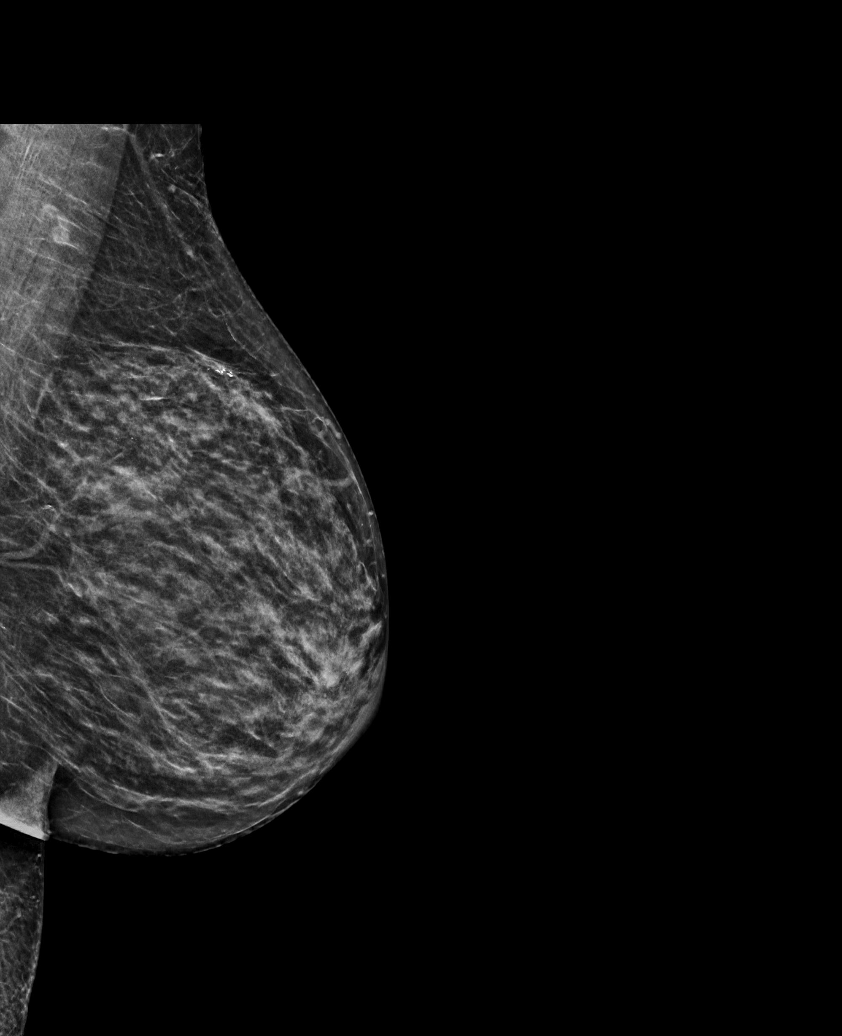

[R MLO synth-2D (2 of 2)]
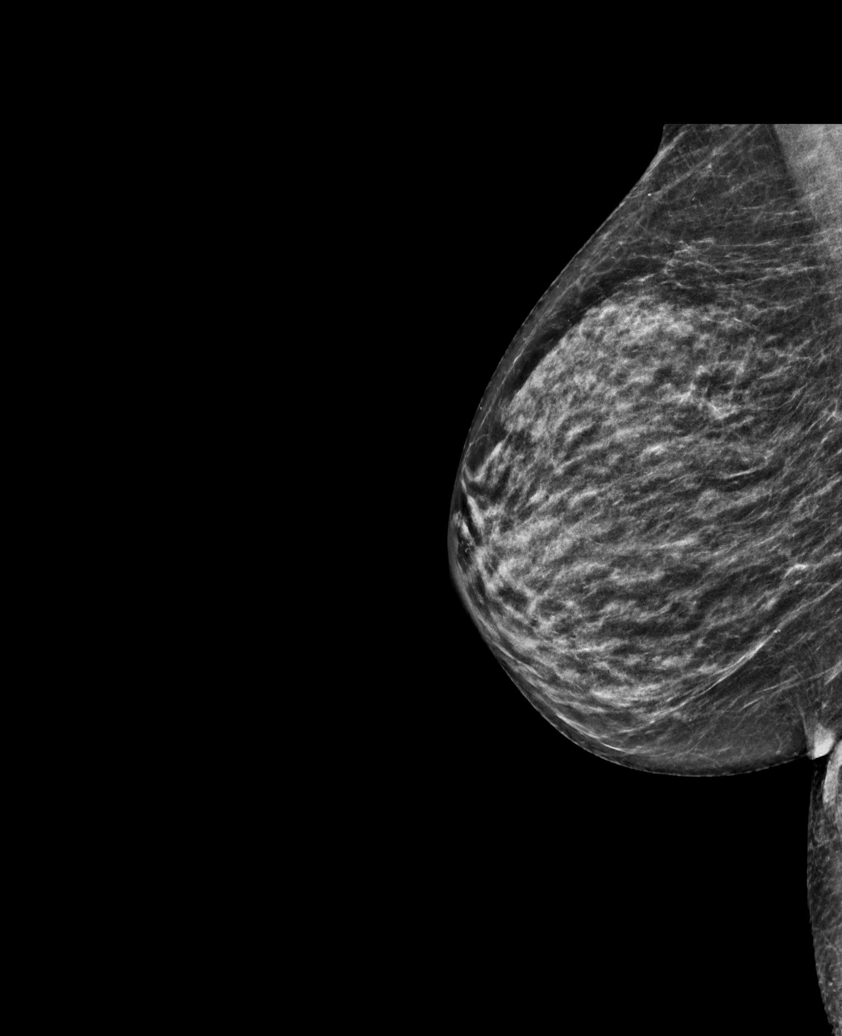

[L CC synth-2D]
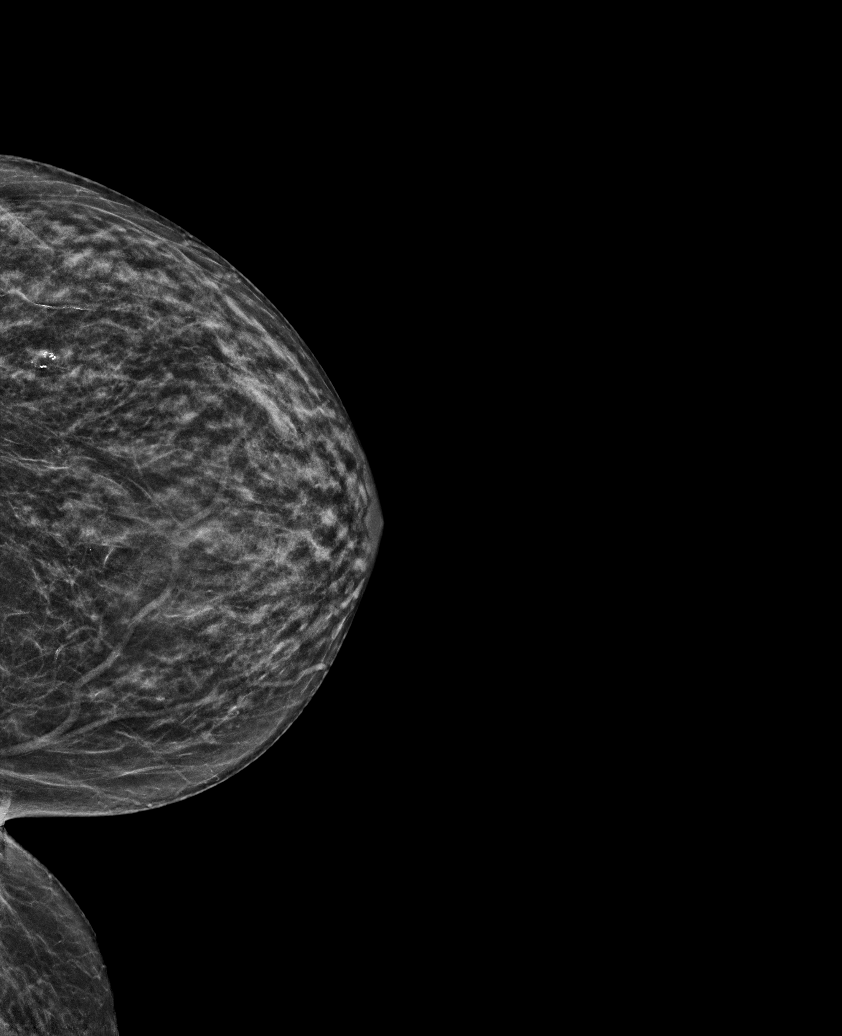

[R MLO tomo · tomo slice 30/59.0]
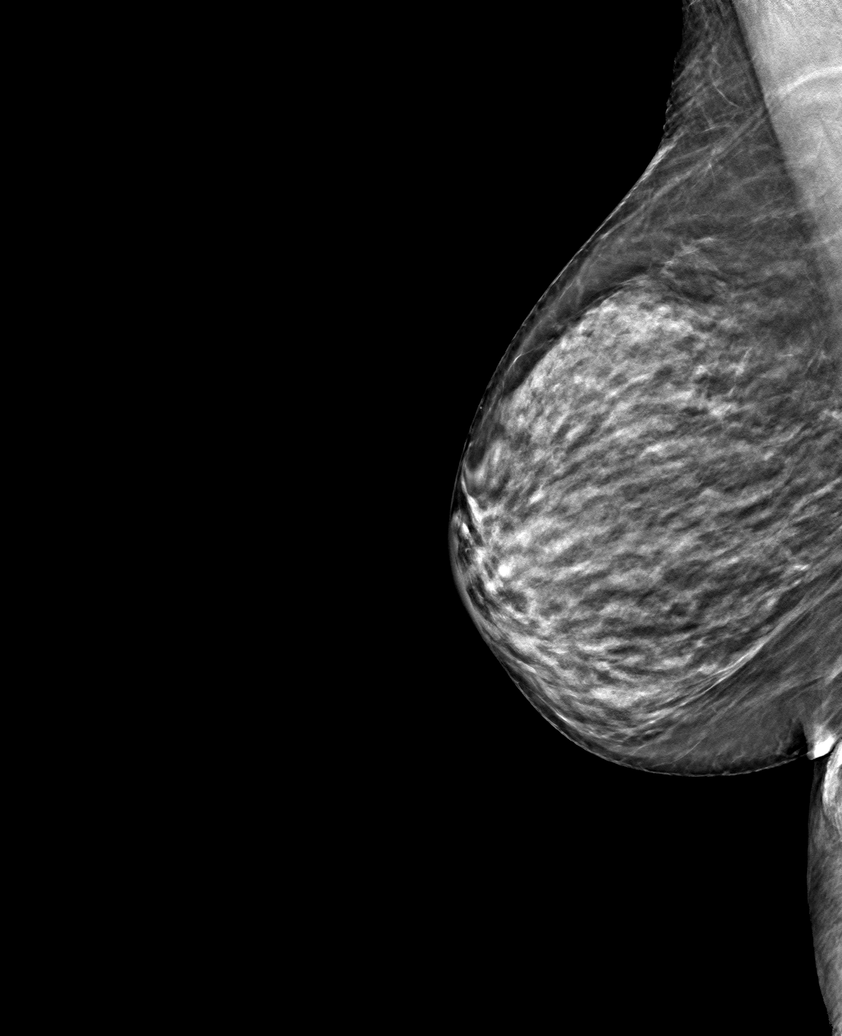

[6 of 30 positions shown; findings below may reference images not displayed]

ACR Breast Density Category c: The breast tissue is heterogeneously
dense, which may obscure small masses.
FINDINGS: There are no findings suspicious for malignancy. Images were
processed with CAD.
IMPRESSION: No mammographic evidence of malignancy. A result letter of this
screening mammogram will be mailed directly to the patient.

RECOMMENDATION:
Screening mammogram in one year. (Code:FT-U-LHB)

BI-RADS CATEGORY  1: Negative.

## 2022-08-16 ENCOUNTER — Inpatient Hospital Stay: Payer: Medicare Other

## 2022-08-16 DIAGNOSIS — D500 Iron deficiency anemia secondary to blood loss (chronic): Secondary | ICD-10-CM | POA: Diagnosis not present

## 2022-08-16 DIAGNOSIS — D631 Anemia in chronic kidney disease: Secondary | ICD-10-CM

## 2022-08-16 LAB — CBC WITH DIFFERENTIAL (CANCER CENTER ONLY)
Abs Immature Granulocytes: 0.03 10*3/uL (ref 0.00–0.07)
Basophils Absolute: 0 10*3/uL (ref 0.0–0.1)
Basophils Relative: 1 %
Eosinophils Absolute: 0.1 10*3/uL (ref 0.0–0.5)
Eosinophils Relative: 2 %
HCT: 34.4 % — ABNORMAL LOW (ref 36.0–46.0)
Hemoglobin: 11 g/dL — ABNORMAL LOW (ref 12.0–15.0)
Immature Granulocytes: 1 %
Lymphocytes Relative: 36 %
Lymphs Abs: 1.7 10*3/uL (ref 0.7–4.0)
MCH: 32.3 pg (ref 26.0–34.0)
MCHC: 32 g/dL (ref 30.0–36.0)
MCV: 100.9 fL — ABNORMAL HIGH (ref 80.0–100.0)
Monocytes Absolute: 0.5 10*3/uL (ref 0.1–1.0)
Monocytes Relative: 10 %
Neutro Abs: 2.4 10*3/uL (ref 1.7–7.7)
Neutrophils Relative %: 50 %
Platelet Count: 154 10*3/uL (ref 150–400)
RBC: 3.41 MIL/uL — ABNORMAL LOW (ref 3.87–5.11)
RDW: 12.2 % (ref 11.5–15.5)
WBC Count: 4.8 10*3/uL (ref 4.0–10.5)
nRBC: 0 % (ref 0.0–0.2)

## 2022-08-16 LAB — CMP (CANCER CENTER ONLY)
ALT: 11 U/L (ref 0–44)
AST: 20 U/L (ref 15–41)
Albumin: 4.4 g/dL (ref 3.5–5.0)
Alkaline Phosphatase: 51 U/L (ref 38–126)
Anion gap: 8 (ref 5–15)
BUN: 26 mg/dL — ABNORMAL HIGH (ref 8–23)
CO2: 29 mmol/L (ref 22–32)
Calcium: 10 mg/dL (ref 8.9–10.3)
Chloride: 105 mmol/L (ref 98–111)
Creatinine: 0.95 mg/dL (ref 0.44–1.00)
GFR, Estimated: >60 mL/min (ref >60)
Glucose, Bld: 99 mg/dL (ref 70–99)
Potassium: 3.9 mmol/L (ref 3.5–5.1)
Sodium: 142 mmol/L (ref 135–145)
Total Bilirubin: 0.9 mg/dL (ref 0.3–1.2)
Total Protein: 6.8 g/dL (ref 6.5–8.1)

## 2022-08-16 LAB — FERRITIN: Ferritin: 274 ng/mL (ref 11–307)

## 2022-08-16 LAB — IRON AND IRON BINDING CAPACITY (CC-WL,HP ONLY)
Iron: 56 ug/dL (ref 28–170)
Saturation Ratios: 19 % (ref 10.4–31.8)
TIBC: 297 ug/dL (ref 250–450)
UIBC: 241 ug/dL (ref 148–442)

## 2022-08-16 LAB — RETICULOCYTES
Immature Retic Fract: 10.4 % (ref 2.3–15.9)
RBC.: 3.37 MIL/uL — ABNORMAL LOW (ref 3.87–5.11)
Retic Count, Absolute: 56.3 10*3/uL (ref 19.0–186.0)
Retic Ct Pct: 1.7 % (ref 0.4–3.1)

## 2022-09-02 ENCOUNTER — Other Ambulatory Visit: Payer: Self-pay | Admitting: Internal Medicine

## 2022-09-02 ENCOUNTER — Ambulatory Visit
Admission: RE | Admit: 2022-09-02 | Discharge: 2022-09-02 | Disposition: A | Discharge status: Home / Self Care | Payer: Medicare Other | Source: Ambulatory Visit | Attending: Obstetrics and Gynecology | Admitting: Obstetrics and Gynecology

## 2022-09-02 DIAGNOSIS — M810 Age-related osteoporosis without current pathological fracture: Secondary | ICD-10-CM

## 2022-09-02 DIAGNOSIS — Z0000 Encounter for general adult medical examination without abnormal findings: Secondary | ICD-10-CM

## 2022-09-06 ENCOUNTER — Inpatient Hospital Stay (HOSPITAL_BASED_OUTPATIENT_CLINIC_OR_DEPARTMENT_OTHER): Payer: Medicare Other | Admitting: Family

## 2022-09-06 ENCOUNTER — Other Ambulatory Visit: Payer: Medicare Other

## 2022-09-06 ENCOUNTER — Inpatient Hospital Stay: Payer: Medicare Other | Attending: Family

## 2022-09-06 ENCOUNTER — Encounter: Payer: Self-pay | Admitting: Family

## 2022-09-06 ENCOUNTER — Inpatient Hospital Stay: Payer: Medicare Other

## 2022-09-06 VITALS — BP 134/60 | HR 56 | Temp 98.2°F | Resp 17 | Wt 108.1 lb

## 2022-09-06 DIAGNOSIS — E611 Iron deficiency: Secondary | ICD-10-CM | POA: Diagnosis not present

## 2022-09-06 DIAGNOSIS — D631 Anemia in chronic kidney disease: Secondary | ICD-10-CM

## 2022-09-06 DIAGNOSIS — N189 Chronic kidney disease, unspecified: Secondary | ICD-10-CM | POA: Insufficient documentation

## 2022-09-06 DIAGNOSIS — D500 Iron deficiency anemia secondary to blood loss (chronic): Secondary | ICD-10-CM

## 2022-09-06 LAB — CBC WITH DIFFERENTIAL (CANCER CENTER ONLY)
Abs Immature Granulocytes: 0.01 10*3/uL (ref 0.00–0.07)
Basophils Absolute: 0 10*3/uL (ref 0.0–0.1)
Basophils Relative: 1 %
Eosinophils Absolute: 0.1 10*3/uL (ref 0.0–0.5)
Eosinophils Relative: 3 %
HCT: 34.1 % — ABNORMAL LOW (ref 36.0–46.0)
Hemoglobin: 10.8 g/dL — ABNORMAL LOW (ref 12.0–15.0)
Immature Granulocytes: 0 %
Lymphocytes Relative: 28 %
Lymphs Abs: 1.1 10*3/uL (ref 0.7–4.0)
MCH: 31.7 pg (ref 26.0–34.0)
MCHC: 31.7 g/dL (ref 30.0–36.0)
MCV: 100 fL (ref 80.0–100.0)
Monocytes Absolute: 0.5 10*3/uL (ref 0.1–1.0)
Monocytes Relative: 12 %
Neutro Abs: 2.2 10*3/uL (ref 1.7–7.7)
Neutrophils Relative %: 56 %
Platelet Count: 147 10*3/uL — ABNORMAL LOW (ref 150–400)
RBC: 3.41 MIL/uL — ABNORMAL LOW (ref 3.87–5.11)
RDW: 12.5 % (ref 11.5–15.5)
WBC Count: 3.9 10*3/uL — ABNORMAL LOW (ref 4.0–10.5)
nRBC: 0 % (ref 0.0–0.2)

## 2022-09-06 LAB — CMP (CANCER CENTER ONLY)
ALT: 18 U/L (ref 0–44)
AST: 27 U/L (ref 15–41)
Albumin: 4 g/dL (ref 3.5–5.0)
Alkaline Phosphatase: 52 U/L (ref 38–126)
Anion gap: 9 (ref 5–15)
BUN: 18 mg/dL (ref 8–23)
CO2: 25 mmol/L (ref 22–32)
Calcium: 9.4 mg/dL (ref 8.9–10.3)
Chloride: 102 mmol/L (ref 98–111)
Creatinine: 0.87 mg/dL (ref 0.44–1.00)
GFR, Estimated: >60 mL/min (ref >60)
Glucose, Bld: 93 mg/dL (ref 70–99)
Potassium: 4.4 mmol/L (ref 3.5–5.1)
Sodium: 136 mmol/L (ref 135–145)
Total Bilirubin: 1.3 mg/dL — ABNORMAL HIGH (ref 0.3–1.2)
Total Protein: 7.5 g/dL (ref 6.5–8.1)

## 2022-09-06 LAB — IRON AND IRON BINDING CAPACITY (CC-WL,HP ONLY)
Iron: 85 ug/dL (ref 28–170)
Saturation Ratios: 28 % (ref 10.4–31.8)
TIBC: 309 ug/dL (ref 250–450)
UIBC: 224 ug/dL (ref 148–442)

## 2022-09-06 LAB — RETICULOCYTES
Immature Retic Fract: 12.9 % (ref 2.3–15.9)
RBC.: 3.4 MIL/uL — ABNORMAL LOW (ref 3.87–5.11)
Retic Count, Absolute: 69.4 10*3/uL (ref 19.0–186.0)
Retic Ct Pct: 2 % (ref 0.4–3.1)

## 2022-09-06 LAB — FERRITIN: Ferritin: 336 ng/mL — ABNORMAL HIGH (ref 11–307)

## 2022-09-06 MED ORDER — EPOETIN ALFA-EPBX 40000 UNIT/ML IJ SOLN
40000.0000 [IU] | Freq: Once | INTRAMUSCULAR | Status: AC
  Administered 2022-09-06: 40000 [IU] via SUBCUTANEOUS
  Filled 2022-09-06: qty 1

## 2022-09-06 NOTE — Patient Instructions (Signed)

## 2022-09-06 NOTE — Progress Notes (Signed)
Hematology and Oncology Follow Up Visit  Drew Mitre [id number] 06-27-1945 77 y.o. 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 Layer is here today for follow-up. She is doing well and has no complaints at this time.  She has occasional episodes of fatigue and palpitations with anxiety.  No issues with blood loss. No bruising or petechiae.  No fever, chills, n/v, cough, rash, dizziness, chest pain, abdominal pain or changes in bowel or bladder habits.  No swelling in her extremities. Pedal pulses are 1+.  Numbness and tingling in her hands with carpal tunnel unchanged from baseline.  No falls or syncope reported.  Appetite and hydration are good. Weight is stable at 108 lbs.   ECOG Performance Status: 1 - Symptomatic but completely ambulatory  Medications:  Allergies as of 09/06/2022       Reactions   Penicillins Itching, Swelling   Has patient had a PCN reaction causing immediate rash, facial/tongue/throat swelling, SOB or lightheadedness with hypotension: Yes Has patient had a PCN reaction causing severe rash involving mucus membranes or skin necrosis: No Has patient had a PCN reaction that required hospitalization: No Has patient had a PCN reaction occurring within the last 10 years: No If all of the above answers are "NO", then may proceed with Cephalosporin use.   Valsartan Itching, Other (See Comments)   Heart sped up   Amlodipine Itching   Patient can take lower dose Norvasc         Medication List        Accurate as of September 06, 2022  1:03 PM. If you have any questions, ask your nurse or doctor.          amLODipine 2.5 MG tablet Commonly known as: NORVASC Take 2.5 mg by mouth daily.   aspirin EC 81 MG tablet Take 81 mg by mouth  daily. Swallow whole.   carvedilol 3.125 MG tablet Commonly known as: COREG Take 3.125 mg by mouth 2 (two) times daily.   cyanocobalamin 1000 MCG tablet Commonly known as: VITAMIN B12 Take 1,000 mcg by mouth daily.   Estradiol 4 MCG Inst Imvexxy Maintenance Pack 4 mcg vaginal insert  Insert 1 vaginal insert twice a week by vaginal route.   furosemide 20 MG tablet Commonly known as: LASIX Take by mouth.   polyethylene glycol 17 g packet Commonly known as: MIRALAX / GLYCOLAX Take 17 g by mouth daily as needed.   pravastatin 10 MG tablet Commonly known as: PRAVACHOL pravastatin 10 mg tablet   Restasis 0.05 % ophthalmic emulsion Generic drug: cycloSPORINE INT 1 GTT IN OU BID UTD   SUMAtriptan 25 MG tablet Commonly known as: IMITREX Take 25 mg by mouth once as needed for migraine (MAY REPEAT ONCE IN 2 HOURS IF NO RELIEF (MAX 2 TABLETS/24 HOURS)).   Vitamin D (Ergocalciferol) 1.25 MG (50000 UNIT) Caps capsule Commonly known as: DRISDOL Take 50,000 Units by mouth every 7 (seven) days.        Allergies:  Allergies  Allergen Reactions   Penicillins Itching and Swelling    Has patient had a PCN reaction causing immediate rash, facial/tongue/throat swelling, SOB or lightheadedness with hypotension: Yes Has patient had a PCN reaction causing severe rash involving mucus membranes or skin necrosis: No  Has patient had a PCN reaction that required hospitalization: No Has patient had a PCN reaction occurring within the last 10 years: No If all of the above answers are "NO", then may proceed with Cephalosporin use.    Valsartan Itching and Other (See Comments)    Heart sped up   Amlodipine Itching    Patient can take lower dose Norvasc     Past Medical History, Surgical history, Social history, and Family History were reviewed and updated.  Review of Systems: All other 10 point review of systems is negative.   Physical Exam:  vitals were not taken for this visit.   Wt  Readings from Last 3 Encounters:  07/24/22 107 lb (48.5 kg)  07/05/22 106 lb 12.8 oz (48.4 kg)  05/04/22 109 lb 3.2 oz (49.5 kg)    Ocular: Sclerae unicteric, pupils equal, round and reactive to light Ear-nose-throat: Oropharynx clear, dentition fair Lymphatic: No cervical or supraclavicular adenopathy Lungs no rales or rhonchi, good excursion bilaterally Heart regular rate and rhythm, no murmur appreciated Abd soft, nontender, positive bowel sounds MSK no focal spinal tenderness, no joint edema Neuro: non-focal, well-oriented, appropriate affect Breasts: Deferred  Lab Results  Component Value Date   WBC 4.8 08/16/2022   HGB 11.0 (L) 08/16/2022   HCT 34.4 (L) 08/16/2022   MCV 100.9 (H) 08/16/2022   PLT 154 08/16/2022   Lab Results  Component Value Date   FERRITIN 274 08/16/2022   IRON 56 08/16/2022   TIBC 297 08/16/2022   UIBC 241 08/16/2022   IRONPCTSAT 19 08/16/2022   Lab Results  Component Value Date   RETICCTPCT 1.7 08/16/2022   RBC 3.37 (L) 08/16/2022   RBC 3.41 (L) 08/16/2022   No results found for: "KPAFRELGTCHN", "LAMBDASER", "KAPLAMBRATIO" No results found for: "IGGSERUM", "IGA", "IGMSERUM" No results found for: "TOTALPROTELP", "ALBUMINELP", "A1GS", "A2GS", "BETS", "BETA2SER", "GAMS", "MSPIKE", "SPEI"   Chemistry      Component Value Date/Time   NA 142 08/16/2022 0930   NA 145 02/13/2017 1046   NA 142 12/23/2016 0951   K 3.9 08/16/2022 0930   K 4.6 02/13/2017 1046   K 3.9 12/23/2016 0951   CL 105 08/16/2022 0930   CL 108 02/13/2017 1046   CO2 29 08/16/2022 0930   CO2 28 02/13/2017 1046   CO2 25 12/23/2016 0951   BUN 26 (H) 08/16/2022 0930   BUN 22 02/13/2017 1046   BUN 15.0 12/23/2016 0951   CREATININE 0.95 08/16/2022 0930   CREATININE 1.1 02/13/2017 1046   CREATININE 0.9 12/23/2016 0951      Component Value Date/Time   CALCIUM 10.0 08/16/2022 0930   CALCIUM 9.7 02/13/2017 1046   CALCIUM 9.5 12/23/2016 0951   ALKPHOS 51 08/16/2022 0930    ALKPHOS 60 02/13/2017 1046   ALKPHOS 69 12/23/2016 0951   AST 20 08/16/2022 0930   AST 26 12/23/2016 0951   ALT 11 08/16/2022 0930   ALT 18 02/13/2017 1046   ALT 16 12/23/2016 0951   BILITOT 0.9 08/16/2022 0930   BILITOT 0.96 12/23/2016 0951       Impression and Plan: Ms. Lumbreras is a very pleasant 77 yo African American female with anemia of chronic renal insufficiency and erythropoietin deficiency. ESA given, Hgb 10.8.  Iron studies are pending.   Lab and injection every 3 weeks.  Follow-up in 9 weeks.  Eileen Stanford, NP 6/18/20241:03 PM

## 2022-09-09 ENCOUNTER — Telehealth: Payer: Self-pay | Admitting: Medical Oncology

## 2022-09-09 NOTE — Telephone Encounter (Signed)
Contacted pt to schedule an appt per 09/06/22 LOS. Unable to reach via phone, voicemail was left.

## 2022-09-26 ENCOUNTER — Inpatient Hospital Stay: Payer: Medicare Other

## 2022-09-26 ENCOUNTER — Inpatient Hospital Stay: Payer: Medicare Other | Attending: Family

## 2022-10-24 ENCOUNTER — Inpatient Hospital Stay: Payer: Medicare Other | Attending: Family

## 2022-10-24 ENCOUNTER — Inpatient Hospital Stay: Payer: Medicare Other

## 2022-10-24 VITALS — BP 122/58 | HR 73 | Temp 98.8°F | Resp 16

## 2022-10-24 DIAGNOSIS — R202 Paresthesia of skin: Secondary | ICD-10-CM | POA: Insufficient documentation

## 2022-10-24 DIAGNOSIS — D500 Iron deficiency anemia secondary to blood loss (chronic): Secondary | ICD-10-CM

## 2022-10-24 DIAGNOSIS — D509 Iron deficiency anemia, unspecified: Secondary | ICD-10-CM | POA: Diagnosis not present

## 2022-10-24 DIAGNOSIS — N189 Chronic kidney disease, unspecified: Secondary | ICD-10-CM | POA: Insufficient documentation

## 2022-10-24 DIAGNOSIS — D631 Anemia in chronic kidney disease: Secondary | ICD-10-CM | POA: Diagnosis present

## 2022-10-24 DIAGNOSIS — M7989 Other specified soft tissue disorders: Secondary | ICD-10-CM | POA: Diagnosis not present

## 2022-10-24 DIAGNOSIS — R200 Anesthesia of skin: Secondary | ICD-10-CM | POA: Insufficient documentation

## 2022-10-24 LAB — CBC WITH DIFFERENTIAL (CANCER CENTER ONLY)
Abs Immature Granulocytes: 0.01 10*3/uL (ref 0.00–0.07)
Basophils Absolute: 0 10*3/uL (ref 0.0–0.1)
Basophils Relative: 1 %
Eosinophils Absolute: 0.1 10*3/uL (ref 0.0–0.5)
Eosinophils Relative: 3 %
HCT: 32.5 % — ABNORMAL LOW (ref 36.0–46.0)
Hemoglobin: 10.5 g/dL — ABNORMAL LOW (ref 12.0–15.0)
Immature Granulocytes: 0 %
Lymphocytes Relative: 31 %
Lymphs Abs: 1.3 10*3/uL (ref 0.7–4.0)
MCH: 31.9 pg (ref 26.0–34.0)
MCHC: 32.3 g/dL (ref 30.0–36.0)
MCV: 98.8 fL (ref 80.0–100.0)
Monocytes Absolute: 0.4 10*3/uL (ref 0.1–1.0)
Monocytes Relative: 10 %
Neutro Abs: 2.2 10*3/uL (ref 1.7–7.7)
Neutrophils Relative %: 55 %
Platelet Count: 140 10*3/uL — ABNORMAL LOW (ref 150–400)
RBC: 3.29 MIL/uL — ABNORMAL LOW (ref 3.87–5.11)
RDW: 12.6 % (ref 11.5–15.5)
WBC Count: 4.1 10*3/uL (ref 4.0–10.5)
nRBC: 0 % (ref 0.0–0.2)

## 2022-10-24 LAB — CMP (CANCER CENTER ONLY)
ALT: 11 U/L (ref 0–44)
AST: 19 U/L (ref 15–41)
Albumin: 4.2 g/dL (ref 3.5–5.0)
Alkaline Phosphatase: 51 U/L (ref 38–126)
Anion gap: 7 (ref 5–15)
BUN: 20 mg/dL (ref 8–23)
CO2: 30 mmol/L (ref 22–32)
Calcium: 9.8 mg/dL (ref 8.9–10.3)
Chloride: 103 mmol/L (ref 98–111)
Creatinine: 1.19 mg/dL — ABNORMAL HIGH (ref 0.44–1.00)
GFR, Estimated: 47 mL/min — ABNORMAL LOW (ref >60)
Glucose, Bld: 107 mg/dL — ABNORMAL HIGH (ref 70–99)
Potassium: 4.2 mmol/L (ref 3.5–5.1)
Sodium: 140 mmol/L (ref 135–145)
Total Bilirubin: 0.8 mg/dL (ref 0.3–1.2)
Total Protein: 7.2 g/dL (ref 6.5–8.1)

## 2022-10-24 MED ORDER — EPOETIN ALFA-EPBX 40000 UNIT/ML IJ SOLN
40000.0000 [IU] | Freq: Once | INTRAMUSCULAR | Status: AC
  Administered 2022-10-24: 40000 [IU] via SUBCUTANEOUS
  Filled 2022-10-24: qty 1

## 2022-10-24 NOTE — Patient Instructions (Signed)

## 2022-11-14 ENCOUNTER — Inpatient Hospital Stay: Payer: Medicare Other

## 2022-11-14 ENCOUNTER — Inpatient Hospital Stay: Payer: Medicare Other | Admitting: Family

## 2022-11-15 ENCOUNTER — Other Ambulatory Visit: Payer: Self-pay

## 2022-11-15 ENCOUNTER — Inpatient Hospital Stay: Payer: Medicare Other

## 2022-11-15 ENCOUNTER — Inpatient Hospital Stay (HOSPITAL_BASED_OUTPATIENT_CLINIC_OR_DEPARTMENT_OTHER): Payer: Medicare Other | Admitting: Medical Oncology

## 2022-11-15 ENCOUNTER — Encounter: Payer: Self-pay | Admitting: Medical Oncology

## 2022-11-15 VITALS — BP 142/57 | HR 62 | Temp 98.2°F | Resp 19 | Ht 61.0 in | Wt 112.8 lb

## 2022-11-15 DIAGNOSIS — N183 Chronic kidney disease, stage 3 unspecified: Secondary | ICD-10-CM

## 2022-11-15 DIAGNOSIS — N189 Chronic kidney disease, unspecified: Secondary | ICD-10-CM | POA: Diagnosis not present

## 2022-11-15 DIAGNOSIS — D631 Anemia in chronic kidney disease: Secondary | ICD-10-CM

## 2022-11-15 DIAGNOSIS — D500 Iron deficiency anemia secondary to blood loss (chronic): Secondary | ICD-10-CM

## 2022-11-15 LAB — CBC WITH DIFFERENTIAL (CANCER CENTER ONLY)
Abs Immature Granulocytes: 0.01 10*3/uL (ref 0.00–0.07)
Basophils Absolute: 0 10*3/uL (ref 0.0–0.1)
Basophils Relative: 1 %
Eosinophils Absolute: 0.1 10*3/uL (ref 0.0–0.5)
Eosinophils Relative: 2 %
HCT: 35.4 % — ABNORMAL LOW (ref 36.0–46.0)
Hemoglobin: 11.3 g/dL — ABNORMAL LOW (ref 12.0–15.0)
Immature Granulocytes: 0 %
Lymphocytes Relative: 34 %
Lymphs Abs: 1.4 10*3/uL (ref 0.7–4.0)
MCH: 32.1 pg (ref 26.0–34.0)
MCHC: 31.9 g/dL (ref 30.0–36.0)
MCV: 100.6 fL — ABNORMAL HIGH (ref 80.0–100.0)
Monocytes Absolute: 0.4 10*3/uL (ref 0.1–1.0)
Monocytes Relative: 9 %
Neutro Abs: 2.2 10*3/uL (ref 1.7–7.7)
Neutrophils Relative %: 54 %
Platelet Count: 126 10*3/uL — ABNORMAL LOW (ref 150–400)
RBC: 3.52 MIL/uL — ABNORMAL LOW (ref 3.87–5.11)
RDW: 13.2 % (ref 11.5–15.5)
WBC Count: 4.1 10*3/uL (ref 4.0–10.5)
nRBC: 0 % (ref 0.0–0.2)

## 2022-11-15 LAB — IRON AND IRON BINDING CAPACITY (CC-WL,HP ONLY)
Iron: 97 ug/dL (ref 28–170)
Saturation Ratios: 32 % — ABNORMAL HIGH (ref 10.4–31.8)
TIBC: 304 ug/dL (ref 250–450)
UIBC: 207 ug/dL (ref 148–442)

## 2022-11-15 LAB — CMP (CANCER CENTER ONLY)
ALT: 12 U/L (ref 0–44)
AST: 20 U/L (ref 15–41)
Albumin: 4.4 g/dL (ref 3.5–5.0)
Alkaline Phosphatase: 51 U/L (ref 38–126)
Anion gap: 7 (ref 5–15)
BUN: 20 mg/dL (ref 8–23)
CO2: 29 mmol/L (ref 22–32)
Calcium: 10.2 mg/dL (ref 8.9–10.3)
Chloride: 104 mmol/L (ref 98–111)
Creatinine: 0.91 mg/dL (ref 0.44–1.00)
GFR, Estimated: >60 mL/min (ref >60)
Glucose, Bld: 97 mg/dL (ref 70–99)
Potassium: 4.3 mmol/L (ref 3.5–5.1)
Sodium: 140 mmol/L (ref 135–145)
Total Bilirubin: 1.3 mg/dL — ABNORMAL HIGH (ref 0.3–1.2)
Total Protein: 7.3 g/dL (ref 6.5–8.1)

## 2022-11-15 LAB — RETICULOCYTES
Immature Retic Fract: 5.2 % (ref 2.3–15.9)
RBC.: 3.53 MIL/uL — ABNORMAL LOW (ref 3.87–5.11)
Retic Count, Absolute: 46.6 10*3/uL (ref 19.0–186.0)
Retic Ct Pct: 1.3 % (ref 0.4–3.1)

## 2022-11-15 LAB — FERRITIN: Ferritin: 255 ng/mL (ref 11–307)

## 2022-11-15 NOTE — Progress Notes (Signed)
Hematology and Oncology Follow Up Visit  Rachael Davenport [id number] 02/16/1946 77 y.o. 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   She states that she is doing well and has no concerns at this time.   No issues with blood loss. No bruising or petechiae.  No fever, chills, n/v, cough, rash, dizziness, chest pain, abdominal pain or changes in bowel or bladder habits.  Mild swelling in her extremities for which she takes Lasix PRN. Pedal pulses are 1+.  Numbness and tingling in her hands with carpal tunnel unchanged from baseline. She is thinking about getting the release surgery next month. No falls or syncope reported.  Mild hemorrhoidal bleeding. She is due for a colonoscopy and is getting this set up soon.  Appetite and hydration are good.  Wt Readings from Last 3 Encounters:  11/15/22 112 lb 12.8 oz (51.2 kg)  09/06/22 108 lb 1.9 oz (49 kg)  07/24/22 107 lb (48.5 kg)    ECOG Performance Status: 1 - Symptomatic but completely ambulatory  Medications:  Allergies as of 11/15/2022       Reactions   Penicillins Itching, Swelling   Has patient had a PCN reaction causing immediate rash, facial/tongue/throat swelling, SOB or lightheadedness with hypotension: Yes Has patient had a PCN reaction causing severe rash involving mucus membranes or skin necrosis: No Has patient had a PCN reaction that required hospitalization: No Has patient had a PCN reaction occurring within the last 10 years: No If all of the above answers are "NO", then may proceed with Cephalosporin use.   Valsartan Itching, Other (See Comments)   Heart sped up   Amlodipine Itching   Patient can take lower dose Norvasc         Medication List         Accurate as of November 15, 2022 10:26 AM. If you have any questions, ask your nurse or doctor.          amLODipine 2.5 MG tablet Commonly known as: NORVASC Take 2.5 mg by mouth daily.   aspirin EC 81 MG tablet Take 81 mg by mouth daily. Swallow whole.   calcium carbonate 1500 (600 Ca) MG Tabs tablet Commonly known as: OSCAL Take 600 mg of elemental calcium by mouth daily with breakfast.   carvedilol 3.125 MG tablet Commonly known as: COREG Take 3.125 mg by mouth 2 (two) times daily.   cyanocobalamin 1000 MCG tablet Commonly known as: VITAMIN B12 Take 500 mcg by mouth daily. 1/2 tablet   Estradiol 4 MCG Inst Imvexxy Maintenance Pack 4 mcg vaginal insert  Insert 1 vaginal insert twice a week by vaginal route.   furosemide 20 MG tablet Commonly known as: LASIX Take by mouth.   polyethylene glycol 17 g packet Commonly known as: MIRALAX / GLYCOLAX Take 17 g by mouth daily as needed.   pravastatin 10 MG tablet Commonly known as: PRAVACHOL pravastatin 10 mg tablet   Restasis 0.05 % ophthalmic emulsion Generic drug: cycloSPORINE INT 1 GTT IN OU BID UTD   SUMAtriptan 25 MG tablet Commonly known as: IMITREX Take 25 mg by mouth once as needed for migraine (MAY REPEAT ONCE IN 2 HOURS IF NO RELIEF (MAX 2 TABLETS/24 HOURS)).  Vitamin D (Ergocalciferol) 1.25 MG (50000 UNIT) Caps capsule Commonly known as: DRISDOL Take 50,000 Units by mouth every 7 (seven) days.        Allergies:  Allergies  Allergen Reactions   Penicillins Itching and Swelling    Has patient had a PCN reaction causing immediate rash, facial/tongue/throat swelling, SOB or lightheadedness with hypotension: Yes Has patient had a PCN reaction causing severe rash involving mucus membranes or skin necrosis: No Has patient had a PCN reaction that required hospitalization: No Has patient had a PCN reaction occurring within the last 10 years: No If all of the above answers are "NO", then may proceed with  Cephalosporin use.    Valsartan Itching and Other (See Comments)    Heart sped up   Amlodipine Itching    Patient can take lower dose Norvasc     Past Medical History, Surgical history, Social history, and Family History were reviewed and updated.  Review of Systems: All other 10 point review of systems is negative.   Physical Exam:  height is 5\' 1"  (1.549 m) and weight is 112 lb 12.8 oz (51.2 kg). Her oral temperature is 98.2 F (36.8 C). Her blood pressure is 142/57 (abnormal) and her pulse is 62. Her respiration is 19 and oxygen saturation is 100%.   Wt Readings from Last 3 Encounters:  11/15/22 112 lb 12.8 oz (51.2 kg)  09/06/22 108 lb 1.9 oz (49 kg)  07/24/22 107 lb (48.5 kg)    Ocular: Sclerae unicteric, pupils equal, round and reactive to light Ear-nose-throat: Oropharynx clear, dentition fair Lymphatic: No cervical or supraclavicular adenopathy Lungs no rales or rhonchi, good excursion bilaterally Heart regular rate and rhythm, no murmur appreciated. 1+ pitting edema bilaterally  Abd soft, nontender, positive bowel sounds MSK no focal spinal tenderness, no joint edema Neuro: non-focal, well-oriented, appropriate affect   Lab Results  Component Value Date   WBC 4.1 11/15/2022   HGB 11.3 (L) 11/15/2022   HCT 35.4 (L) 11/15/2022   MCV 100.6 (H) 11/15/2022   PLT 126 (L) 11/15/2022   Lab Results  Component Value Date   FERRITIN 336 (H) 09/06/2022   IRON 85 09/06/2022   TIBC 309 09/06/2022   UIBC 224 09/06/2022   IRONPCTSAT 28 09/06/2022   Lab Results  Component Value Date   RETICCTPCT 1.3 11/15/2022   RBC 3.53 (L) 11/15/2022   No results found for: "KPAFRELGTCHN", "LAMBDASER", "KAPLAMBRATIO" No results found for: "IGGSERUM", "IGA", "IGMSERUM" No results found for: "TOTALPROTELP", "ALBUMINELP", "A1GS", "A2GS", "BETS", "BETA2SER", "GAMS", "MSPIKE", "SPEI"   Chemistry      Component Value Date/Time   NA 140 10/24/2022 1331   NA 145 02/13/2017 1046   NA  142 12/23/2016 0951   K 4.2 10/24/2022 1331   K 4.6 02/13/2017 1046   K 3.9 12/23/2016 0951   CL 103 10/24/2022 1331   CL 108 02/13/2017 1046   CO2 30 10/24/2022 1331   CO2 28 02/13/2017 1046   CO2 25 12/23/2016 0951   BUN 20 10/24/2022 1331   BUN 22 02/13/2017 1046   BUN 15.0 12/23/2016 0951   CREATININE 1.19 (H) 10/24/2022 1331   CREATININE 1.1 02/13/2017 1046   CREATININE 0.9 12/23/2016 0951      Component Value Date/Time   CALCIUM 9.8 10/24/2022 1331   CALCIUM 9.7 02/13/2017 1046   CALCIUM 9.5 12/23/2016 0951   ALKPHOS 51 10/24/2022 1331   ALKPHOS 60 02/13/2017 1046   ALKPHOS 69 12/23/2016 0951   AST 19 10/24/2022 1331  AST 26 12/23/2016 0951   ALT 11 10/24/2022 1331   ALT 18 02/13/2017 1046   ALT 16 12/23/2016 0951   BILITOT 0.8 10/24/2022 1331   BILITOT 0.96 12/23/2016 0951       Impression and Plan: Rachael Davenport is a very pleasant 77 yo African American female with anemia of chronic renal insufficiency and erythropoietin deficiency.  ESA held today, Hgb 11.3  Iron studies are pending.    Lab and injection every 3 weeks.  Follow-up in 9 weeks.  Rushie Chestnut, PA-C 8/27/202410:26 AM

## 2022-12-06 ENCOUNTER — Inpatient Hospital Stay: Payer: Medicare Other

## 2022-12-06 ENCOUNTER — Inpatient Hospital Stay: Payer: Medicare Other | Attending: Family

## 2022-12-06 VITALS — BP 129/66 | HR 64 | Temp 98.0°F | Resp 18

## 2022-12-06 DIAGNOSIS — N189 Chronic kidney disease, unspecified: Secondary | ICD-10-CM | POA: Diagnosis present

## 2022-12-06 DIAGNOSIS — D631 Anemia in chronic kidney disease: Secondary | ICD-10-CM

## 2022-12-06 DIAGNOSIS — D500 Iron deficiency anemia secondary to blood loss (chronic): Secondary | ICD-10-CM

## 2022-12-06 LAB — CMP (CANCER CENTER ONLY)
ALT: 13 U/L (ref 0–44)
AST: 27 U/L (ref 15–41)
Albumin: 4.1 g/dL (ref 3.5–5.0)
Alkaline Phosphatase: 48 U/L (ref 38–126)
Anion gap: 7 (ref 5–15)
BUN: 28 mg/dL — ABNORMAL HIGH (ref 8–23)
CO2: 29 mmol/L (ref 22–32)
Calcium: 9.8 mg/dL (ref 8.9–10.3)
Chloride: 107 mmol/L (ref 98–111)
Creatinine: 1.13 mg/dL — ABNORMAL HIGH (ref 0.44–1.00)
GFR, Estimated: 50 mL/min — ABNORMAL LOW (ref >60)
Glucose, Bld: 112 mg/dL — ABNORMAL HIGH (ref 70–99)
Potassium: 4.6 mmol/L (ref 3.5–5.1)
Sodium: 143 mmol/L (ref 135–145)
Total Bilirubin: 1 mg/dL (ref 0.3–1.2)
Total Protein: 6.8 g/dL (ref 6.5–8.1)

## 2022-12-06 LAB — CBC WITH DIFFERENTIAL (CANCER CENTER ONLY)
Abs Immature Granulocytes: 0.01 10*3/uL (ref 0.00–0.07)
Basophils Absolute: 0 10*3/uL (ref 0.0–0.1)
Basophils Relative: 1 %
Eosinophils Absolute: 0.1 10*3/uL (ref 0.0–0.5)
Eosinophils Relative: 2 %
HCT: 33 % — ABNORMAL LOW (ref 36.0–46.0)
Hemoglobin: 10.9 g/dL — ABNORMAL LOW (ref 12.0–15.0)
Immature Granulocytes: 0 %
Lymphocytes Relative: 34 %
Lymphs Abs: 1.8 10*3/uL (ref 0.7–4.0)
MCH: 31.8 pg (ref 26.0–34.0)
MCHC: 33 g/dL (ref 30.0–36.0)
MCV: 96.2 fL (ref 80.0–100.0)
Monocytes Absolute: 0.6 10*3/uL (ref 0.1–1.0)
Monocytes Relative: 12 %
Neutro Abs: 2.7 10*3/uL (ref 1.7–7.7)
Neutrophils Relative %: 51 %
Platelet Count: 143 10*3/uL — ABNORMAL LOW (ref 150–400)
RBC: 3.43 MIL/uL — ABNORMAL LOW (ref 3.87–5.11)
RDW: 12.6 % (ref 11.5–15.5)
WBC Count: 5.2 10*3/uL (ref 4.0–10.5)
nRBC: 0 % (ref 0.0–0.2)

## 2022-12-06 MED ORDER — EPOETIN ALFA-EPBX 40000 UNIT/ML IJ SOLN
40000.0000 [IU] | Freq: Once | INTRAMUSCULAR | Status: AC
  Administered 2022-12-06: 40000 [IU] via SUBCUTANEOUS
  Filled 2022-12-06: qty 1

## 2022-12-06 NOTE — Patient Instructions (Signed)

## 2022-12-27 ENCOUNTER — Inpatient Hospital Stay: Payer: Medicare Other | Attending: Family

## 2022-12-27 DIAGNOSIS — N189 Chronic kidney disease, unspecified: Secondary | ICD-10-CM | POA: Insufficient documentation

## 2022-12-27 DIAGNOSIS — D631 Anemia in chronic kidney disease: Secondary | ICD-10-CM | POA: Diagnosis present

## 2022-12-27 LAB — CMP (CANCER CENTER ONLY)
ALT: 8 U/L (ref 0–44)
AST: 19 U/L (ref 15–41)
Albumin: 4.2 g/dL (ref 3.5–5.0)
Alkaline Phosphatase: 49 U/L (ref 38–126)
Anion gap: 7 (ref 5–15)
BUN: 21 mg/dL (ref 8–23)
CO2: 29 mmol/L (ref 22–32)
Calcium: 11.2 mg/dL — ABNORMAL HIGH (ref 8.9–10.3)
Chloride: 105 mmol/L (ref 98–111)
Creatinine: 1.18 mg/dL — ABNORMAL HIGH (ref 0.44–1.00)
GFR, Estimated: 47 mL/min — ABNORMAL LOW (ref >60)
Glucose, Bld: 96 mg/dL (ref 70–99)
Potassium: 5.5 mmol/L — ABNORMAL HIGH (ref 3.5–5.1)
Sodium: 141 mmol/L (ref 135–145)
Total Bilirubin: 0.9 mg/dL (ref 0.3–1.2)
Total Protein: 7.5 g/dL (ref 6.5–8.1)

## 2022-12-27 LAB — CBC WITH DIFFERENTIAL (CANCER CENTER ONLY)
Abs Immature Granulocytes: 0 10*3/uL (ref 0.00–0.07)
Basophils Absolute: 0.1 10*3/uL (ref 0.0–0.1)
Basophils Relative: 1 %
Eosinophils Absolute: 0.2 10*3/uL (ref 0.0–0.5)
Eosinophils Relative: 4 %
HCT: 37.5 % (ref 36.0–46.0)
Hemoglobin: 11.6 g/dL — ABNORMAL LOW (ref 12.0–15.0)
Immature Granulocytes: 0 %
Lymphocytes Relative: 40 %
Lymphs Abs: 1.9 10*3/uL (ref 0.7–4.0)
MCH: 31.2 pg (ref 26.0–34.0)
MCHC: 30.9 g/dL (ref 30.0–36.0)
MCV: 100.8 fL — ABNORMAL HIGH (ref 80.0–100.0)
Monocytes Absolute: 0.5 10*3/uL (ref 0.1–1.0)
Monocytes Relative: 10 %
Neutro Abs: 2 10*3/uL (ref 1.7–7.7)
Neutrophils Relative %: 45 %
Platelet Count: 141 10*3/uL — ABNORMAL LOW (ref 150–400)
RBC: 3.72 MIL/uL — ABNORMAL LOW (ref 3.87–5.11)
RDW: 13.2 % (ref 11.5–15.5)
WBC Count: 4.6 10*3/uL (ref 4.0–10.5)
nRBC: 0 % (ref 0.0–0.2)

## 2023-01-17 ENCOUNTER — Other Ambulatory Visit: Payer: Self-pay

## 2023-01-17 ENCOUNTER — Encounter: Payer: Self-pay | Admitting: Medical Oncology

## 2023-01-17 ENCOUNTER — Inpatient Hospital Stay: Payer: Medicare Other

## 2023-01-17 ENCOUNTER — Inpatient Hospital Stay: Payer: Medicare Other | Admitting: Medical Oncology

## 2023-01-17 VITALS — BP 123/83 | HR 57 | Temp 97.9°F | Resp 18 | Ht 61.0 in | Wt 112.0 lb

## 2023-01-17 DIAGNOSIS — N189 Chronic kidney disease, unspecified: Secondary | ICD-10-CM

## 2023-01-17 DIAGNOSIS — D500 Iron deficiency anemia secondary to blood loss (chronic): Secondary | ICD-10-CM | POA: Diagnosis not present

## 2023-01-17 DIAGNOSIS — D509 Iron deficiency anemia, unspecified: Secondary | ICD-10-CM | POA: Diagnosis not present

## 2023-01-17 DIAGNOSIS — D631 Anemia in chronic kidney disease: Secondary | ICD-10-CM

## 2023-01-17 LAB — CBC WITH DIFFERENTIAL (CANCER CENTER ONLY)
Abs Immature Granulocytes: 0.01 10*3/uL (ref 0.00–0.07)
Basophils Absolute: 0 10*3/uL (ref 0.0–0.1)
Basophils Relative: 1 %
Eosinophils Absolute: 0.1 10*3/uL (ref 0.0–0.5)
Eosinophils Relative: 3 %
HCT: 32.6 % — ABNORMAL LOW (ref 36.0–46.0)
Hemoglobin: 10.5 g/dL — ABNORMAL LOW (ref 12.0–15.0)
Immature Granulocytes: 0 %
Lymphocytes Relative: 36 %
Lymphs Abs: 1.7 10*3/uL (ref 0.7–4.0)
MCH: 31.8 pg (ref 26.0–34.0)
MCHC: 32.2 g/dL (ref 30.0–36.0)
MCV: 98.8 fL (ref 80.0–100.0)
Monocytes Absolute: 0.5 10*3/uL (ref 0.1–1.0)
Monocytes Relative: 11 %
Neutro Abs: 2.3 10*3/uL (ref 1.7–7.7)
Neutrophils Relative %: 49 %
Platelet Count: 121 10*3/uL — ABNORMAL LOW (ref 150–400)
RBC: 3.3 MIL/uL — ABNORMAL LOW (ref 3.87–5.11)
RDW: 12.5 % (ref 11.5–15.5)
WBC Count: 4.7 10*3/uL (ref 4.0–10.5)
nRBC: 0 % (ref 0.0–0.2)

## 2023-01-17 LAB — CMP (CANCER CENTER ONLY)
ALT: 9 U/L (ref 0–44)
AST: 18 U/L (ref 15–41)
Albumin: 4.4 g/dL (ref 3.5–5.0)
Alkaline Phosphatase: 45 U/L (ref 38–126)
Anion gap: 6 (ref 5–15)
BUN: 30 mg/dL — ABNORMAL HIGH (ref 8–23)
CO2: 29 mmol/L (ref 22–32)
Calcium: 9.2 mg/dL (ref 8.9–10.3)
Chloride: 107 mmol/L (ref 98–111)
Creatinine: 1.09 mg/dL — ABNORMAL HIGH (ref 0.44–1.00)
GFR, Estimated: 52 mL/min — ABNORMAL LOW (ref >60)
Glucose, Bld: 102 mg/dL — ABNORMAL HIGH (ref 70–99)
Potassium: 4.8 mmol/L (ref 3.5–5.1)
Sodium: 142 mmol/L (ref 135–145)
Total Bilirubin: 1 mg/dL (ref 0.3–1.2)
Total Protein: 7.2 g/dL (ref 6.5–8.1)

## 2023-01-17 MED ORDER — EPOETIN ALFA-EPBX 40000 UNIT/ML IJ SOLN
40000.0000 [IU] | Freq: Once | INTRAMUSCULAR | Status: AC
  Administered 2023-01-17: 40000 [IU] via SUBCUTANEOUS
  Filled 2023-01-17: qty 1

## 2023-01-17 NOTE — Patient Instructions (Signed)

## 2023-01-17 NOTE — Patient Instructions (Addendum)
A Proctologist may be able to help you with your hemorrhoids   Call your dietitian to schedule a follow up visit to see if they can help you with a kidney focuses diet  Since your labs have improved I want you to continue your vitamin D supplement but continue to hold your calcium supplement

## 2023-01-17 NOTE — Progress Notes (Signed)
Hematology and Oncology Follow Up Visit  Rachael Davenport [id number] 07/14/1945 77 y.o. 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    Interim History:  Rachael Davenport is here today for follow-up.   She states that she is doing well and has no concerns at this time.  She is recovering from a UTI and is currently on Ciprofloxacin- symptoms are improving She has been following a low potassium diet and low calcium diet since our last visit. She has stopped her calcium supplement but is still taking vitamin D. She has seen a dietitian but it has been over 1 year ago since their last visit.   No issues with blood loss. No bruising or petechiae.  No fever, chills, n/v, cough, rash, dizziness, chest pain, abdominal pain or changes in bowel or bladder habits.  No falls or syncope reported.  Mild hemorrhoidal bleeding. She recently had a colonoscopy since her last visit Appetite and hydration are good.  Wt Readings from Last 3 Encounters:  01/17/23 112 lb (50.8 kg)  11/15/22 112 lb 12.8 oz (51.2 kg)  09/06/22 108 lb 1.9 oz (49 kg)    ECOG Performance Status: 1 - Symptomatic but completely ambulatory  Medications:  Allergies as of 01/17/2023       Reactions   Penicillins Itching, Swelling   Has patient had a PCN reaction causing immediate rash, facial/tongue/throat swelling, SOB or lightheadedness with hypotension: Yes Has patient had a PCN reaction causing severe rash involving mucus membranes or skin necrosis: No Has patient had a PCN reaction that required hospitalization: No Has patient had a PCN reaction occurring within the last 10 years: No If all of the above answers are "NO", then may proceed with  Cephalosporin use.   Valsartan Itching, Other (See Comments)   Heart sped up   Amlodipine Itching   Patient can take lower dose Norvasc         Medication List        Accurate as of January 17, 2023 10:09 AM. If you have any questions, ask your nurse or doctor.          amLODipine 2.5 MG tablet Commonly known as: NORVASC Take 2.5 mg by mouth daily.   aspirin EC 81 MG tablet Take 81 mg by mouth daily. Swallow whole.   calcium carbonate 1500 (600 Ca) MG Tabs tablet Commonly known as: OSCAL Take 600 mg of elemental calcium by mouth daily with breakfast.   carvedilol 3.125 MG tablet Commonly known as: COREG Take 3.125 mg by mouth 2 (two) times daily.   ciprofloxacin 500 MG tablet Commonly known as: CIPRO Take 500 mg by mouth 2 (two) times daily.   cyanocobalamin 1000 MCG tablet Commonly known as: VITAMIN B12 Take 500 mcg by mouth daily. 1/2 tablet   Estradiol 4 MCG Inst Imvexxy Maintenance Pack 4 mcg vaginal insert  Insert 1 vaginal insert twice a week by vaginal route.   furosemide 20 MG tablet Commonly known as: LASIX Take by mouth.   polyethylene glycol 17 g packet Commonly known as: MIRALAX / GLYCOLAX Take 17 g by mouth daily as needed.   pravastatin 10 MG tablet Commonly known as: PRAVACHOL pravastatin 10 mg tablet   Restasis 0.05 %  ophthalmic emulsion Generic drug: cycloSPORINE INT 1 GTT IN OU BID UTD   SUMAtriptan 25 MG tablet Commonly known as: IMITREX Take 25 mg by mouth once as needed for migraine (MAY REPEAT ONCE IN 2 HOURS IF NO RELIEF (MAX 2 TABLETS/24 HOURS)).   Vitamin D (Ergocalciferol) 1.25 MG (50000 UNIT) Caps capsule Commonly known as: DRISDOL Take 50,000 Units by mouth every 7 (seven) days.        Allergies:  Allergies  Allergen Reactions   Penicillins Itching and Swelling    Has patient had a PCN reaction causing immediate rash, facial/tongue/throat swelling, SOB or lightheadedness with hypotension: Yes Has patient had a  PCN reaction causing severe rash involving mucus membranes or skin necrosis: No Has patient had a PCN reaction that required hospitalization: No Has patient had a PCN reaction occurring within the last 10 years: No If all of the above answers are "NO", then may proceed with Cephalosporin use.    Valsartan Itching and Other (See Comments)    Heart sped up   Amlodipine Itching    Patient can take lower dose Norvasc     Past Medical History, Surgical history, Social history, and Family History were reviewed and updated.  Review of Systems: All other 10 point review of systems is negative.   Physical Exam:  height is 5\' 1"  (1.549 m) and weight is 112 lb (50.8 kg). Her oral temperature is 97.9 F (36.6 C). Her blood pressure is 123/83 and her pulse is 57 (abnormal). Her respiration is 18 and oxygen saturation is 100%.   Wt Readings from Last 3 Encounters:  01/17/23 112 lb (50.8 kg)  11/15/22 112 lb 12.8 oz (51.2 kg)  09/06/22 108 lb 1.9 oz (49 kg)    Ocular: Sclerae unicteric, pupils equal, round and reactive to light Ear-nose-throat: Oropharynx clear, dentition fair Lymphatic: No cervical or supraclavicular adenopathy Lungs no rales or rhonchi, good excursion bilaterally Heart regular rate and rhythm, no murmur appreciated. 1+ pitting edema bilaterally  Abd soft, nontender, positive bowel sounds MSK no focal spinal tenderness, no joint edema Neuro: non-focal, well-oriented, appropriate affect   Lab Results  Component Value Date   WBC 4.7 01/17/2023   HGB 10.5 (L) 01/17/2023   HCT 32.6 (L) 01/17/2023   MCV 98.8 01/17/2023   PLT 121 (L) 01/17/2023   Lab Results  Component Value Date   FERRITIN 255 11/15/2022   IRON 97 11/15/2022   TIBC 304 11/15/2022   UIBC 207 11/15/2022   IRONPCTSAT 32 (H) 11/15/2022   Lab Results  Component Value Date   RETICCTPCT 1.3 11/15/2022   RBC 3.30 (L) 01/17/2023   No results found for: "KPAFRELGTCHN", "LAMBDASER", "KAPLAMBRATIO" No  results found for: "IGGSERUM", "IGA", "IGMSERUM" No results found for: "TOTALPROTELP", "ALBUMINELP", "A1GS", "A2GS", "BETS", "BETA2SER", "GAMS", "MSPIKE", "SPEI"   Chemistry      Component Value Date/Time   NA 142 01/17/2023 0914   NA 145 02/13/2017 1046   NA 142 12/23/2016 0951   K 4.8 01/17/2023 0914   K 4.6 02/13/2017 1046   K 3.9 12/23/2016 0951   CL 107 01/17/2023 0914   CL 108 02/13/2017 1046   CO2 29 01/17/2023 0914   CO2 28 02/13/2017 1046   CO2 25 12/23/2016 0951   BUN 30 (H) 01/17/2023 0914   BUN 22 02/13/2017 1046   BUN 15.0 12/23/2016 0951   CREATININE 1.09 (H) 01/17/2023 0914   CREATININE 1.1 02/13/2017 1046   CREATININE 0.9 12/23/2016 0951  Component Value Date/Time   CALCIUM 9.2 01/17/2023 0914   CALCIUM 9.7 02/13/2017 1046   CALCIUM 9.5 12/23/2016 0951   ALKPHOS 45 01/17/2023 0914   ALKPHOS 60 02/13/2017 1046   ALKPHOS 69 12/23/2016 0951   AST 18 01/17/2023 0914   AST 26 12/23/2016 0951   ALT 9 01/17/2023 0914   ALT 18 02/13/2017 1046   ALT 16 12/23/2016 0951   BILITOT 1.0 01/17/2023 0914   BILITOT 0.96 12/23/2016 0951     Encounter Diagnoses  Name Primary?   Erythropoietin deficiency anemia Yes   Iron deficiency anemia due to chronic blood loss    Anemia associated with chronic renal failure     Impression and Plan: Rachael Davenport is a very pleasant 77 yo African American female with anemia of chronic renal insufficiency and erythropoietin deficiency.  ESA today, Hgb 10.5 Iron studies are pending.    RTC every 3 weeks lab(CBC, BMP) and injection (Retacrit) RTC 9 weeks APP, labs (CBC, CMP, iron, ferritin)-[facility]  Rushie Chestnut, PA-C 10/29/202410:09 AM

## 2023-02-07 ENCOUNTER — Ambulatory Visit: Payer: Medicare Other

## 2023-02-08 ENCOUNTER — Inpatient Hospital Stay: Payer: Medicare Other

## 2023-02-08 ENCOUNTER — Inpatient Hospital Stay: Payer: Medicare Other | Attending: Family

## 2023-02-08 DIAGNOSIS — D631 Anemia in chronic kidney disease: Secondary | ICD-10-CM | POA: Insufficient documentation

## 2023-02-08 DIAGNOSIS — N189 Chronic kidney disease, unspecified: Secondary | ICD-10-CM | POA: Insufficient documentation

## 2023-02-08 LAB — CBC WITH DIFFERENTIAL (CANCER CENTER ONLY)
Abs Immature Granulocytes: 0 10*3/uL (ref 0.00–0.07)
Basophils Absolute: 0.1 10*3/uL (ref 0.0–0.1)
Basophils Relative: 1 %
Eosinophils Absolute: 0.2 10*3/uL (ref 0.0–0.5)
Eosinophils Relative: 4 %
HCT: 36.5 % (ref 36.0–46.0)
Hemoglobin: 11.8 g/dL — ABNORMAL LOW (ref 12.0–15.0)
Immature Granulocytes: 0 %
Lymphocytes Relative: 36 %
Lymphs Abs: 1.2 10*3/uL (ref 0.7–4.0)
MCH: 31.8 pg (ref 26.0–34.0)
MCHC: 32.3 g/dL (ref 30.0–36.0)
MCV: 98.4 fL (ref 80.0–100.0)
Monocytes Absolute: 0.5 10*3/uL (ref 0.1–1.0)
Monocytes Relative: 14 %
Neutro Abs: 1.6 10*3/uL — ABNORMAL LOW (ref 1.7–7.7)
Neutrophils Relative %: 45 %
Platelet Count: 133 10*3/uL — ABNORMAL LOW (ref 150–400)
RBC: 3.71 MIL/uL — ABNORMAL LOW (ref 3.87–5.11)
RDW: 12.7 % (ref 11.5–15.5)
WBC Count: 3.5 10*3/uL — ABNORMAL LOW (ref 4.0–10.5)
nRBC: 0 % (ref 0.0–0.2)

## 2023-02-08 LAB — CMP (CANCER CENTER ONLY)
ALT: 9 U/L (ref 0–44)
AST: 19 U/L (ref 15–41)
Albumin: 4.4 g/dL (ref 3.5–5.0)
Alkaline Phosphatase: 50 U/L (ref 38–126)
Anion gap: 6 (ref 5–15)
BUN: 18 mg/dL (ref 8–23)
CO2: 31 mmol/L (ref 22–32)
Calcium: 10.6 mg/dL — ABNORMAL HIGH (ref 8.9–10.3)
Chloride: 105 mmol/L (ref 98–111)
Creatinine: 0.93 mg/dL (ref 0.44–1.00)
GFR, Estimated: >60 mL/min (ref >60)
Glucose, Bld: 105 mg/dL — ABNORMAL HIGH (ref 70–99)
Potassium: 4.7 mmol/L (ref 3.5–5.1)
Sodium: 142 mmol/L (ref 135–145)
Total Bilirubin: 1.3 mg/dL — ABNORMAL HIGH (ref <1.2)
Total Protein: 7.5 g/dL (ref 6.5–8.1)

## 2023-03-01 ENCOUNTER — Inpatient Hospital Stay: Payer: Medicare Other

## 2023-03-01 ENCOUNTER — Inpatient Hospital Stay: Payer: Medicare Other | Attending: Family

## 2023-03-01 VITALS — BP 130/57 | HR 63 | Temp 97.7°F

## 2023-03-01 DIAGNOSIS — D631 Anemia in chronic kidney disease: Secondary | ICD-10-CM | POA: Insufficient documentation

## 2023-03-01 DIAGNOSIS — N189 Chronic kidney disease, unspecified: Secondary | ICD-10-CM | POA: Insufficient documentation

## 2023-03-01 DIAGNOSIS — D500 Iron deficiency anemia secondary to blood loss (chronic): Secondary | ICD-10-CM

## 2023-03-01 LAB — CMP (CANCER CENTER ONLY)
ALT: 9 U/L (ref 0–44)
AST: 20 U/L (ref 15–41)
Albumin: 4.1 g/dL (ref 3.5–5.0)
Alkaline Phosphatase: 55 U/L (ref 38–126)
Anion gap: 6 (ref 5–15)
BUN: 25 mg/dL — ABNORMAL HIGH (ref 8–23)
CO2: 29 mmol/L (ref 22–32)
Calcium: 10.1 mg/dL (ref 8.9–10.3)
Chloride: 106 mmol/L (ref 98–111)
Creatinine: 1.01 mg/dL — ABNORMAL HIGH (ref 0.44–1.00)
GFR, Estimated: 57 mL/min — ABNORMAL LOW (ref >60)
Glucose, Bld: 89 mg/dL (ref 70–99)
Potassium: 4.7 mmol/L (ref 3.5–5.1)
Sodium: 141 mmol/L (ref 135–145)
Total Bilirubin: 1 mg/dL (ref <1.2)
Total Protein: 7.4 g/dL (ref 6.5–8.1)

## 2023-03-01 LAB — CBC WITH DIFFERENTIAL (CANCER CENTER ONLY)
Abs Immature Granulocytes: 0.02 10*3/uL (ref 0.00–0.07)
Basophils Absolute: 0 10*3/uL (ref 0.0–0.1)
Basophils Relative: 1 %
Eosinophils Absolute: 0.3 10*3/uL (ref 0.0–0.5)
Eosinophils Relative: 6 %
HCT: 32.6 % — ABNORMAL LOW (ref 36.0–46.0)
Hemoglobin: 10.6 g/dL — ABNORMAL LOW (ref 12.0–15.0)
Immature Granulocytes: 0 %
Lymphocytes Relative: 35 %
Lymphs Abs: 1.7 10*3/uL (ref 0.7–4.0)
MCH: 32.2 pg (ref 26.0–34.0)
MCHC: 32.5 g/dL (ref 30.0–36.0)
MCV: 99.1 fL (ref 80.0–100.0)
Monocytes Absolute: 0.6 10*3/uL (ref 0.1–1.0)
Monocytes Relative: 12 %
Neutro Abs: 2.1 10*3/uL (ref 1.7–7.7)
Neutrophils Relative %: 46 %
Platelet Count: 138 10*3/uL — ABNORMAL LOW (ref 150–400)
RBC: 3.29 MIL/uL — ABNORMAL LOW (ref 3.87–5.11)
RDW: 12.6 % (ref 11.5–15.5)
WBC Count: 4.7 10*3/uL (ref 4.0–10.5)
nRBC: 0 % (ref 0.0–0.2)

## 2023-03-01 MED ORDER — EPOETIN ALFA-EPBX 40000 UNIT/ML IJ SOLN
40000.0000 [IU] | Freq: Once | INTRAMUSCULAR | Status: AC
  Administered 2023-03-01: 40000 [IU] via SUBCUTANEOUS
  Filled 2023-03-01: qty 1

## 2023-03-01 NOTE — Patient Instructions (Signed)

## 2023-03-17 ENCOUNTER — Ambulatory Visit
Admission: RE | Admit: 2023-03-17 | Discharge: 2023-03-17 | Disposition: A | Discharge status: Home / Self Care | Payer: Medicare Other | Source: Ambulatory Visit | Attending: Internal Medicine | Admitting: Internal Medicine

## 2023-03-17 DIAGNOSIS — Z0000 Encounter for general adult medical examination without abnormal findings: Secondary | ICD-10-CM

## 2023-03-29 ENCOUNTER — Inpatient Hospital Stay: Payer: Medicare Other | Admitting: Medical Oncology

## 2023-03-29 ENCOUNTER — Inpatient Hospital Stay: Payer: Medicare Other

## 2023-03-30 ENCOUNTER — Inpatient Hospital Stay: Payer: Medicare Other

## 2023-03-30 ENCOUNTER — Inpatient Hospital Stay: Payer: Medicare Other | Admitting: Medical Oncology

## 2023-03-30 ENCOUNTER — Inpatient Hospital Stay: Payer: Medicare Other | Attending: Family

## 2023-03-30 ENCOUNTER — Encounter: Payer: Self-pay | Admitting: Medical Oncology

## 2023-03-30 VITALS — BP 119/56 | HR 82 | Temp 99.7°F | Resp 18 | Ht 61.0 in | Wt 109.8 lb

## 2023-03-30 DIAGNOSIS — I129 Hypertensive chronic kidney disease with stage 1 through stage 4 chronic kidney disease, or unspecified chronic kidney disease: Secondary | ICD-10-CM | POA: Insufficient documentation

## 2023-03-30 DIAGNOSIS — N189 Chronic kidney disease, unspecified: Secondary | ICD-10-CM

## 2023-03-30 DIAGNOSIS — E639 Nutritional deficiency, unspecified: Secondary | ICD-10-CM

## 2023-03-30 DIAGNOSIS — K648 Other hemorrhoids: Secondary | ICD-10-CM | POA: Insufficient documentation

## 2023-03-30 DIAGNOSIS — Z79899 Other long term (current) drug therapy: Secondary | ICD-10-CM | POA: Diagnosis not present

## 2023-03-30 DIAGNOSIS — D631 Anemia in chronic kidney disease: Secondary | ICD-10-CM

## 2023-03-30 DIAGNOSIS — D500 Iron deficiency anemia secondary to blood loss (chronic): Secondary | ICD-10-CM | POA: Insufficient documentation

## 2023-03-30 LAB — CBC WITH DIFFERENTIAL (CANCER CENTER ONLY)
Abs Immature Granulocytes: 0.04 10*3/uL (ref 0.00–0.07)
Basophils Absolute: 0.1 10*3/uL (ref 0.0–0.1)
Basophils Relative: 1 %
Eosinophils Absolute: 0.1 10*3/uL (ref 0.0–0.5)
Eosinophils Relative: 1 %
HCT: 35.6 % — ABNORMAL LOW (ref 36.0–46.0)
Hemoglobin: 11.5 g/dL — ABNORMAL LOW (ref 12.0–15.0)
Immature Granulocytes: 0 %
Lymphocytes Relative: 13 %
Lymphs Abs: 1.2 10*3/uL (ref 0.7–4.0)
MCH: 31.9 pg (ref 26.0–34.0)
MCHC: 32.3 g/dL (ref 30.0–36.0)
MCV: 98.9 fL (ref 80.0–100.0)
Monocytes Absolute: 1 10*3/uL (ref 0.1–1.0)
Monocytes Relative: 11 %
Neutro Abs: 7 10*3/uL (ref 1.7–7.7)
Neutrophils Relative %: 74 %
Platelet Count: 154 10*3/uL (ref 150–400)
RBC: 3.6 MIL/uL — ABNORMAL LOW (ref 3.87–5.11)
RDW: 12.7 % (ref 11.5–15.5)
WBC Count: 9.4 10*3/uL (ref 4.0–10.5)
nRBC: 0 % (ref 0.0–0.2)

## 2023-03-30 LAB — CMP (CANCER CENTER ONLY)
ALT: 9 U/L (ref 0–44)
AST: 17 U/L (ref 15–41)
Albumin: 4.2 g/dL (ref 3.5–5.0)
Alkaline Phosphatase: 51 U/L (ref 38–126)
Anion gap: 7 (ref 5–15)
BUN: 18 mg/dL (ref 8–23)
CO2: 29 mmol/L (ref 22–32)
Calcium: 9.8 mg/dL (ref 8.9–10.3)
Chloride: 102 mmol/L (ref 98–111)
Creatinine: 1.13 mg/dL — ABNORMAL HIGH (ref 0.44–1.00)
GFR, Estimated: 50 mL/min — ABNORMAL LOW (ref >60)
Glucose, Bld: 169 mg/dL — ABNORMAL HIGH (ref 70–99)
Potassium: 4.6 mmol/L (ref 3.5–5.1)
Sodium: 138 mmol/L (ref 135–145)
Total Bilirubin: 1.3 mg/dL — ABNORMAL HIGH (ref 0.0–1.2)
Total Protein: 7.3 g/dL (ref 6.5–8.1)

## 2023-03-30 NOTE — Progress Notes (Signed)
 Hematology and Oncology Follow Up Visit  Rachael Davenport [id number] [birth year] 78 y.o. 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    Interim History:  Rachael Davenport is here today for follow-up.   Today she states that she is doing well.  She has been following a low potassium diet and low calcium diet since our last visit. She has stopped her calcium supplement but is still taking vitamin D.  No issues with blood loss. No bruising or petechiae.  No fever, chills, n/v, cough, rash, dizziness, chest pain, abdominal pain or changes in bowel or bladder habits.  No falls or syncope reported.  Mild hemorrhoidal bleeding. She recently had a colonoscopy since her last visit Appetite and hydration are good to fair. She wants to have a follow up with her nutritionist to maximize her health.  Wt Readings from Last 3 Encounters:  03/30/23 109 lb 12.8 oz (49.8 kg)  01/17/23 112 lb (50.8 kg)  11/15/22 112 lb 12.8 oz (51.2 kg)    ECOG Performance Status: 1 - Symptomatic but completely ambulatory  Medications:  Allergies as of 03/30/2023       Reactions   Penicillins Hives, Itching, Swelling   Has patient had a PCN reaction causing immediate rash, facial/tongue/throat swelling, SOB or lightheadedness with hypotension: Yes Has patient had a PCN reaction causing severe rash involving mucus membranes or skin necrosis: No Has patient had a PCN reaction that required hospitalization: No Has patient had a PCN reaction occurring within the last 10 years: No If all of the above answers are NO, then may proceed with Cephalosporin use.   Valsartan Itching, Other (See Comments)   Heart  sped up   Amlodipine Itching   Patient can take lower dose Norvasc         Medication List        Accurate as of March 30, 2023 11:57 AM. If you have any questions, ask your nurse or doctor.          STOP taking these medications    calcium carbonate 1500 (600 Ca) MG Tabs tablet Commonly known as: OSCAL Stopped by: Lauraine CHRISTELLA Dais   ciprofloxacin 500 MG tablet Commonly known as: CIPRO Stopped by: Lauraine CHRISTELLA Dais       TAKE these medications    amLODipine 2.5 MG tablet Commonly known as: NORVASC Take 2.5 mg by mouth daily.   aspirin EC 81 MG tablet Take 81 mg by mouth daily. Swallow whole.   carvedilol 12.5 MG tablet Commonly known as: COREG Take 12.5 mg by mouth 2 (two) times daily with a meal. What changed: Another medication with the same name was removed. Continue taking this medication, and follow the directions you see here. Changed by: Lauraine CHRISTELLA Dais   cyanocobalamin  1000 MCG tablet Commonly known as: VITAMIN B12 Take 1,000 mcg by mouth daily. What changed: Another medication with the same name was removed. Continue taking this medication, and follow the directions you see here. Changed by: Armonie Mettler M Darden Flemister   Estradiol 4 MCG Inst Imvexxy Maintenance Pack 4 mcg vaginal insert  Insert 1 vaginal insert twice a week by vaginal route.   furosemide  20 MG  tablet Commonly known as: LASIX  Take by mouth.   polyethylene glycol 17 g packet Commonly known as: MIRALAX / GLYCOLAX Take 17 g by mouth daily as needed.   pravastatin 10 MG tablet Commonly known as: PRAVACHOL pravastatin 10 mg tablet   Restasis 0.05 % ophthalmic emulsion Generic drug: cycloSPORINE INT 1 GTT IN OU BID UTD   SUMAtriptan 25 MG tablet Commonly known as: IMITREX Take 25 mg by mouth once as needed for migraine (MAY REPEAT ONCE IN 2 HOURS IF NO RELIEF (MAX 2 TABLETS/24 HOURS)).   Vitamin D (Ergocalciferol) 1.25 MG (50000 UNIT) Caps capsule Commonly known as:  DRISDOL Take 50,000 Units by mouth every 7 (seven) days.        Allergies:  Allergies  Allergen Reactions   Penicillins Hives, Itching and Swelling    Has patient had a PCN reaction causing immediate rash, facial/tongue/throat swelling, SOB or lightheadedness with hypotension: Yes Has patient had a PCN reaction causing severe rash involving mucus membranes or skin necrosis: No Has patient had a PCN reaction that required hospitalization: No Has patient had a PCN reaction occurring within the last 10 years: No If all of the above answers are NO, then may proceed with Cephalosporin use.    Valsartan Itching and Other (See Comments)    Heart sped up   Amlodipine Itching    Patient can take lower dose Norvasc     Past Medical History, Surgical history, Social history, and Family History were reviewed and updated.  Review of Systems: All other 10 point review of systems is negative.   Physical Exam:  height is 5' 1 (1.549 m) and weight is 109 lb 12.8 oz (49.8 kg). Her oral temperature is 99.7 F (37.6 C). Her blood pressure is 119/56 (abnormal) and her pulse is 82. Her respiration is 18 and oxygen saturation is 100%.   Wt Readings from Last 3 Encounters:  03/30/23 109 lb 12.8 oz (49.8 kg)  01/17/23 112 lb (50.8 kg)  11/15/22 112 lb 12.8 oz (51.2 kg)    Ocular: Sclerae unicteric, pupils equal, round and reactive to light Ear-nose-throat: Oropharynx clear, dentition fair Lymphatic: No cervical or supraclavicular adenopathy Lungs no rales or rhonchi, good excursion bilaterally Heart regular rate and rhythm, no murmur appreciated. 1+ pitting edema bilaterally  Abd soft, nontender, positive bowel sounds MSK no focal spinal tenderness, no joint edema Neuro: non-focal, well-oriented, appropriate affect   Lab Results  Component Value Date   WBC 9.4 03/30/2023   HGB 11.5 (L) 03/30/2023   HCT 35.6 (L) 03/30/2023   MCV 98.9 03/30/2023   PLT 154 03/30/2023   Lab Results   Component Value Date   FERRITIN 255 11/15/2022   IRON  97 11/15/2022   TIBC 304 11/15/2022   UIBC 207 11/15/2022   IRONPCTSAT 32 (H) 11/15/2022   Lab Results  Component Value Date   RETICCTPCT 1.3 11/15/2022   RBC 3.60 (L) 03/30/2023   No results found for: KPAFRELGTCHN, LAMBDASER, KAPLAMBRATIO No results found for: IGGSERUM, IGA, IGMSERUM No results found for: STEPHANY CARLOTA BENSON MARKEL EARLA JOANNIE DOC VICK, SPEI   Chemistry      Component Value Date/Time   NA 138 03/30/2023 1105   NA 145 02/13/2017 1046   NA 142 12/23/2016 0951   K 4.6 03/30/2023 1105   K 4.6 02/13/2017 1046   K 3.9 12/23/2016 0951   CL 102 03/30/2023 1105   CL 108 02/13/2017 1046   CO2 29 03/30/2023 1105   CO2 28 02/13/2017  1046   CO2 25 12/23/2016 0951   BUN 18 03/30/2023 1105   BUN 22 02/13/2017 1046   BUN 15.0 12/23/2016 0951   CREATININE 1.13 (H) 03/30/2023 1105   CREATININE 1.1 02/13/2017 1046   CREATININE 0.9 12/23/2016 0951      Component Value Date/Time   CALCIUM 9.8 03/30/2023 1105   CALCIUM 9.7 02/13/2017 1046   CALCIUM 9.5 12/23/2016 0951   ALKPHOS 51 03/30/2023 1105   ALKPHOS 60 02/13/2017 1046   ALKPHOS 69 12/23/2016 0951   AST 17 03/30/2023 1105   AST 26 12/23/2016 0951   ALT 9 03/30/2023 1105   ALT 18 02/13/2017 1046   ALT 16 12/23/2016 0951   BILITOT 1.3 (H) 03/30/2023 1105   BILITOT 0.96 12/23/2016 0951     Encounter Diagnoses  Name Primary?   Erythropoietin  deficiency anemia Yes   Iron  deficiency anemia due to chronic blood loss    Anemia associated with chronic renal failure    Impression and Plan: Ms. Abate is a very pleasant 78 yo African American female with anemia of chronic renal insufficiency and erythropoietin  deficiency.  Glucose is non-fasting. She will see her nutritionist and try to reduce her carbohydrate use.  ESA not needed today, Hgb 11.5 Iron  studies are pending.  Will replace if needed  RTC every  3 weeks lab(CBC, BMP) and injection (Retacrit ) RTC 9 weeks APP, labs (CBC, CMP, iron , ferritin, B12, folate)-[place]  Lauraine CHRISTELLA Dais, PA-C 1/9/202511:57 AM

## 2023-04-20 ENCOUNTER — Inpatient Hospital Stay: Payer: Medicare Other

## 2023-04-20 DIAGNOSIS — I129 Hypertensive chronic kidney disease with stage 1 through stage 4 chronic kidney disease, or unspecified chronic kidney disease: Secondary | ICD-10-CM | POA: Diagnosis not present

## 2023-04-20 DIAGNOSIS — D631 Anemia in chronic kidney disease: Secondary | ICD-10-CM

## 2023-04-20 LAB — CMP (CANCER CENTER ONLY)
ALT: 10 U/L (ref 0–44)
AST: 18 U/L (ref 15–41)
Albumin: 4.5 g/dL (ref 3.5–5.0)
Alkaline Phosphatase: 45 U/L (ref 38–126)
Anion gap: 8 (ref 5–15)
BUN: 19 mg/dL (ref 8–23)
CO2: 28 mmol/L (ref 22–32)
Calcium: 10.1 mg/dL (ref 8.9–10.3)
Chloride: 106 mmol/L (ref 98–111)
Creatinine: 1.04 mg/dL — ABNORMAL HIGH (ref 0.44–1.00)
GFR, Estimated: 55 mL/min — ABNORMAL LOW (ref >60)
Glucose, Bld: 102 mg/dL — ABNORMAL HIGH (ref 70–99)
Potassium: 4.8 mmol/L (ref 3.5–5.1)
Sodium: 142 mmol/L (ref 135–145)
Total Bilirubin: 0.9 mg/dL (ref 0.0–1.2)
Total Protein: 7.8 g/dL (ref 6.5–8.1)

## 2023-04-20 LAB — CBC WITH DIFFERENTIAL (CANCER CENTER ONLY)
Abs Immature Granulocytes: 0 10*3/uL (ref 0.00–0.07)
Basophils Absolute: 0.1 10*3/uL (ref 0.0–0.1)
Basophils Relative: 1 %
Eosinophils Absolute: 0.2 10*3/uL (ref 0.0–0.5)
Eosinophils Relative: 4 %
HCT: 34.6 % — ABNORMAL LOW (ref 36.0–46.0)
Hemoglobin: 11.2 g/dL — ABNORMAL LOW (ref 12.0–15.0)
Immature Granulocytes: 0 %
Lymphocytes Relative: 39 %
Lymphs Abs: 1.6 10*3/uL (ref 0.7–4.0)
MCH: 31.9 pg (ref 26.0–34.0)
MCHC: 32.4 g/dL (ref 30.0–36.0)
MCV: 98.6 fL (ref 80.0–100.0)
Monocytes Absolute: 0.6 10*3/uL (ref 0.1–1.0)
Monocytes Relative: 13 %
Neutro Abs: 1.8 10*3/uL (ref 1.7–7.7)
Neutrophils Relative %: 43 %
Platelet Count: 156 10*3/uL (ref 150–400)
RBC: 3.51 MIL/uL — ABNORMAL LOW (ref 3.87–5.11)
RDW: 12.8 % (ref 11.5–15.5)
WBC Count: 4.2 10*3/uL (ref 4.0–10.5)
nRBC: 0 % (ref 0.0–0.2)

## 2023-04-20 NOTE — Progress Notes (Signed)
Hgb. Today is 11.2 so no injection needed.  Patient has return appts. And voiced understanding.

## 2023-05-11 ENCOUNTER — Inpatient Hospital Stay: Payer: Medicare Other | Attending: Family

## 2023-05-11 ENCOUNTER — Inpatient Hospital Stay: Payer: Medicare Other

## 2023-05-31 ENCOUNTER — Encounter: Payer: Self-pay | Admitting: Medical Oncology

## 2023-05-31 ENCOUNTER — Inpatient Hospital Stay: Payer: Medicare Other | Admitting: Medical Oncology

## 2023-05-31 ENCOUNTER — Inpatient Hospital Stay: Payer: Medicare Other

## 2023-05-31 ENCOUNTER — Inpatient Hospital Stay: Payer: Medicare Other | Attending: Family

## 2023-05-31 VITALS — BP 136/47 | HR 64 | Temp 98.3°F | Resp 18 | Ht 61.0 in | Wt 107.0 lb

## 2023-05-31 DIAGNOSIS — E639 Nutritional deficiency, unspecified: Secondary | ICD-10-CM | POA: Diagnosis not present

## 2023-05-31 DIAGNOSIS — N390 Urinary tract infection, site not specified: Secondary | ICD-10-CM | POA: Diagnosis not present

## 2023-05-31 DIAGNOSIS — K648 Other hemorrhoids: Secondary | ICD-10-CM | POA: Diagnosis not present

## 2023-05-31 DIAGNOSIS — N189 Chronic kidney disease, unspecified: Secondary | ICD-10-CM

## 2023-05-31 DIAGNOSIS — D500 Iron deficiency anemia secondary to blood loss (chronic): Secondary | ICD-10-CM

## 2023-05-31 DIAGNOSIS — D631 Anemia in chronic kidney disease: Secondary | ICD-10-CM

## 2023-05-31 DIAGNOSIS — D509 Iron deficiency anemia, unspecified: Secondary | ICD-10-CM | POA: Diagnosis not present

## 2023-05-31 DIAGNOSIS — N184 Chronic kidney disease, stage 4 (severe): Secondary | ICD-10-CM | POA: Insufficient documentation

## 2023-05-31 DIAGNOSIS — I129 Hypertensive chronic kidney disease with stage 1 through stage 4 chronic kidney disease, or unspecified chronic kidney disease: Secondary | ICD-10-CM | POA: Insufficient documentation

## 2023-05-31 DIAGNOSIS — Z79899 Other long term (current) drug therapy: Secondary | ICD-10-CM | POA: Diagnosis not present

## 2023-05-31 LAB — CBC WITH DIFFERENTIAL (CANCER CENTER ONLY)
Abs Immature Granulocytes: 0.01 10*3/uL (ref 0.00–0.07)
Basophils Absolute: 0 10*3/uL (ref 0.0–0.1)
Basophils Relative: 1 %
Eosinophils Absolute: 0.1 10*3/uL (ref 0.0–0.5)
Eosinophils Relative: 3 %
HCT: 31.6 % — ABNORMAL LOW (ref 36.0–46.0)
Hemoglobin: 10.3 g/dL — ABNORMAL LOW (ref 12.0–15.0)
Immature Granulocytes: 0 %
Lymphocytes Relative: 40 %
Lymphs Abs: 1.4 10*3/uL (ref 0.7–4.0)
MCH: 32.2 pg (ref 26.0–34.0)
MCHC: 32.6 g/dL (ref 30.0–36.0)
MCV: 98.8 fL (ref 80.0–100.0)
Monocytes Absolute: 0.3 10*3/uL (ref 0.1–1.0)
Monocytes Relative: 9 %
Neutro Abs: 1.6 10*3/uL — ABNORMAL LOW (ref 1.7–7.7)
Neutrophils Relative %: 47 %
Platelet Count: 151 10*3/uL (ref 150–400)
RBC: 3.2 MIL/uL — ABNORMAL LOW (ref 3.87–5.11)
RDW: 12.9 % (ref 11.5–15.5)
WBC Count: 3.5 10*3/uL — ABNORMAL LOW (ref 4.0–10.5)
nRBC: 0 % (ref 0.0–0.2)

## 2023-05-31 LAB — CMP (CANCER CENTER ONLY)
ALT: 9 U/L (ref 0–44)
AST: 17 U/L (ref 15–41)
Albumin: 4.4 g/dL (ref 3.5–5.0)
Alkaline Phosphatase: 49 U/L (ref 38–126)
Anion gap: 7 (ref 5–15)
BUN: 12 mg/dL (ref 8–23)
CO2: 28 mmol/L (ref 22–32)
Calcium: 9.7 mg/dL (ref 8.9–10.3)
Chloride: 105 mmol/L (ref 98–111)
Creatinine: 0.86 mg/dL (ref 0.44–1.00)
GFR, Estimated: >60 mL/min (ref >60)
Glucose, Bld: 92 mg/dL (ref 70–99)
Potassium: 3.8 mmol/L (ref 3.5–5.1)
Sodium: 140 mmol/L (ref 135–145)
Total Bilirubin: 1.1 mg/dL (ref 0.0–1.2)
Total Protein: 7.4 g/dL (ref 6.5–8.1)

## 2023-05-31 LAB — IRON AND IRON BINDING CAPACITY (CC-WL,HP ONLY)
Iron: 97 ug/dL (ref 28–170)
Saturation Ratios: 33 % — ABNORMAL HIGH (ref 10.4–31.8)
TIBC: 291 ug/dL (ref 250–450)
UIBC: 194 ug/dL (ref 148–442)

## 2023-05-31 LAB — FERRITIN: Ferritin: 311 ng/mL — ABNORMAL HIGH (ref 11–307)

## 2023-05-31 LAB — FOLATE: Folate: 19.5 ng/mL (ref >5.9)

## 2023-05-31 LAB — VITAMIN B12: Vitamin B-12: 5205 pg/mL — ABNORMAL HIGH (ref 180–914)

## 2023-05-31 MED ORDER — EPOETIN ALFA-EPBX 40000 UNIT/ML IJ SOLN
40000.0000 [IU] | Freq: Once | INTRAMUSCULAR | Status: AC
  Administered 2023-05-31: 40000 [IU] via SUBCUTANEOUS
  Filled 2023-05-31: qty 1

## 2023-05-31 NOTE — Progress Notes (Signed)
 Hematology and Oncology Follow Up Visit  Rachael Davenport [id number] Jul 05, 1945 78 y.o. 05/31/2023   Principle Diagnosis:  Anemia secondary to erythropoietin deficiency Chronic renal insufficiency  Intermittent iron deficiency anemia    Current Therapy:        Retacrit 40,000 units SQ for Hgb < 11 IV iron as indicated - last dose IV Venofer 200 mg on 02/19/2019  Previous work up: Colonoscopy- Sept 2024- internal hemorrhoids but otherwise.    Interim History:  Rachael Davenport is here today for follow-up.   Today she states that she is doing well.  She has been been reducing her carbohydrates in an effort to improve her fasting glucose values. She is also mid treatment for a UTI with Keflex. Feeling better.   No issues with blood loss. No bruising or petechiae.  No fever, chills, n/v, cough, rash, dizziness, chest pain, abdominal pain or changes in bowel or bladder habits.  No falls or syncope reported.  Mild hemorrhoidal bleeding. UTD on colonoscopy -2025 Appetite and hydration are good  Wt Readings from Last 3 Encounters:  05/31/23 107 lb (48.5 kg)  03/30/23 109 lb 12.8 oz (49.8 kg)  01/17/23 112 lb (50.8 kg)    ECOG Performance Status: 1 - Symptomatic but completely ambulatory  Medications:  Allergies as of 05/31/2023       Reactions   Penicillins Hives, Itching, Swelling   Has patient had a PCN reaction causing immediate rash, facial/tongue/throat swelling, SOB or lightheadedness with hypotension: Yes Has patient had a PCN reaction causing severe rash involving mucus membranes or skin necrosis: No Has patient had a PCN reaction that required hospitalization: No Has patient had a PCN reaction occurring within the last 10 years: No If all of the above answers are "NO", then may proceed with Cephalosporin use.   Valsartan Itching, Other (See Comments)   Heart sped up   Amlodipine Itching   Patient can take lower dose Norvasc         Medication List         Accurate as of May 31, 2023 11:07 AM. If you have any questions, ask your nurse or doctor.          amLODipine 2.5 MG tablet Commonly known as: NORVASC Take 2.5 mg by mouth daily.   aspirin EC 81 MG tablet Take 81 mg by mouth daily. Swallow whole.   carvedilol 12.5 MG tablet Commonly known as: COREG Take 12.5 mg by mouth 2 (two) times daily with a meal.   cephALEXin 500 MG capsule Commonly known as: KEFLEX Take 500 mg by mouth 2 (two) times daily.   cyanocobalamin 1000 MCG tablet Commonly known as: VITAMIN B12 Take 1,000 mcg by mouth daily.   Estradiol 4 MCG Inst Imvexxy Maintenance Pack 4 mcg vaginal insert  Insert 1 vaginal insert twice a week by vaginal route.   furosemide 20 MG tablet Commonly known as: LASIX Take by mouth.   polyethylene glycol 17 g packet Commonly known as: MIRALAX / GLYCOLAX Take 17 g by mouth daily as needed.   pravastatin 10 MG tablet Commonly known as: PRAVACHOL pravastatin 10 mg tablet   Restasis 0.05 % ophthalmic emulsion Generic drug: cycloSPORINE INT 1 GTT IN OU BID UTD   SUMAtriptan 25 MG tablet Commonly known as: IMITREX Take 25 mg by mouth once as needed for migraine (MAY REPEAT ONCE IN 2 HOURS IF NO RELIEF (MAX 2 TABLETS/24 HOURS)).   Vitamin D (Ergocalciferol) 1.25 MG (50000 UNIT) Caps capsule Commonly known  as: DRISDOL Take 50,000 Units by mouth every 7 (seven) days.        Allergies:  Allergies  Allergen Reactions   Penicillins Hives, Itching and Swelling    Has patient had a PCN reaction causing immediate rash, facial/tongue/throat swelling, SOB or lightheadedness with hypotension: Yes Has patient had a PCN reaction causing severe rash involving mucus membranes or skin necrosis: No Has patient had a PCN reaction that required hospitalization: No Has patient had a PCN reaction occurring within the last 10 years: No If all of the above answers are "NO", then may proceed with Cephalosporin use.    Valsartan  Itching and Other (See Comments)    Heart sped up   Amlodipine Itching    Patient can take lower dose Norvasc     Past Medical History, Surgical history, Social history, and Family History were reviewed and updated.  Review of Systems: All other 10 point review of systems is negative.   Physical Exam:  height is 5\' 1"  (1.549 m) and weight is 107 lb (48.5 kg). Her oral temperature is 98.3 F (36.8 C). Her blood pressure is 136/47 (abnormal) and her pulse is 64. Her respiration is 18 and oxygen saturation is 100%.   Wt Readings from Last 3 Encounters:  05/31/23 107 lb (48.5 kg)  03/30/23 109 lb 12.8 oz (49.8 kg)  01/17/23 112 lb (50.8 kg)    Ocular: Sclerae unicteric, pupils equal, round and reactive to light Ear-nose-throat: Oropharynx clear, dentition fair Lymphatic: No cervical or supraclavicular adenopathy Lungs no rales or rhonchi, good excursion bilaterally Heart regular rate and rhythm, no murmur appreciated. 1+ pitting edema bilaterally  Abd soft, nontender, positive bowel sounds MSK no focal spinal tenderness, no joint edema Neuro: non-focal, well-oriented, appropriate affect   Lab Results  Component Value Date   WBC 3.5 (L) 05/31/2023   HGB 10.3 (L) 05/31/2023   HCT 31.6 (L) 05/31/2023   MCV 98.8 05/31/2023   PLT 151 05/31/2023   Lab Results  Component Value Date   FERRITIN 255 11/15/2022   IRON 97 11/15/2022   TIBC 304 11/15/2022   UIBC 207 11/15/2022   IRONPCTSAT 32 (H) 11/15/2022   Lab Results  Component Value Date   RETICCTPCT 1.3 11/15/2022   RBC 3.20 (L) 05/31/2023   No results found for: "KPAFRELGTCHN", "LAMBDASER", "KAPLAMBRATIO" No results found for: "IGGSERUM", "IGA", "IGMSERUM" No results found for: "TOTALPROTELP", "ALBUMINELP", "A1GS", "A2GS", "BETS", "BETA2SER", "GAMS", "MSPIKE", "SPEI"   Chemistry      Component Value Date/Time   NA 140 05/31/2023 0935   NA 145 02/13/2017 1046   NA 142 12/23/2016 0951   K 3.8 05/31/2023 0935   K 4.6  02/13/2017 1046   K 3.9 12/23/2016 0951   CL 105 05/31/2023 0935   CL 108 02/13/2017 1046   CO2 28 05/31/2023 0935   CO2 28 02/13/2017 1046   CO2 25 12/23/2016 0951   BUN 12 05/31/2023 0935   BUN 22 02/13/2017 1046   BUN 15.0 12/23/2016 0951   CREATININE 0.86 05/31/2023 0935   CREATININE 1.1 02/13/2017 1046   CREATININE 0.9 12/23/2016 0951      Component Value Date/Time   CALCIUM 9.7 05/31/2023 0935   CALCIUM 9.7 02/13/2017 1046   CALCIUM 9.5 12/23/2016 0951   ALKPHOS 49 05/31/2023 0935   ALKPHOS 60 02/13/2017 1046   ALKPHOS 69 12/23/2016 0951   AST 17 05/31/2023 0935   AST 26 12/23/2016 0951   ALT 9 05/31/2023 0935   ALT 18  02/13/2017 1046   ALT 16 12/23/2016 0951   BILITOT 1.1 05/31/2023 0935   BILITOT 0.96 12/23/2016 1610     Encounter Diagnoses  Name Primary?   Erythropoietin deficiency anemia Yes   Iron deficiency anemia due to chronic blood loss    Nutritional deficiency     Impression and Plan: Ms. Orne is a very pleasant 78 yo African American female with anemia of chronic renal insufficiency and erythropoietin deficiency.  CBC changes likely reflective of her resolving UTI- we will monitor.  ESA today, Hgb 10.3 Iron studies are pending.  Will replace if needed   RTC 4 weeks APP, labs (CBC, CMP, iron, ferritin, B12, folate)-[place]  Rushie Chestnut, PA-C 3/12/202511:07 AM

## 2023-05-31 NOTE — Patient Instructions (Signed)

## 2023-06-01 ENCOUNTER — Encounter: Payer: Self-pay | Admitting: Medical Oncology

## 2023-06-28 ENCOUNTER — Inpatient Hospital Stay: Admitting: Medical Oncology

## 2023-06-28 ENCOUNTER — Inpatient Hospital Stay: Attending: Family

## 2023-06-28 ENCOUNTER — Encounter: Payer: Self-pay | Admitting: Medical Oncology

## 2023-06-28 VITALS — BP 136/56 | HR 55 | Temp 98.1°F | Resp 18 | Ht 61.0 in | Wt 111.0 lb

## 2023-06-28 DIAGNOSIS — E639 Nutritional deficiency, unspecified: Secondary | ICD-10-CM

## 2023-06-28 DIAGNOSIS — D631 Anemia in chronic kidney disease: Secondary | ICD-10-CM | POA: Insufficient documentation

## 2023-06-28 DIAGNOSIS — D500 Iron deficiency anemia secondary to blood loss (chronic): Secondary | ICD-10-CM | POA: Diagnosis not present

## 2023-06-28 DIAGNOSIS — I129 Hypertensive chronic kidney disease with stage 1 through stage 4 chronic kidney disease, or unspecified chronic kidney disease: Secondary | ICD-10-CM | POA: Insufficient documentation

## 2023-06-28 DIAGNOSIS — N189 Chronic kidney disease, unspecified: Secondary | ICD-10-CM | POA: Insufficient documentation

## 2023-06-28 DIAGNOSIS — E538 Deficiency of other specified B group vitamins: Secondary | ICD-10-CM | POA: Insufficient documentation

## 2023-06-28 LAB — IRON AND IRON BINDING CAPACITY (CC-WL,HP ONLY)
Iron: 67 ug/dL (ref 28–170)
Saturation Ratios: 23 % (ref 10.4–31.8)
TIBC: 293 ug/dL (ref 250–450)
UIBC: 226 ug/dL (ref 148–442)

## 2023-06-28 LAB — FERRITIN: Ferritin: 270 ng/mL (ref 11–307)

## 2023-06-28 LAB — CBC WITH DIFFERENTIAL (CANCER CENTER ONLY)
Abs Immature Granulocytes: 0.03 10*3/uL (ref 0.00–0.07)
Basophils Absolute: 0 10*3/uL (ref 0.0–0.1)
Basophils Relative: 1 %
Eosinophils Absolute: 0.1 10*3/uL (ref 0.0–0.5)
Eosinophils Relative: 2 %
HCT: 33.9 % — ABNORMAL LOW (ref 36.0–46.0)
Hemoglobin: 11 g/dL — ABNORMAL LOW (ref 12.0–15.0)
Immature Granulocytes: 1 %
Lymphocytes Relative: 35 %
Lymphs Abs: 1.9 10*3/uL (ref 0.7–4.0)
MCH: 32.5 pg (ref 26.0–34.0)
MCHC: 32.4 g/dL (ref 30.0–36.0)
MCV: 100.3 fL — ABNORMAL HIGH (ref 80.0–100.0)
Monocytes Absolute: 0.4 10*3/uL (ref 0.1–1.0)
Monocytes Relative: 8 %
Neutro Abs: 2.8 10*3/uL (ref 1.7–7.7)
Neutrophils Relative %: 53 %
Platelet Count: 175 10*3/uL (ref 150–400)
RBC: 3.38 MIL/uL — ABNORMAL LOW (ref 3.87–5.11)
RDW: 13.1 % (ref 11.5–15.5)
WBC Count: 5.3 10*3/uL (ref 4.0–10.5)
nRBC: 0 % (ref 0.0–0.2)

## 2023-06-28 LAB — CMP (CANCER CENTER ONLY)
ALT: 10 U/L (ref 0–44)
AST: 18 U/L (ref 15–41)
Albumin: 4.6 g/dL (ref 3.5–5.0)
Alkaline Phosphatase: 49 U/L (ref 38–126)
Anion gap: 8 (ref 5–15)
BUN: 17 mg/dL (ref 8–23)
CO2: 28 mmol/L (ref 22–32)
Calcium: 10.1 mg/dL (ref 8.9–10.3)
Chloride: 106 mmol/L (ref 98–111)
Creatinine: 0.91 mg/dL (ref 0.44–1.00)
GFR, Estimated: >60 mL/min (ref >60)
Glucose, Bld: 102 mg/dL — ABNORMAL HIGH (ref 70–99)
Potassium: 3.8 mmol/L (ref 3.5–5.1)
Sodium: 142 mmol/L (ref 135–145)
Total Bilirubin: 0.8 mg/dL (ref 0.0–1.2)
Total Protein: 7.6 g/dL (ref 6.5–8.1)

## 2023-06-28 LAB — RETIC PANEL
Immature Retic Fract: 12 % (ref 2.3–15.9)
RBC.: 3.42 MIL/uL — ABNORMAL LOW (ref 3.87–5.11)
Retic Count, Absolute: 51.6 10*3/uL (ref 19.0–186.0)
Retic Ct Pct: 1.5 % (ref 0.4–3.1)
Reticulocyte Hemoglobin: 35.1 pg (ref >27.9)

## 2023-06-28 LAB — VITAMIN B12: Vitamin B-12: 2557 pg/mL — ABNORMAL HIGH (ref 180–914)

## 2023-06-28 LAB — FOLATE: Folate: 24.6 ng/mL (ref >5.9)

## 2023-06-28 NOTE — Progress Notes (Signed)
 Hematology and Oncology Follow Up Visit  Rachael Davenport [id number] [birth year] 78 y.o. 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    Interim History:  Rachael Davenport is here today for follow-up.   Today she states that she is doing well. She has no concerns today.   No issues with blood loss. No bruising or petechiae.  No fever, chills, n/v, cough, rash, dizziness, chest pain, abdominal pain or changes in bowel or bladder habits.  No falls or syncope reported.  Mild hemorrhoidal bleeding. UTD on colonoscopy -2025 Appetite and hydration are good  Wt Readings from Last 3 Encounters:  06/28/23 111 lb (50.3 kg)  05/31/23 107 lb (48.5 kg)  03/30/23 109 lb 12.8 oz (49.8 kg)    ECOG Performance Status: 1 - Symptomatic but completely ambulatory  Medications:  Allergies as of 06/28/2023       Reactions   Penicillins Hives, Itching, Swelling   Has patient had a PCN reaction causing immediate rash, facial/tongue/throat swelling, SOB or lightheadedness with hypotension: Yes Has patient had a PCN reaction causing severe rash involving mucus membranes or skin necrosis: No Has patient had a PCN reaction that required hospitalization: No Has patient had a PCN reaction occurring within the last 10 years: No If all of the above answers are "NO", then may proceed with Cephalosporin use.   Valsartan Itching, Other (See Comments)   Heart sped up   Amlodipine Itching   Patient can take lower dose Norvasc    Cephalexin Diarrhea        Medication List        Accurate as of June 28, 2023  9:59 AM. If you  have any questions, ask your nurse or doctor.          STOP taking these medications    cephALEXin 500 MG capsule Commonly known as: KEFLEX Stopped by: Rushie Chestnut       TAKE these medications    amLODipine 2.5 MG tablet Commonly known as: NORVASC Take 2.5 mg by mouth daily.   aspirin EC 81 MG tablet Take 81 mg by mouth daily. Swallow whole.   carvedilol 12.5 MG tablet Commonly known as: COREG Take 12.5 mg by mouth 2 (two) times daily with a meal.   cyanocobalamin 1000 MCG tablet Commonly known as: VITAMIN B12 Take 1,000 mcg by mouth daily.   Estradiol 4 MCG Inst Imvexxy Maintenance Pack 4 mcg vaginal insert  Insert 1 vaginal insert twice a week by vaginal route.   furosemide 20 MG tablet Commonly known as: LASIX Take by mouth.   polyethylene glycol 17 g packet Commonly known as: MIRALAX / GLYCOLAX Take 17 g by mouth daily as needed.   pravastatin 10 MG tablet Commonly known as: PRAVACHOL pravastatin 10 mg tablet   Restasis 0.05 % ophthalmic emulsion Generic drug: cycloSPORINE INT 1 GTT IN OU BID UTD   SUMAtriptan 25 MG tablet Commonly known as: IMITREX Take 25 mg by mouth once as needed for migraine (MAY REPEAT ONCE IN 2 HOURS IF NO RELIEF (MAX 2 TABLETS/24 HOURS)).   Vitamin D (Ergocalciferol) 1.25 MG (  50000 UNIT) Caps capsule Commonly known as: DRISDOL Take 50,000 Units by mouth every 7 (seven) days.        Allergies:  Allergies  Allergen Reactions   Penicillins Hives, Itching and Swelling    Has patient had a PCN reaction causing immediate rash, facial/tongue/throat swelling, SOB or lightheadedness with hypotension: Yes Has patient had a PCN reaction causing severe rash involving mucus membranes or skin necrosis: No Has patient had a PCN reaction that required hospitalization: No Has patient had a PCN reaction occurring within the last 10 years: No If all of the above answers are "NO", then may proceed with Cephalosporin use.     Valsartan Itching and Other (See Comments)    Heart sped up   Amlodipine Itching    Patient can take lower dose Norvasc    Cephalexin Diarrhea    Past Medical History, Surgical history, Social history, and Family History were reviewed and updated.  Review of Systems: All other 10 point review of systems is negative.   Physical Exam:  height is 5\' 1"  (1.549 m) and weight is 111 lb (50.3 kg). Her oral temperature is 98.1 F (36.7 C). Her blood pressure is 136/56 (abnormal) and her pulse is 55 (abnormal). Her respiration is 18 and oxygen saturation is 100%.   Wt Readings from Last 3 Encounters:  06/28/23 111 lb (50.3 kg)  05/31/23 107 lb (48.5 kg)  03/30/23 109 lb 12.8 oz (49.8 kg)    Ocular: Sclerae unicteric, pupils equal, round and reactive to light Ear-nose-throat: Oropharynx clear, dentition fair Lymphatic: No cervical or supraclavicular adenopathy Lungs no rales or rhonchi, good excursion bilaterally Heart regular rate and rhythm, no murmur appreciated. 1+ pitting edema bilaterally  Abd soft, nontender, positive bowel sounds MSK no focal spinal tenderness, no joint edema Neuro: non-focal, well-oriented, appropriate affect   Lab Results  Component Value Date   WBC 5.3 06/28/2023   HGB 11.0 (L) 06/28/2023   HCT 33.9 (L) 06/28/2023   MCV 100.3 (H) 06/28/2023   PLT 175 06/28/2023   Lab Results  Component Value Date   FERRITIN 311 (H) 05/31/2023   IRON 97 05/31/2023   TIBC 291 05/31/2023   UIBC 194 05/31/2023   IRONPCTSAT 33 (H) 05/31/2023   Lab Results  Component Value Date   RETICCTPCT 1.5 06/28/2023   RBC 3.42 (L) 06/28/2023   No results found for: "KPAFRELGTCHN", "LAMBDASER", "KAPLAMBRATIO" No results found for: "IGGSERUM", "IGA", "IGMSERUM" No results found for: "TOTALPROTELP", "ALBUMINELP", "A1GS", "A2GS", "BETS", "BETA2SER", "GAMS", "MSPIKE", "SPEI"   Chemistry      Component Value Date/Time   NA 142 06/28/2023 0918   NA 145 02/13/2017 1046   NA 142  12/23/2016 0951   K 3.8 06/28/2023 0918   K 4.6 02/13/2017 1046   K 3.9 12/23/2016 0951   CL 106 06/28/2023 0918   CL 108 02/13/2017 1046   CO2 28 06/28/2023 0918   CO2 28 02/13/2017 1046   CO2 25 12/23/2016 0951   BUN 17 06/28/2023 0918   BUN 22 02/13/2017 1046   BUN 15.0 12/23/2016 0951   CREATININE 0.91 06/28/2023 0918   CREATININE 1.1 02/13/2017 1046   CREATININE 0.9 12/23/2016 0951      Component Value Date/Time   CALCIUM 10.1 06/28/2023 0918   CALCIUM 9.7 02/13/2017 1046   CALCIUM 9.5 12/23/2016 0951   ALKPHOS 49 06/28/2023 0918   ALKPHOS 60 02/13/2017 1046   ALKPHOS 69 12/23/2016 0951   AST 18 06/28/2023 0918   AST 26  12/23/2016 0951   ALT 10 06/28/2023 0918   ALT 18 02/13/2017 1046   ALT 16 12/23/2016 0951   BILITOT 0.8 06/28/2023 0918   BILITOT 0.96 12/23/2016 0951     Encounter Diagnoses  Name Primary?   Erythropoietin deficiency anemia Yes   Iron deficiency anemia due to chronic blood loss    Nutritional deficiency    Anemia associated with chronic renal failure     Impression and Plan: Rachael Davenport is a very pleasant 78 yo African American female with anemia of chronic renal insufficiency and erythropoietin deficiency. She also has a history of B12 deficiency with B12 supplementation on hold given recent elevated values.   Hgb today is 11- no ESA Iron studies are pending.  Will replace if needed B12 lab pending.   RTC 4 weeks APP, labs (CBC, CMP, iron, ferritin, B12)-[place]  Rushie Chestnut, PA-C 4/9/20259:59 AM

## 2023-06-29 ENCOUNTER — Encounter: Payer: Self-pay | Admitting: Medical Oncology

## 2023-07-27 ENCOUNTER — Inpatient Hospital Stay

## 2023-07-27 ENCOUNTER — Encounter: Payer: Self-pay | Admitting: Medical Oncology

## 2023-07-27 ENCOUNTER — Inpatient Hospital Stay: Attending: Family

## 2023-07-27 ENCOUNTER — Inpatient Hospital Stay: Admitting: Medical Oncology

## 2023-07-27 VITALS — BP 133/56 | HR 53 | Temp 97.9°F | Resp 18 | Ht 61.0 in | Wt 108.0 lb

## 2023-07-27 DIAGNOSIS — D631 Anemia in chronic kidney disease: Secondary | ICD-10-CM | POA: Insufficient documentation

## 2023-07-27 DIAGNOSIS — D500 Iron deficiency anemia secondary to blood loss (chronic): Secondary | ICD-10-CM

## 2023-07-27 DIAGNOSIS — I129 Hypertensive chronic kidney disease with stage 1 through stage 4 chronic kidney disease, or unspecified chronic kidney disease: Secondary | ICD-10-CM | POA: Diagnosis present

## 2023-07-27 DIAGNOSIS — E639 Nutritional deficiency, unspecified: Secondary | ICD-10-CM

## 2023-07-27 DIAGNOSIS — N189 Chronic kidney disease, unspecified: Secondary | ICD-10-CM | POA: Diagnosis present

## 2023-07-27 LAB — CMP (CANCER CENTER ONLY)
ALT: 11 U/L (ref 0–44)
AST: 20 U/L (ref 15–41)
Albumin: 4.7 g/dL (ref 3.5–5.0)
Alkaline Phosphatase: 53 U/L (ref 38–126)
Anion gap: 7 (ref 5–15)
BUN: 20 mg/dL (ref 8–23)
CO2: 28 mmol/L (ref 22–32)
Calcium: 10.1 mg/dL (ref 8.9–10.3)
Chloride: 106 mmol/L (ref 98–111)
Creatinine: 1 mg/dL (ref 0.44–1.00)
GFR, Estimated: 58 mL/min — ABNORMAL LOW (ref >60)
Glucose, Bld: 98 mg/dL (ref 70–99)
Potassium: 4.1 mmol/L (ref 3.5–5.1)
Sodium: 141 mmol/L (ref 135–145)
Total Bilirubin: 0.9 mg/dL (ref 0.0–1.2)
Total Protein: 7.6 g/dL (ref 6.5–8.1)

## 2023-07-27 LAB — CBC
HCT: 32.7 % — ABNORMAL LOW (ref 36.0–46.0)
Hemoglobin: 10.5 g/dL — ABNORMAL LOW (ref 12.0–15.0)
MCH: 32.1 pg (ref 26.0–34.0)
MCHC: 32.1 g/dL (ref 30.0–36.0)
MCV: 100 fL (ref 80.0–100.0)
Platelets: 128 10*3/uL — ABNORMAL LOW (ref 150–400)
RBC: 3.27 MIL/uL — ABNORMAL LOW (ref 3.87–5.11)
RDW: 13 % (ref 11.5–15.5)
WBC: 4.5 10*3/uL (ref 4.0–10.5)
nRBC: 0 % (ref 0.0–0.2)

## 2023-07-27 LAB — RETIC PANEL
Immature Retic Fract: 10.6 % (ref 2.3–15.9)
RBC.: 3.26 MIL/uL — ABNORMAL LOW (ref 3.87–5.11)
Retic Count, Absolute: 65.9 10*3/uL (ref 19.0–186.0)
Retic Ct Pct: 2 % (ref 0.4–3.1)
Reticulocyte Hemoglobin: 35.4 pg (ref >27.9)

## 2023-07-27 LAB — IRON AND IRON BINDING CAPACITY (CC-WL,HP ONLY)
Iron: 84 ug/dL (ref 28–170)
Saturation Ratios: 28 % (ref 10.4–31.8)
TIBC: 298 ug/dL (ref 250–450)
UIBC: 214 ug/dL (ref 148–442)

## 2023-07-27 LAB — VITAMIN B12: Vitamin B-12: 1280 pg/mL — ABNORMAL HIGH (ref 180–914)

## 2023-07-27 LAB — FERRITIN: Ferritin: 381 ng/mL — ABNORMAL HIGH (ref 11–307)

## 2023-07-27 MED ORDER — EPOETIN ALFA-EPBX 40000 UNIT/ML IJ SOLN
40000.0000 [IU] | Freq: Once | INTRAMUSCULAR | Status: AC
  Administered 2023-07-27: 40000 [IU] via SUBCUTANEOUS
  Filled 2023-07-27: qty 1

## 2023-07-27 NOTE — Patient Instructions (Signed)

## 2023-07-27 NOTE — Patient Instructions (Signed)
Glycemic Index

## 2023-07-27 NOTE — Progress Notes (Signed)
 Hematology and Oncology Follow Up Visit  Rachael Davenport [id number] 07-11-1945 78 y.o. 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    Interim History:  [name] is here today for follow-up.   Today she states that she is doing well. She has no concerns today.  Chronic mild fatigue is stable.   No issues with blood loss. No bruising or petechiae.  No fever, chills, n/v, cough, rash, dizziness, chest pain, abdominal pain or changes in bowel or bladder habits.  No falls or syncope reported.  Mild hemorrhoidal bleeding. UTD on colonoscopy -2025 Appetite and hydration are good  Wt Readings from Last 3 Encounters:  07/27/23 108 lb (49 kg)  06/28/23 111 lb (50.3 kg)  05/31/23 107 lb (48.5 kg)    ECOG Performance Status: 1 - Symptomatic but completely ambulatory  Medications:  Allergies as of 07/27/2023       Reactions   Penicillins Hives, Itching, Swelling   Has patient had a PCN reaction causing immediate rash, facial/tongue/throat swelling, SOB or lightheadedness with hypotension: Yes Has patient had a PCN reaction causing severe rash involving mucus membranes or skin necrosis: No Has patient had a PCN reaction that required hospitalization: No Has patient had a PCN reaction occurring within the last 10 years: No If all of the above answers are "NO", then may proceed with Cephalosporin use.   Valsartan Itching, Other (See Comments)   Heart sped up   Amlodipine Itching   Patient can take lower dose Norvasc    Cephalexin Diarrhea        Medication List        Accurate as of Jul 27, 2023 11:38 AM. If you have any questions, ask your nurse or doctor.          amLODipine 2.5 MG tablet Commonly known as: NORVASC Take 2.5 mg by mouth daily.   aspirin EC 81 MG tablet Take 81 mg by mouth daily. Swallow whole.   carvedilol 12.5 MG tablet Commonly known as: COREG Take 12.5 mg by mouth 2 (two) times daily with a meal.   cyanocobalamin  1000 MCG tablet Commonly known as: VITAMIN B12 Take 1,000 mcg by mouth daily.   Estradiol 4 MCG Inst Imvexxy Maintenance Pack 4 mcg vaginal insert  Insert 1 vaginal insert twice a week by vaginal route.   furosemide  20 MG tablet Commonly known as: LASIX  Take by mouth.   polyethylene glycol 17 g packet Commonly known as: MIRALAX / GLYCOLAX Take 17 g by mouth daily as needed.   pravastatin 10 MG tablet Commonly known as: PRAVACHOL pravastatin 10 mg tablet   Restasis 0.05 % ophthalmic emulsion Generic drug: cycloSPORINE INT 1 GTT IN OU BID UTD   SUMAtriptan 25 MG tablet Commonly known as: IMITREX Take 25 mg by mouth once as needed for migraine (MAY REPEAT ONCE IN 2 HOURS IF NO RELIEF (MAX 2 TABLETS/24 HOURS)).   Vitamin D (Ergocalciferol) 1.25 MG (50000 UNIT) Caps capsule Commonly known as: DRISDOL Take 50,000 Units by mouth every 7 (seven) days.        Allergies:  Allergies  Allergen  Reactions   Penicillins Hives, Itching and Swelling    Has patient had a PCN reaction causing immediate rash, facial/tongue/throat swelling, SOB or lightheadedness with hypotension: Yes Has patient had a PCN reaction causing severe rash involving mucus membranes or skin necrosis: No Has patient had a PCN reaction that required hospitalization: No Has patient had a PCN reaction occurring within the last 10 years: No If all of the above answers are "NO", then may proceed with Cephalosporin use.    Valsartan Itching and Other (See Comments)    Heart sped up   Amlodipine Itching    Patient can take lower dose Norvasc    Cephalexin  Diarrhea    Past Medical History, Surgical history, Social history, and Family History were reviewed and updated.  Review of Systems: All other 10 point review of systems is negative.   Physical Exam:  height is 5\' 1"  (1.549 m) and weight is 108 lb (49 kg). Her oral temperature is 97.9 F (36.6 C). Her blood pressure is 133/56 (abnormal) and her pulse is 53 (abnormal). Her respiration is 18 and oxygen saturation is 100%.   Wt Readings from Last 3 Encounters:  07/27/23 108 lb (49 kg)  06/28/23 111 lb (50.3 kg)  05/31/23 107 lb (48.5 kg)    Ocular: Sclerae unicteric, pupils equal, round and reactive to light Ear-nose-throat: Oropharynx clear, dentition fair Lymphatic: No cervical or supraclavicular adenopathy Lungs no rales or rhonchi, good excursion bilaterally Heart regular rate and rhythm, no murmur appreciated. 1+ pitting edema bilaterally  Abd soft, nontender, positive bowel sounds MSK no focal spinal tenderness, no joint edema Neuro: non-focal, well-oriented, appropriate affect   Lab Results  Component Value Date   WBC 4.5 07/27/2023   HGB 10.5 (L) 07/27/2023   HCT 32.7 (L) 07/27/2023   MCV 100.0 07/27/2023   PLT 128 (L) 07/27/2023   Lab Results  Component Value Date   FERRITIN 270 06/28/2023   IRON  67 06/28/2023   TIBC 293 06/28/2023   UIBC 226 06/28/2023   IRONPCTSAT 23 06/28/2023   Lab Results  Component Value Date   RETICCTPCT 2.0 07/27/2023   RBC 3.26 (L) 07/27/2023   RBC 3.27 (L) 07/27/2023   No results found for: "KPAFRELGTCHN", "LAMBDASER", "KAPLAMBRATIO" No results found for: "IGGSERUM", "IGA", "IGMSERUM" No results found for: "TOTALPROTELP", "ALBUMINELP", "A1GS", "A2GS", "BETS", "BETA2SER", "GAMS", "MSPIKE", "SPEI"   Chemistry      Component Value Date/Time   NA 141 07/27/2023 1019   NA 145 02/13/2017 1046   NA 142 12/23/2016 0951   K 4.1 07/27/2023 1019   K 4.6 02/13/2017 1046   K 3.9 12/23/2016 0951   CL 106 07/27/2023 1019   CL 108  02/13/2017 1046   CO2 28 07/27/2023 1019   CO2 28 02/13/2017 1046   CO2 25 12/23/2016 0951   BUN 20 07/27/2023 1019   BUN 22 02/13/2017 1046   BUN 15.0 12/23/2016 0951   CREATININE 1.00 07/27/2023 1019   CREATININE 1.1 02/13/2017 1046   CREATININE 0.9 12/23/2016 0951      Component Value Date/Time   CALCIUM 10.1 07/27/2023 1019   CALCIUM 9.7 02/13/2017 1046   CALCIUM 9.5 12/23/2016 0951   ALKPHOS 53 07/27/2023 1019   ALKPHOS 60 02/13/2017 1046   ALKPHOS 69 12/23/2016 0951   AST 20 07/27/2023 1019   AST 26 12/23/2016 0951   ALT 11 07/27/2023 1019   ALT 18 02/13/2017 1046   ALT 16 12/23/2016 0951   BILITOT 0.9 07/27/2023 1019  BILITOT 0.96 12/23/2016 1610     Encounter Diagnoses  Name Primary?   Erythropoietin  deficiency anemia Yes   Iron  deficiency anemia due to chronic blood loss    Nutritional deficiency     Impression and Plan: Ms. Lloret is a very pleasant 78 yo African American female with anemia of chronic renal insufficiency and erythropoietin  deficiency. She also has a history of B12 deficiency with B12 supplementation on hold given recent elevated values.   Hgb today is 10.5- ESA today Platelets 128- will watch Iron  studies are pending.  Will replace if needed B12 lab pending.    ESA today RTC 4 weeks APP, labs (CBC, CMP, iron , ferritin, B12)+ ESA injection-[place]  Sharla Davis, [place] 5/8/202511:38 AM

## 2023-07-31 ENCOUNTER — Encounter: Payer: Self-pay | Admitting: Medical Oncology

## 2023-08-24 ENCOUNTER — Encounter: Payer: Self-pay | Admitting: Medical Oncology

## 2023-08-24 ENCOUNTER — Inpatient Hospital Stay

## 2023-08-24 ENCOUNTER — Inpatient Hospital Stay: Attending: Family

## 2023-08-24 ENCOUNTER — Inpatient Hospital Stay: Admitting: Medical Oncology

## 2023-08-24 VITALS — BP 129/77 | HR 56 | Temp 97.9°F | Resp 18 | Ht 61.0 in | Wt 108.1 lb

## 2023-08-24 DIAGNOSIS — N189 Chronic kidney disease, unspecified: Secondary | ICD-10-CM | POA: Diagnosis present

## 2023-08-24 DIAGNOSIS — D631 Anemia in chronic kidney disease: Secondary | ICD-10-CM | POA: Insufficient documentation

## 2023-08-24 DIAGNOSIS — E538 Deficiency of other specified B group vitamins: Secondary | ICD-10-CM | POA: Diagnosis not present

## 2023-08-24 DIAGNOSIS — E639 Nutritional deficiency, unspecified: Secondary | ICD-10-CM | POA: Insufficient documentation

## 2023-08-24 DIAGNOSIS — D500 Iron deficiency anemia secondary to blood loss (chronic): Secondary | ICD-10-CM

## 2023-08-24 LAB — CBC WITH DIFFERENTIAL (CANCER CENTER ONLY)
Abs Immature Granulocytes: 0.01 10*3/uL (ref 0.00–0.07)
Basophils Absolute: 0 10*3/uL (ref 0.0–0.1)
Basophils Relative: 1 %
Eosinophils Absolute: 0.1 10*3/uL (ref 0.0–0.5)
Eosinophils Relative: 2 %
HCT: 35.1 % — ABNORMAL LOW (ref 36.0–46.0)
Hemoglobin: 11.3 g/dL — ABNORMAL LOW (ref 12.0–15.0)
Immature Granulocytes: 0 %
Lymphocytes Relative: 33 %
Lymphs Abs: 1.4 10*3/uL (ref 0.7–4.0)
MCH: 31.5 pg (ref 26.0–34.0)
MCHC: 32.2 g/dL (ref 30.0–36.0)
MCV: 97.8 fL (ref 80.0–100.0)
Monocytes Absolute: 0.4 10*3/uL (ref 0.1–1.0)
Monocytes Relative: 10 %
Neutro Abs: 2.3 10*3/uL (ref 1.7–7.7)
Neutrophils Relative %: 54 %
Platelet Count: 132 10*3/uL — ABNORMAL LOW (ref 150–400)
RBC: 3.59 MIL/uL — ABNORMAL LOW (ref 3.87–5.11)
RDW: 12.6 % (ref 11.5–15.5)
WBC Count: 4.2 10*3/uL (ref 4.0–10.5)
nRBC: 0 % (ref 0.0–0.2)

## 2023-08-24 LAB — CMP (CANCER CENTER ONLY)
ALT: 10 U/L (ref 0–44)
AST: 19 U/L (ref 15–41)
Albumin: 4.4 g/dL (ref 3.5–5.0)
Alkaline Phosphatase: 51 U/L (ref 38–126)
Anion gap: 5 (ref 5–15)
BUN: 18 mg/dL (ref 8–23)
CO2: 29 mmol/L (ref 22–32)
Calcium: 9.7 mg/dL (ref 8.9–10.3)
Chloride: 108 mmol/L (ref 98–111)
Creatinine: 0.94 mg/dL (ref 0.44–1.00)
GFR, Estimated: >60 mL/min (ref >60)
Glucose, Bld: 89 mg/dL (ref 70–99)
Potassium: 4.8 mmol/L (ref 3.5–5.1)
Sodium: 142 mmol/L (ref 135–145)
Total Bilirubin: 0.8 mg/dL (ref 0.0–1.2)
Total Protein: 7.4 g/dL (ref 6.5–8.1)

## 2023-08-24 NOTE — Progress Notes (Signed)
 Hematology and Oncology Follow Up Visit  Rachael Davenport [id number] [birth year] 78 y.o. 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    Interim History:  Rachael Davenport is here today for follow-up.   She reports that she is doing well.  Chronic mild fatigue is stable.   No issues with blood loss. No bruising or petechiae.  No fever, chills, n/v, cough, rash, dizziness, chest pain, abdominal pain or changes in bowel or bladder habits.  No falls or syncope reported.  Mild hemorrhoidal bleeding. UTD on colonoscopy -2025 Appetite and hydration are good  Wt Readings from Last 3 Encounters:  08/24/23 108 lb 1.9 oz (49 kg)  07/27/23 108 lb (49 kg)  06/28/23 111 lb (50.3 kg)    ECOG Performance Status: 1 - Symptomatic but completely ambulatory  Medications:  Allergies as of 08/24/2023       Reactions   Penicillins Hives, Itching, Swelling   Has patient had a PCN reaction causing immediate rash, facial/tongue/throat swelling, SOB or lightheadedness with hypotension: Yes Has patient had a PCN reaction causing severe rash involving mucus membranes or skin necrosis: No Has patient had a PCN reaction that required hospitalization: No Has patient had a PCN reaction occurring within the last 10 years: No If all of the above answers are "NO", then may proceed with Cephalosporin use.   Valsartan Itching, Other (See Comments)   Heart sped up   Amlodipine Itching   Patient can take lower dose Norvasc    Cephalexin Diarrhea        Medication List        Accurate as of August 24, 2023 10:33 AM. If you have  any questions, ask your nurse or doctor.          amLODipine 2.5 MG tablet Commonly known as: NORVASC Take 2.5 mg by mouth daily.   aspirin EC 81 MG tablet Take 81 mg by mouth daily. Swallow whole.   carvedilol 12.5 MG tablet Commonly known as: COREG Take 12.5 mg by mouth 2 (two) times daily with a meal.   cyanocobalamin  1000 MCG tablet Commonly known as: VITAMIN B12 Take 1,000 mcg by mouth daily.   Estradiol 4 MCG Inst Imvexxy Maintenance Pack 4 mcg vaginal insert  Insert 1 vaginal insert twice a week by vaginal route.   furosemide  20 MG tablet Commonly known as: LASIX  Take by mouth.   polyethylene glycol 17 g packet Commonly known as: MIRALAX / GLYCOLAX Take 17 g by mouth daily as needed.   pravastatin 10 MG tablet Commonly known as: PRAVACHOL pravastatin 10 mg tablet   Restasis 0.05 % ophthalmic emulsion Generic drug: cycloSPORINE INT 1 GTT IN OU BID UTD   SUMAtriptan 25 MG tablet Commonly known as: IMITREX Take 25 mg by mouth once as needed for migraine (MAY REPEAT ONCE IN 2 HOURS IF NO RELIEF (MAX 2 TABLETS/24 HOURS)).   Vitamin D (Ergocalciferol) 1.25 MG (50000 UNIT) Caps capsule Commonly known as: DRISDOL Take 50,000 Units by mouth every 7 (seven) days.        Allergies:  Allergies  Allergen Reactions   Penicillins  Hives, Itching and Swelling    Has patient had a PCN reaction causing immediate rash, facial/tongue/throat swelling, SOB or lightheadedness with hypotension: Yes Has patient had a PCN reaction causing severe rash involving mucus membranes or skin necrosis: No Has patient had a PCN reaction that required hospitalization: No Has patient had a PCN reaction occurring within the last 10 years: No If all of the above answers are "NO", then may proceed with Cephalosporin use.    Valsartan Itching and Other (See Comments)    Heart sped up   Amlodipine Itching    Patient can take lower dose Norvasc    Cephalexin Diarrhea    Past Medical  History, Surgical history, Social history, and Family History were reviewed and updated.  Review of Systems: All other 10 point review of systems is negative.   Physical Exam:  height is 5\' 1"  (1.549 m) and weight is 108 lb 1.9 oz (49 kg). Her oral temperature is 97.9 F (36.6 C). Her blood pressure is 129/77 and her pulse is 56 (abnormal). Her respiration is 18 and oxygen saturation is 100%.   Wt Readings from Last 3 Encounters:  08/24/23 108 lb 1.9 oz (49 kg)  07/27/23 108 lb (49 kg)  06/28/23 111 lb (50.3 kg)    Ocular: Sclerae unicteric, pupils equal, round and reactive to light Ear-nose-throat: Oropharynx clear, dentition fair Lymphatic: No cervical or supraclavicular adenopathy Lungs no rales or rhonchi, good excursion bilaterally Heart regular rate and rhythm, no murmur appreciated. 1+ pitting edema bilaterally  Abd soft, nontender, positive bowel sounds MSK no focal spinal tenderness, no joint edema Neuro: non-focal, well-oriented, appropriate affect   Lab Results  Component Value Date   WBC 4.2 08/24/2023   HGB 11.3 (L) 08/24/2023   HCT 35.1 (L) 08/24/2023   MCV 97.8 08/24/2023   PLT 132 (L) 08/24/2023   Lab Results  Component Value Date   FERRITIN 381 (H) 07/27/2023   IRON  84 07/27/2023   TIBC 298 07/27/2023   UIBC 214 07/27/2023   IRONPCTSAT 28 07/27/2023   Lab Results  Component Value Date   RETICCTPCT 2.0 07/27/2023   RBC 3.59 (L) 08/24/2023   No results found for: "KPAFRELGTCHN", "LAMBDASER", "KAPLAMBRATIO" No results found for: "IGGSERUM", "IGA", "IGMSERUM" No results found for: "TOTALPROTELP", "ALBUMINELP", "A1GS", "A2GS", "BETS", "BETA2SER", "GAMS", "MSPIKE", "SPEI"   Chemistry      Component Value Date/Time   NA 141 07/27/2023 1019   NA 145 02/13/2017 1046   NA 142 12/23/2016 0951   K 4.1 07/27/2023 1019   K 4.6 02/13/2017 1046   K 3.9 12/23/2016 0951   CL 106 07/27/2023 1019   CL 108 02/13/2017 1046   CO2 28 07/27/2023 1019   CO2 28  02/13/2017 1046   CO2 25 12/23/2016 0951   BUN 20 07/27/2023 1019   BUN 22 02/13/2017 1046   BUN 15.0 12/23/2016 0951   CREATININE 1.00 07/27/2023 1019   CREATININE 1.1 02/13/2017 1046   CREATININE 0.9 12/23/2016 0951      Component Value Date/Time   CALCIUM 10.1 07/27/2023 1019   CALCIUM 9.7 02/13/2017 1046   CALCIUM 9.5 12/23/2016 0951   ALKPHOS 53 07/27/2023 1019   ALKPHOS 60 02/13/2017 1046   ALKPHOS 69 12/23/2016 0951   AST 20 07/27/2023 1019   AST 26 12/23/2016 0951   ALT 11 07/27/2023 1019   ALT 18 02/13/2017 1046   ALT 16 12/23/2016 0951   BILITOT 0.9 07/27/2023 1019   BILITOT 0.96 12/23/2016 0951  Encounter Diagnoses  Name Primary?   Erythropoietin  deficiency anemia Yes   Iron  deficiency anemia due to chronic blood loss    Nutritional deficiency    Anemia associated with chronic renal failure     Impression and Plan: Ms. Keil is a very pleasant 78 yo African American female with anemia of chronic renal insufficiency and erythropoietin  deficiency. She also has a history of B12 deficiency with B12 supplementation on hold given recent elevated values.   Hgb today is 11.3- ESA today Platelets improved to 132 from 128 No B12 today given elevated B12 level.  Iron  studies are pending.  Will replace if needed   No ESA today RTC 4 weeks APP, labs (CBC, CMP, iron , ferritin, B12)+ ESA injection-[place]  Sharla Davis, PA-C 6/5/202510:33 AM

## 2023-09-21 ENCOUNTER — Inpatient Hospital Stay

## 2023-09-21 ENCOUNTER — Inpatient Hospital Stay: Admitting: Medical Oncology

## 2023-09-21 ENCOUNTER — Inpatient Hospital Stay: Attending: Family

## 2023-09-21 VITALS — BP 148/53 | HR 56 | Temp 98.3°F | Resp 17 | Ht 61.0 in | Wt 111.8 lb

## 2023-09-21 DIAGNOSIS — D631 Anemia in chronic kidney disease: Secondary | ICD-10-CM | POA: Diagnosis not present

## 2023-09-21 DIAGNOSIS — N189 Chronic kidney disease, unspecified: Secondary | ICD-10-CM

## 2023-09-21 DIAGNOSIS — D696 Thrombocytopenia, unspecified: Secondary | ICD-10-CM | POA: Diagnosis not present

## 2023-09-21 DIAGNOSIS — I129 Hypertensive chronic kidney disease with stage 1 through stage 4 chronic kidney disease, or unspecified chronic kidney disease: Secondary | ICD-10-CM | POA: Diagnosis present

## 2023-09-21 DIAGNOSIS — E639 Nutritional deficiency, unspecified: Secondary | ICD-10-CM

## 2023-09-21 DIAGNOSIS — Z79899 Other long term (current) drug therapy: Secondary | ICD-10-CM | POA: Diagnosis not present

## 2023-09-21 DIAGNOSIS — D500 Iron deficiency anemia secondary to blood loss (chronic): Secondary | ICD-10-CM | POA: Insufficient documentation

## 2023-09-21 DIAGNOSIS — E538 Deficiency of other specified B group vitamins: Secondary | ICD-10-CM | POA: Insufficient documentation

## 2023-09-21 DIAGNOSIS — N184 Chronic kidney disease, stage 4 (severe): Secondary | ICD-10-CM | POA: Insufficient documentation

## 2023-09-21 DIAGNOSIS — K648 Other hemorrhoids: Secondary | ICD-10-CM | POA: Insufficient documentation

## 2023-09-21 LAB — CMP (CANCER CENTER ONLY)
ALT: 9 U/L (ref 0–44)
AST: 18 U/L (ref 15–41)
Albumin: 4.3 g/dL (ref 3.5–5.0)
Alkaline Phosphatase: 48 U/L (ref 38–126)
Anion gap: 7 (ref 5–15)
BUN: 20 mg/dL (ref 8–23)
CO2: 29 mmol/L (ref 22–32)
Calcium: 9.6 mg/dL (ref 8.9–10.3)
Chloride: 104 mmol/L (ref 98–111)
Creatinine: 0.97 mg/dL (ref 0.44–1.00)
GFR, Estimated: 60 mL/min — ABNORMAL LOW
Glucose, Bld: 102 mg/dL — ABNORMAL HIGH (ref 70–99)
Potassium: 4.1 mmol/L (ref 3.5–5.1)
Sodium: 140 mmol/L (ref 135–145)
Total Bilirubin: 1.1 mg/dL (ref 0.0–1.2)
Total Protein: 7.3 g/dL (ref 6.5–8.1)

## 2023-09-21 LAB — IRON AND IRON BINDING CAPACITY (CC-WL,HP ONLY)
Iron: 85 ug/dL (ref 28–170)
Saturation Ratios: 30 % (ref 10.4–31.8)
TIBC: 286 ug/dL (ref 250–450)
UIBC: 201 ug/dL (ref 148–442)

## 2023-09-21 LAB — RETIC PANEL
Immature Retic Fract: 13.6 % (ref 2.3–15.9)
RBC.: 3.24 MIL/uL — ABNORMAL LOW (ref 3.87–5.11)
Retic Count, Absolute: 51.8 10*3/uL (ref 19.0–186.0)
Retic Ct Pct: 1.6 % (ref 0.4–3.1)
Reticulocyte Hemoglobin: 35.5 pg (ref >27.9)

## 2023-09-21 LAB — CBC WITH DIFFERENTIAL (CANCER CENTER ONLY)
Abs Immature Granulocytes: 0.01 10*3/uL (ref 0.00–0.07)
Basophils Absolute: 0 10*3/uL (ref 0.0–0.1)
Basophils Relative: 1 %
Eosinophils Absolute: 0.1 10*3/uL (ref 0.0–0.5)
Eosinophils Relative: 3 %
HCT: 31.1 % — ABNORMAL LOW (ref 36.0–46.0)
Hemoglobin: 10.2 g/dL — ABNORMAL LOW (ref 12.0–15.0)
Immature Granulocytes: 0 %
Lymphocytes Relative: 36 %
Lymphs Abs: 1.5 10*3/uL (ref 0.7–4.0)
MCH: 31.8 pg (ref 26.0–34.0)
MCHC: 32.8 g/dL (ref 30.0–36.0)
MCV: 96.9 fL (ref 80.0–100.0)
Monocytes Absolute: 0.6 10*3/uL (ref 0.1–1.0)
Monocytes Relative: 13 %
Neutro Abs: 2 10*3/uL (ref 1.7–7.7)
Neutrophils Relative %: 47 %
Platelet Count: 118 10*3/uL — ABNORMAL LOW (ref 150–400)
RBC: 3.21 MIL/uL — ABNORMAL LOW (ref 3.87–5.11)
RDW: 12.7 % (ref 11.5–15.5)
WBC Count: 4.2 10*3/uL (ref 4.0–10.5)
nRBC: 0 % (ref 0.0–0.2)

## 2023-09-21 LAB — FERRITIN: Ferritin: 393 ng/mL — ABNORMAL HIGH (ref 11–307)

## 2023-09-21 LAB — VITAMIN B12: Vitamin B-12: 988 pg/mL — ABNORMAL HIGH (ref 180–914)

## 2023-09-21 MED ORDER — EPOETIN ALFA-EPBX 40000 UNIT/ML IJ SOLN
40000.0000 [IU] | Freq: Once | INTRAMUSCULAR | Status: AC
  Administered 2023-09-21: 40000 [IU] via SUBCUTANEOUS
  Filled 2023-09-21: qty 1

## 2023-09-21 NOTE — Progress Notes (Signed)
 Hematology and Oncology Follow Up Visit  Rachael Davenport [id number] February 15, 1946 78 y.o. 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    Interim History:  Rachael Davenport is here today for follow-up.   Today she states that she has been doing well.  No issues with blood loss. No bruising or petechiae.  No fever, chills, n/v, cough, rash, dizziness, chest pain, abdominal pain or changes in bowel or bladder habits.  No falls or syncope reported.  Mild hemorrhoidal bleeding. UTD on colonoscopy -2025 Appetite and hydration are good  Wt Readings from Last 3 Encounters:  09/21/23 111 lb 12.8 oz (50.7 kg)  08/24/23 108 lb 1.9 oz (49 kg)  07/27/23 108 lb (49 kg)    ECOG Performance Status: 1 - Symptomatic but completely ambulatory  Medications:  Allergies as of 09/21/2023       Reactions   Penicillins Hives, Itching, Swelling   Has patient had a PCN reaction causing immediate rash, facial/tongue/throat swelling, SOB or lightheadedness with hypotension: Yes Has patient had a PCN reaction causing severe rash involving mucus membranes or skin necrosis: No Has patient had a PCN reaction that required hospitalization: No Has patient had a PCN reaction occurring within the last 10 years: No If all of the above answers are NO, then may proceed with Cephalosporin use.   Valsartan Itching, Other (See Comments)   Heart sped up   Amlodipine Itching   Patient can take lower dose Norvasc    Cephalexin Diarrhea        Medication List        Accurate as of September 21, 2023 11:10 AM. If you have any questions,  ask your nurse or doctor.          amLODipine 2.5 MG tablet Commonly known as: NORVASC Take 2.5 mg by mouth daily.   aspirin EC 81 MG tablet Take 81 mg by mouth daily. Swallow whole.   carvedilol 12.5 MG tablet Commonly known as: COREG Take 12.5 mg by mouth 2 (two) times daily with a meal.   cyanocobalamin  1000 MCG tablet Commonly known as: VITAMIN B12 Take 1,000 mcg by mouth daily.   Estradiol 4 MCG Inst Imvexxy Maintenance Pack 4 mcg vaginal insert  Insert 1 vaginal insert twice a week by vaginal route.   furosemide  20 MG tablet Commonly known as: LASIX  Take by mouth.   polyethylene glycol 17 g packet Commonly known as: MIRALAX / GLYCOLAX Take 17 g by mouth daily as needed.   pravastatin 10 MG tablet Commonly known as: PRAVACHOL pravastatin 10 mg tablet   Restasis 0.05 % ophthalmic emulsion Generic drug: cycloSPORINE INT 1 GTT IN OU BID UTD   SUMAtriptan 25 MG tablet Commonly known as: IMITREX Take 25 mg by mouth once as needed for migraine (MAY REPEAT ONCE IN 2 HOURS IF NO RELIEF (MAX 2 TABLETS/24 HOURS)).   Vitamin D (Ergocalciferol) 1.25 MG (50000 UNIT) Caps capsule Commonly known as: DRISDOL Take 50,000 Units by mouth every 7 (seven) days.        Allergies:  Allergies  Allergen Reactions   Penicillins Hives, Itching and  Swelling    Has patient had a PCN reaction causing immediate rash, facial/tongue/throat swelling, SOB or lightheadedness with hypotension: Yes Has patient had a PCN reaction causing severe rash involving mucus membranes or skin necrosis: No Has patient had a PCN reaction that required hospitalization: No Has patient had a PCN reaction occurring within the last 10 years: No If all of the above answers are NO, then may proceed with Cephalosporin use.    Valsartan Itching and Other (See Comments)    Heart sped up   Amlodipine Itching    Patient can take lower dose Norvasc    Cephalexin Diarrhea    Past Medical History,  Surgical history, Social history, and Family History were reviewed and updated.  Review of Systems: All other 10 point review of systems is negative.   Physical Exam:  height is 5' 1 (1.549 m) and weight is 111 lb 12.8 oz (50.7 kg). Her oral temperature is 98.3 F (36.8 C). Her blood pressure is 148/53 (abnormal) and her pulse is 56 (abnormal). Her respiration is 17 and oxygen saturation is 100%.   Wt Readings from Last 3 Encounters:  09/21/23 111 lb 12.8 oz (50.7 kg)  08/24/23 108 lb 1.9 oz (49 kg)  07/27/23 108 lb (49 kg)    Ocular: Sclerae unicteric, pupils equal, round and reactive to light Ear-nose-throat: Oropharynx clear, dentition fair Lymphatic: No cervical or supraclavicular adenopathy Lungs no rales or rhonchi, good excursion bilaterally Heart regular rate and rhythm, no murmur appreciated. 1+ pitting edema bilaterally  Abd soft, nontender, positive bowel sounds MSK no focal spinal tenderness, no joint edema Neuro: non-focal, well-oriented, appropriate affect Skin: No bleeding or bruising visible    Lab Results  Component Value Date   WBC 4.2 09/21/2023   HGB 10.2 (L) 09/21/2023   HCT 31.1 (L) 09/21/2023   MCV 96.9 09/21/2023   PLT 118 (L) 09/21/2023   Lab Results  Component Value Date   FERRITIN 381 (H) 07/27/2023   IRON  84 07/27/2023   TIBC 298 07/27/2023   UIBC 214 07/27/2023   IRONPCTSAT 28 07/27/2023   Lab Results  Component Value Date   RETICCTPCT 1.6 09/21/2023   RBC 3.24 (L) 09/21/2023   No results found for: KPAFRELGTCHN, LAMBDASER, KAPLAMBRATIO No results found for: IGGSERUM, IGA, IGMSERUM No results found for: STEPHANY CARLOTA BENSON MARKEL EARLA JOANNIE DOC VICK, SPEI   Chemistry      Component Value Date/Time   NA 140 09/21/2023 1037   NA 145 02/13/2017 1046   NA 142 12/23/2016 0951   K 4.1 09/21/2023 1037   K 4.6 02/13/2017 1046   K 3.9 12/23/2016 0951   CL 104 09/21/2023 1037   CL 108  02/13/2017 1046   CO2 29 09/21/2023 1037   CO2 28 02/13/2017 1046   CO2 25 12/23/2016 0951   BUN 20 09/21/2023 1037   BUN 22 02/13/2017 1046   BUN 15.0 12/23/2016 0951   CREATININE 0.97 09/21/2023 1037   CREATININE 1.1 02/13/2017 1046   CREATININE 0.9 12/23/2016 0951      Component Value Date/Time   CALCIUM 9.6 09/21/2023 1037   CALCIUM 9.7 02/13/2017 1046   CALCIUM 9.5 12/23/2016 0951   ALKPHOS 48 09/21/2023 1037   ALKPHOS 60 02/13/2017 1046   ALKPHOS 69 12/23/2016 0951   AST 18 09/21/2023 1037   AST 26 12/23/2016 0951   ALT 9 09/21/2023 1037   ALT 18 02/13/2017 1046   ALT 16 12/23/2016 0951   BILITOT 1.1 09/21/2023 1037  BILITOT 0.96 12/23/2016 9048     Encounter Diagnoses  Name Primary?   Anemia associated with chronic renal failure Yes   Nutritional deficiency    Iron  deficiency anemia due to chronic blood loss    Erythropoietin  deficiency anemia    Thrombocytopenia (HCC)     Impression and Plan: Rachael Davenport is a very pleasant 78 yo African American female with anemia of chronic renal insufficiency and erythropoietin  deficiency. She also has a history of B12 deficiency with B12 supplementation on hold given recent elevated values.   Hgb today is 10.2- ESA today Platelets down from 132 to 118- will continue to monitor closely. Normal folate levels recently. Normal B12. No history of hepatitis or HIV. Will add on MDS panel and JAK 2 labs for next visit  Iron  studies are pending.  Will replace if needed   ESA today RTC 4 weeks APP, labs (CBC, CMP, iron , ferritin, B12, MDS panel, JAK2)+ ESA injection-[place]  Lauraine CHRISTELLA Dais, PA-C 7/3/202511:10 AM

## 2023-09-21 NOTE — Patient Instructions (Signed)

## 2023-09-27 ENCOUNTER — Ambulatory Visit: Payer: Self-pay | Admitting: Medical Oncology

## 2023-10-05 ENCOUNTER — Ambulatory Visit: Payer: Self-pay | Admitting: Medical Oncology

## 2023-10-05 LAB — MYELODYSPLASTIC SYNDROME (MDS), FISH PANEL

## 2023-10-19 ENCOUNTER — Inpatient Hospital Stay

## 2023-10-19 ENCOUNTER — Inpatient Hospital Stay (HOSPITAL_BASED_OUTPATIENT_CLINIC_OR_DEPARTMENT_OTHER): Admitting: Medical Oncology

## 2023-10-19 VITALS — BP 142/54 | HR 63 | Temp 98.2°F | Resp 17 | Ht 61.0 in | Wt 108.4 lb

## 2023-10-19 DIAGNOSIS — D631 Anemia in chronic kidney disease: Secondary | ICD-10-CM | POA: Diagnosis not present

## 2023-10-19 DIAGNOSIS — E639 Nutritional deficiency, unspecified: Secondary | ICD-10-CM

## 2023-10-19 DIAGNOSIS — N189 Chronic kidney disease, unspecified: Secondary | ICD-10-CM | POA: Diagnosis not present

## 2023-10-19 DIAGNOSIS — D500 Iron deficiency anemia secondary to blood loss (chronic): Secondary | ICD-10-CM

## 2023-10-19 DIAGNOSIS — I129 Hypertensive chronic kidney disease with stage 1 through stage 4 chronic kidney disease, or unspecified chronic kidney disease: Secondary | ICD-10-CM | POA: Diagnosis not present

## 2023-10-19 DIAGNOSIS — D696 Thrombocytopenia, unspecified: Secondary | ICD-10-CM

## 2023-10-19 LAB — CBC WITH DIFFERENTIAL (CANCER CENTER ONLY)
Abs Immature Granulocytes: 0.01 K/uL (ref 0.00–0.07)
Basophils Absolute: 0.1 K/uL (ref 0.0–0.1)
Basophils Relative: 1 %
Eosinophils Absolute: 0.1 K/uL (ref 0.0–0.5)
Eosinophils Relative: 2 %
HCT: 33.7 % — ABNORMAL LOW (ref 36.0–46.0)
Hemoglobin: 10.9 g/dL — ABNORMAL LOW (ref 12.0–15.0)
Immature Granulocytes: 0 %
Lymphocytes Relative: 35 %
Lymphs Abs: 1.5 K/uL (ref 0.7–4.0)
MCH: 31.5 pg (ref 26.0–34.0)
MCHC: 32.3 g/dL (ref 30.0–36.0)
MCV: 97.4 fL (ref 80.0–100.0)
Monocytes Absolute: 0.6 K/uL (ref 0.1–1.0)
Monocytes Relative: 14 %
Neutro Abs: 2 K/uL (ref 1.7–7.7)
Neutrophils Relative %: 48 %
Platelet Count: 129 K/uL — ABNORMAL LOW (ref 150–400)
RBC: 3.46 MIL/uL — ABNORMAL LOW (ref 3.87–5.11)
RDW: 12.7 % (ref 11.5–15.5)
WBC Count: 4.3 K/uL (ref 4.0–10.5)
nRBC: 0 % (ref 0.0–0.2)

## 2023-10-19 LAB — CMP (CANCER CENTER ONLY)
ALT: 11 U/L (ref 0–44)
AST: 25 U/L (ref 15–41)
Albumin: 4.4 g/dL (ref 3.5–5.0)
Alkaline Phosphatase: 53 U/L (ref 38–126)
Anion gap: 11 (ref 5–15)
BUN: 21 mg/dL (ref 8–23)
CO2: 24 mmol/L (ref 22–32)
Calcium: 9.8 mg/dL (ref 8.9–10.3)
Chloride: 105 mmol/L (ref 98–111)
Creatinine: 0.95 mg/dL (ref 0.44–1.00)
GFR, Estimated: >60 mL/min (ref >60)
Glucose, Bld: 94 mg/dL (ref 70–99)
Potassium: 4 mmol/L (ref 3.5–5.1)
Sodium: 140 mmol/L (ref 135–145)
Total Bilirubin: 1 mg/dL (ref 0.0–1.2)
Total Protein: 7.3 g/dL (ref 6.5–8.1)

## 2023-10-19 LAB — IRON AND IRON BINDING CAPACITY (CC-WL,HP ONLY)
Iron: 66 ug/dL (ref 28–170)
Saturation Ratios: 22 % (ref 10.4–31.8)
TIBC: 301 ug/dL (ref 250–450)
UIBC: 235 ug/dL

## 2023-10-19 LAB — FERRITIN: Ferritin: 219 ng/mL (ref 11–307)

## 2023-10-19 LAB — VITAMIN B12: Vitamin B-12: 885 pg/mL (ref 180–914)

## 2023-10-19 LAB — LACTATE DEHYDROGENASE: LDH: 236 U/L — ABNORMAL HIGH (ref 98–192)

## 2023-10-19 MED ORDER — EPOETIN ALFA-EPBX 40000 UNIT/ML IJ SOLN
40000.0000 [IU] | Freq: Once | INTRAMUSCULAR | Status: AC
  Administered 2023-10-19: 40000 [IU] via SUBCUTANEOUS
  Filled 2023-10-19: qty 1

## 2023-10-19 NOTE — Progress Notes (Signed)
 Hematology and Oncology Follow Up Visit  Rachael Davenport [id number] 06-Sep-1945 78 y.o. 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    Interim History:  [name] is here today for follow-up.   Today she states that she has been doing well. Her husband is currently in the hospital with a diabetic foot ulcer with vascular complications. She is eager to get to see him.   No issues with blood loss. No bruising or petechiae.  No fever, chills, n/v, cough, rash, dizziness, chest pain, abdominal pain or changes in bowel or bladder habits.  No falls or syncope reported.  Mild hemorrhoidal bleeding. UTD on colonoscopy -2025 Appetite and hydration are good   Wt Readings from Last 3 Encounters:  10/19/23 108 lb 6.4 oz (49.2 kg)  09/21/23 111 lb 12.8 oz (50.7 kg)  08/24/23 108 lb 1.9 oz (49 kg)    ECOG Performance Status: 1 - Symptomatic but completely ambulatory  Medications:  Allergies as of 10/19/2023       Reactions   Penicillins Hives, Itching, Swelling   Has patient had a PCN reaction causing immediate rash, facial/tongue/throat swelling, SOB or lightheadedness with hypotension: Yes Has patient had a PCN reaction causing severe rash involving mucus membranes or skin necrosis: No Has patient had a PCN reaction that required hospitalization: No Has patient had a PCN reaction occurring within the last 10 years: No If all of the above answers are NO, then may proceed with Cephalosporin use.   Valsartan Itching, Other (See Comments)   Heart sped up   Amlodipine Itching   Patient can take lower  dose Norvasc    Cephalexin Diarrhea        Medication List        Accurate as of October 19, 2023 11:18 AM. If you have any questions, ask your nurse or doctor.          amLODipine 2.5 MG tablet Commonly known as: NORVASC Take 2.5 mg by mouth daily.   aspirin EC 81 MG tablet Take 81 mg by mouth daily. Swallow whole.   carvedilol 12.5 MG tablet Commonly known as: COREG Take 12.5 mg by mouth 2 (two) times daily with a meal.   cyanocobalamin  1000 MCG tablet Commonly known as: VITAMIN B12 Take 1,000 mcg by mouth daily.   Estradiol 4 MCG Inst Imvexxy Maintenance Pack 4 mcg vaginal insert  Insert 1 vaginal insert twice a week by vaginal route.   furosemide  20 MG tablet Commonly known as: LASIX  Take by mouth.   polyethylene glycol 17 g packet Commonly known as: MIRALAX / GLYCOLAX Take 17 g by mouth daily as needed.   pravastatin 10 MG tablet Commonly known as: PRAVACHOL pravastatin 10 mg tablet   Restasis 0.05 % ophthalmic emulsion Generic drug: cycloSPORINE INT 1 GTT IN OU BID UTD   SUMAtriptan 25 MG tablet Commonly known as: IMITREX Take 25 mg by mouth once as needed for migraine (MAY REPEAT ONCE IN 2 HOURS IF NO RELIEF (MAX 2 TABLETS/24 HOURS)).   Vitamin D (Ergocalciferol) 1.25 MG (50000 UNIT) Caps capsule Commonly known as: DRISDOL Take  50,000 Units by mouth every 7 (seven) days.        Allergies:  Allergies  Allergen Reactions   Penicillins Hives, Itching and Swelling    Has patient had a PCN reaction causing immediate rash, facial/tongue/throat swelling, SOB or lightheadedness with hypotension: Yes Has patient had a PCN reaction causing severe rash involving mucus membranes or skin necrosis: No Has patient had a PCN reaction that required hospitalization: No Has patient had a PCN reaction occurring within the last 10 years: No If all of the above answers are NO, then may proceed with Cephalosporin use.    Valsartan Itching and Other (See  Comments)    Heart sped up   Amlodipine Itching    Patient can take lower dose Norvasc    Cephalexin Diarrhea    Past Medical History, Surgical history, Social history, and Family History were reviewed and updated.  Review of Systems: All other 10 point review of systems is negative.   Physical Exam:  height is 5' 1 (1.549 m) and weight is 108 lb 6.4 oz (49.2 kg). Her oral temperature is 98.2 F (36.8 C). Her blood pressure is 142/54 (abnormal) and her pulse is 63. Her respiration is 17 and oxygen saturation is 100%.   Wt Readings from Last 3 Encounters:  10/19/23 108 lb 6.4 oz (49.2 kg)  09/21/23 111 lb 12.8 oz (50.7 kg)  08/24/23 108 lb 1.9 oz (49 kg)    Ocular: Sclerae unicteric, pupils equal, round and reactive to light Ear-nose-throat: Oropharynx clear, dentition fair Lymphatic: No cervical or supraclavicular adenopathy Lungs no rales or rhonchi, good excursion bilaterally Heart regular rate and rhythm, no murmur appreciated. 1+ pitting edema bilaterally  Abd soft, nontender, positive bowel sounds MSK no focal spinal tenderness, no joint edema Neuro: non-focal, well-oriented, appropriate affect Skin: No bleeding or bruising visible    Lab Results  Component Value Date   WBC 4.3 10/19/2023   HGB 10.9 (L) 10/19/2023   HCT 33.7 (L) 10/19/2023   MCV 97.4 10/19/2023   PLT PENDING 10/19/2023   Lab Results  Component Value Date   FERRITIN 393 (H) 09/21/2023   IRON  85 09/21/2023   TIBC 286 09/21/2023   UIBC 201 09/21/2023   IRONPCTSAT 30 09/21/2023   Lab Results  Component Value Date   RETICCTPCT 1.6 09/21/2023   RBC 3.46 (L) 10/19/2023   No results found for: KPAFRELGTCHN, LAMBDASER, KAPLAMBRATIO No results found for: KIMBERLY LE, IGMSERUM No results found for: STEPHANY CARLOTA BENSON MARKEL EARLA JOANNIE DOC VICK, SPEI   Chemistry      Component Value Date/Time   NA 140 10/19/2023 1040   NA 145 02/13/2017 1046    NA 142 12/23/2016 0951   K 4.0 10/19/2023 1040   K 4.6 02/13/2017 1046   K 3.9 12/23/2016 0951   CL 105 10/19/2023 1040   CL 108 02/13/2017 1046   CO2 24 10/19/2023 1040   CO2 28 02/13/2017 1046   CO2 25 12/23/2016 0951   BUN 21 10/19/2023 1040   BUN 22 02/13/2017 1046   BUN 15.0 12/23/2016 0951   CREATININE 0.95 10/19/2023 1040   CREATININE 1.1 02/13/2017 1046   CREATININE 0.9 12/23/2016 0951      Component Value Date/Time   CALCIUM 9.8 10/19/2023 1040   CALCIUM 9.7 02/13/2017 1046   CALCIUM 9.5 12/23/2016 0951   ALKPHOS 53 10/19/2023 1040   ALKPHOS 60 02/13/2017 1046   ALKPHOS 69 12/23/2016 0951   AST 25 10/19/2023 1040   AST  26 12/23/2016 0951   ALT 11 10/19/2023 1040   ALT 18 02/13/2017 1046   ALT 16 12/23/2016 0951   BILITOT 1.0 10/19/2023 1040   BILITOT 0.96 12/23/2016 9048     Encounter Diagnoses  Name Primary?   Erythropoietin  deficiency anemia Yes   Iron  deficiency anemia due to chronic blood loss    Anemia associated with chronic renal failure    Nutritional deficiency    Thrombocytopenia (HCC)    Impression and Plan: Ms. Bousquet is a very pleasant 78 yo African American female with anemia of chronic renal insufficiency and erythropoietin  deficiency. She also has a history of B12 deficiency with B12 supplementation on hold given recent elevated values.   Hgb today is 10.9- ESA today Platelets are 129 today which is improved from 118 previous. Will continue to monitor  Normal folate levels recently. Normal B12. No history of hepatitis or HIV.  MDS panel and JAK 2 panels pending Iron  studies are pending.  Will replace if needed   ESA today RTC 4 weeks APP, labs (CBC, CMP, iron , ferritin, B12)+ ESA injection-[place]  Lauraine CHRISTELLA Dais, PA-C 7/31/202511:18 AM

## 2023-10-19 NOTE — Patient Instructions (Signed)

## 2023-10-25 ENCOUNTER — Other Ambulatory Visit: Payer: Self-pay | Admitting: *Deleted

## 2023-11-14 ENCOUNTER — Encounter: Payer: Self-pay | Admitting: Medical Oncology

## 2023-11-14 ENCOUNTER — Inpatient Hospital Stay: Attending: Family

## 2023-11-14 ENCOUNTER — Inpatient Hospital Stay

## 2023-11-14 ENCOUNTER — Inpatient Hospital Stay: Admitting: Medical Oncology

## 2023-11-14 VITALS — BP 138/67 | HR 62 | Temp 98.2°F | Resp 18 | Ht 61.0 in | Wt 106.8 lb

## 2023-11-14 DIAGNOSIS — E639 Nutritional deficiency, unspecified: Secondary | ICD-10-CM

## 2023-11-14 DIAGNOSIS — D696 Thrombocytopenia, unspecified: Secondary | ICD-10-CM

## 2023-11-14 DIAGNOSIS — N189 Chronic kidney disease, unspecified: Secondary | ICD-10-CM | POA: Insufficient documentation

## 2023-11-14 DIAGNOSIS — D500 Iron deficiency anemia secondary to blood loss (chronic): Secondary | ICD-10-CM | POA: Diagnosis not present

## 2023-11-14 DIAGNOSIS — D631 Anemia in chronic kidney disease: Secondary | ICD-10-CM | POA: Diagnosis not present

## 2023-11-14 DIAGNOSIS — E538 Deficiency of other specified B group vitamins: Secondary | ICD-10-CM | POA: Insufficient documentation

## 2023-11-14 LAB — FERRITIN: Ferritin: 207 ng/mL (ref 11–307)

## 2023-11-14 LAB — CBC WITH DIFFERENTIAL (CANCER CENTER ONLY)
Abs Immature Granulocytes: 0.01 K/uL (ref 0.00–0.07)
Basophils Absolute: 0.1 K/uL (ref 0.0–0.1)
Basophils Relative: 1 %
Eosinophils Absolute: 0.1 K/uL (ref 0.0–0.5)
Eosinophils Relative: 3 %
HCT: 36.1 % (ref 36.0–46.0)
Hemoglobin: 11.8 g/dL — ABNORMAL LOW (ref 12.0–15.0)
Immature Granulocytes: 0 %
Lymphocytes Relative: 35 %
Lymphs Abs: 1.3 K/uL (ref 0.7–4.0)
MCH: 31.9 pg (ref 26.0–34.0)
MCHC: 32.7 g/dL (ref 30.0–36.0)
MCV: 97.6 fL (ref 80.0–100.0)
Monocytes Absolute: 0.4 K/uL (ref 0.1–1.0)
Monocytes Relative: 11 %
Neutro Abs: 1.9 K/uL (ref 1.7–7.7)
Neutrophils Relative %: 50 %
Platelet Count: 124 K/uL — ABNORMAL LOW (ref 150–400)
RBC: 3.7 MIL/uL — ABNORMAL LOW (ref 3.87–5.11)
RDW: 12.9 % (ref 11.5–15.5)
WBC Count: 3.8 K/uL — ABNORMAL LOW (ref 4.0–10.5)
nRBC: 0 % (ref 0.0–0.2)

## 2023-11-14 LAB — BASIC METABOLIC PANEL - CANCER CENTER ONLY
Anion gap: 12 (ref 5–15)
BUN: 22 mg/dL (ref 8–23)
CO2: 25 mmol/L (ref 22–32)
Calcium: 10.3 mg/dL (ref 8.9–10.3)
Chloride: 105 mmol/L (ref 98–111)
Creatinine: 1.03 mg/dL — ABNORMAL HIGH (ref 0.44–1.00)
GFR, Estimated: 55 mL/min — ABNORMAL LOW (ref >60)
Glucose, Bld: 115 mg/dL — ABNORMAL HIGH (ref 70–99)
Potassium: 4.7 mmol/L (ref 3.5–5.1)
Sodium: 142 mmol/L (ref 135–145)

## 2023-11-14 LAB — IRON AND IRON BINDING CAPACITY (CC-WL,HP ONLY)
Iron: 76 ug/dL (ref 28–170)
Saturation Ratios: 23 % (ref 10.4–31.8)
TIBC: 336 ug/dL (ref 250–450)
UIBC: 260 ug/dL

## 2023-11-14 LAB — VITAMIN B12: Vitamin B-12: 860 pg/mL (ref 180–914)

## 2023-11-14 NOTE — Progress Notes (Signed)
 Hematology and Oncology Follow Up Visit  Rachael Davenport [id number] 02-Feb-1946 78 y.o. 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    Interim History:  Rachael Davenport is here today for follow-up.   Today she is doing ok- she is caring for her husband and getting ready for carpel tunnel surgery.   No issues with blood loss. No bruising or petechiae.  No fever, chills, n/v, cough, rash, dizziness, chest pain, abdominal pain or changes in bowel or bladder habits.  No falls or syncope reported.  Mild hemorrhoidal bleeding PRN. UTD on colonoscopy -2025 Appetite and hydration are good   Wt Readings from Last 3 Encounters:  11/14/23 106 lb 12.8 oz (48.4 kg)  10/19/23 108 lb 6.4 oz (49.2 kg)  09/21/23 111 lb 12.8 oz (50.7 kg)    ECOG Performance Status: 1 - Symptomatic but completely ambulatory  Medications:  Allergies as of 11/14/2023       Reactions   Penicillins Hives, Itching, Swelling   Has patient had a PCN reaction causing immediate rash, facial/tongue/throat swelling, SOB or lightheadedness with hypotension: Yes Has patient had a PCN reaction causing severe rash involving mucus membranes or skin necrosis: No Has patient had a PCN reaction that required hospitalization: No Has patient had a PCN reaction occurring within the last 10 years: No If all of the above answers are NO, then may proceed with Cephalosporin use.   Valsartan Itching, Other (See Comments)   Heart sped up   Amlodipine Itching   Patient can take lower dose Norvasc    Cephalexin Diarrhea        Medication List         Accurate as of November 14, 2023 10:41 AM. If you have any questions, ask your nurse or doctor.          amLODipine 2.5 MG tablet Commonly known as: NORVASC Take 2.5 mg by mouth daily.   aspirin EC 81 MG tablet Take 81 mg by mouth daily. Swallow whole.   carvedilol 12.5 MG tablet Commonly known as: COREG Take 12.5 mg by mouth 2 (two) times daily with a meal.   cyanocobalamin  1000 MCG tablet Commonly known as: VITAMIN B12 Take 1,000 mcg by mouth daily.   Estradiol 4 MCG Inst Imvexxy Maintenance Pack 4 mcg vaginal insert  Insert 1 vaginal insert twice a week by vaginal route.   furosemide  20 MG tablet Commonly known as: LASIX  Take by mouth.   polyethylene glycol 17 g packet Commonly known as: MIRALAX / GLYCOLAX Take 17 g by mouth daily as needed.   pravastatin 10 MG tablet Commonly known as: PRAVACHOL pravastatin 10 mg tablet   Restasis 0.05 % ophthalmic emulsion Generic drug: cycloSPORINE INT 1 GTT IN OU BID UTD   SUMAtriptan 25 MG tablet Commonly known as: IMITREX Take 25 mg by mouth once as needed for migraine (MAY REPEAT ONCE IN 2 HOURS IF NO RELIEF (MAX 2 TABLETS/24 HOURS)).   Vitamin D (Ergocalciferol) 1.25 MG (50000 UNIT) Caps capsule Commonly known as: DRISDOL Take 50,000 Units by mouth every 7 (seven) days.  Allergies:  Allergies  Allergen Reactions   Penicillins Hives, Itching and Swelling    Has patient had a PCN reaction causing immediate rash, facial/tongue/throat swelling, SOB or lightheadedness with hypotension: Yes Has patient had a PCN reaction causing severe rash involving mucus membranes or skin necrosis: No Has patient had a PCN reaction that required hospitalization: No Has patient had a PCN reaction occurring within the last 10 years: No If all of the above answers are NO, then may proceed with Cephalosporin use.    Valsartan Itching and Other (See Comments)    Heart sped up   Amlodipine Itching    Patient can take  lower dose Norvasc    Cephalexin Diarrhea    Past Medical History, Surgical history, Social history, and Family History were reviewed and updated.  Review of Systems: All other 10 point review of systems is negative.   Physical Exam:  height is 5' 1 (1.549 m) and weight is 106 lb 12.8 oz (48.4 kg). Her oral temperature is 98.2 F (36.8 C). Her blood pressure is 138/67 and her pulse is 62. Her respiration is 18 and oxygen saturation is 100%.   Wt Readings from Last 3 Encounters:  11/14/23 106 lb 12.8 oz (48.4 kg)  10/19/23 108 lb 6.4 oz (49.2 kg)  09/21/23 111 lb 12.8 oz (50.7 kg)    Ocular: Sclerae unicteric, pupils equal, round and reactive to light Ear-nose-throat: Oropharynx clear, dentition fair Lymphatic: No cervical or supraclavicular adenopathy Lungs no rales or rhonchi, good excursion bilaterally Heart regular rate and rhythm, no murmur appreciated. 1+ pitting edema bilaterally  Abd soft, nontender, positive bowel sounds MSK no focal spinal tenderness, no joint edema Neuro: non-focal, well-oriented, appropriate affect Skin: No bleeding or bruising visible    Lab Results  Component Value Date   WBC 3.8 (L) 11/14/2023   HGB 11.8 (L) 11/14/2023   HCT 36.1 11/14/2023   MCV 97.6 11/14/2023   PLT 124 (L) 11/14/2023   Lab Results  Component Value Date   FERRITIN 219 10/19/2023   IRON  66 10/19/2023   TIBC 301 10/19/2023   UIBC 235 10/19/2023   IRONPCTSAT 22 10/19/2023   Lab Results  Component Value Date   RETICCTPCT 1.6 09/21/2023   RBC 3.70 (L) 11/14/2023   No results found for: KPAFRELGTCHN, LAMBDASER, KAPLAMBRATIO No results found for: IGGSERUM, IGA, IGMSERUM No results found for: STEPHANY CARLOTA BENSON MARKEL EARLA JOANNIE DOC, MSPIKE, SPEI   Chemistry      Component Value Date/Time   NA 142 11/14/2023 1007   NA 145 02/13/2017 1046   NA 142 12/23/2016 0951   K 4.7 11/14/2023 1007   K 4.6 02/13/2017 1046   K  3.9 12/23/2016 0951   CL 105 11/14/2023 1007   CL 108 02/13/2017 1046   CO2 25 11/14/2023 1007   CO2 28 02/13/2017 1046   CO2 25 12/23/2016 0951   BUN 22 11/14/2023 1007   BUN 22 02/13/2017 1046   BUN 15.0 12/23/2016 0951   CREATININE 1.03 (H) 11/14/2023 1007   CREATININE 1.1 02/13/2017 1046   CREATININE 0.9 12/23/2016 0951      Component Value Date/Time   CALCIUM 10.3 11/14/2023 1007   CALCIUM 9.7 02/13/2017 1046   CALCIUM 9.5 12/23/2016 0951   ALKPHOS 53 10/19/2023 1040   ALKPHOS 60 02/13/2017 1046   ALKPHOS 69 12/23/2016 0951   AST 25 10/19/2023 1040   AST 26 12/23/2016 0951   ALT 11 10/19/2023 1040   ALT 18 02/13/2017 1046  ALT 16 12/23/2016 0951   BILITOT 1.0 10/19/2023 1040   BILITOT 0.96 12/23/2016 9048     Encounter Diagnoses  Name Primary?   Erythropoietin  deficiency anemia Yes   Iron  deficiency anemia due to chronic blood loss    Nutritional deficiency    Thrombocytopenia (HCC)     Impression and Plan: Ms. Boster is a very pleasant 78 yo African American female with anemia of chronic renal insufficiency and erythropoietin  deficiency. She also has a history of B12 deficiency with B12 supplementation on hold given recent elevated values.   Hgb today is 11.8- No ESA today Platelets are 124 today. Will continue to monitor  Normal folate levels recently. Normal B12. No history of hepatitis or HIV.  MDS panel Normal- JAK 2 panels pending Iron  studies are pending.  Will replace if needed  Last B12 level was 885 on 10/19/2023  RTC 4 weeks APP, labs (CBC, CMP, iron , ferritin)+ ESA injection-[place]  Lauraine CHRISTELLA Dais, PA-C 8/26/202510:41 AM

## 2023-11-16 ENCOUNTER — Ambulatory Visit: Payer: Self-pay | Admitting: Medical Oncology

## 2023-11-22 LAB — INTELLIGEN MYELOID

## 2023-12-12 ENCOUNTER — Other Ambulatory Visit

## 2023-12-12 ENCOUNTER — Ambulatory Visit: Admitting: Medical Oncology

## 2023-12-13 ENCOUNTER — Inpatient Hospital Stay: Attending: Family

## 2023-12-13 ENCOUNTER — Inpatient Hospital Stay: Admitting: Family

## 2023-12-13 VITALS — BP 121/89 | HR 60 | Temp 98.3°F | Resp 17 | Wt 105.1 lb

## 2023-12-13 DIAGNOSIS — E611 Iron deficiency: Secondary | ICD-10-CM | POA: Insufficient documentation

## 2023-12-13 DIAGNOSIS — E538 Deficiency of other specified B group vitamins: Secondary | ICD-10-CM | POA: Insufficient documentation

## 2023-12-13 DIAGNOSIS — D696 Thrombocytopenia, unspecified: Secondary | ICD-10-CM

## 2023-12-13 DIAGNOSIS — D631 Anemia in chronic kidney disease: Secondary | ICD-10-CM | POA: Insufficient documentation

## 2023-12-13 DIAGNOSIS — N189 Chronic kidney disease, unspecified: Secondary | ICD-10-CM

## 2023-12-13 DIAGNOSIS — D500 Iron deficiency anemia secondary to blood loss (chronic): Secondary | ICD-10-CM

## 2023-12-13 DIAGNOSIS — E639 Nutritional deficiency, unspecified: Secondary | ICD-10-CM

## 2023-12-13 LAB — CMP (CANCER CENTER ONLY)
ALT: 11 U/L (ref 0–44)
AST: 24 U/L (ref 15–41)
Albumin: 4.6 g/dL (ref 3.5–5.0)
Alkaline Phosphatase: 51 U/L (ref 38–126)
Anion gap: 11 (ref 5–15)
BUN: 19 mg/dL (ref 8–23)
CO2: 25 mmol/L (ref 22–32)
Calcium: 10.4 mg/dL — ABNORMAL HIGH (ref 8.9–10.3)
Chloride: 105 mmol/L (ref 98–111)
Creatinine: 0.93 mg/dL (ref 0.44–1.00)
GFR, Estimated: >60 mL/min (ref >60)
Glucose, Bld: 104 mg/dL — ABNORMAL HIGH (ref 70–99)
Potassium: 4.7 mmol/L (ref 3.5–5.1)
Sodium: 141 mmol/L (ref 135–145)
Total Bilirubin: 1.1 mg/dL (ref 0.0–1.2)
Total Protein: 8 g/dL (ref 6.5–8.1)

## 2023-12-13 LAB — CBC WITH DIFFERENTIAL (CANCER CENTER ONLY)
Abs Immature Granulocytes: 0.01 K/uL (ref 0.00–0.07)
Basophils Absolute: 0 K/uL (ref 0.0–0.1)
Basophils Relative: 1 %
Eosinophils Absolute: 0.2 K/uL (ref 0.0–0.5)
Eosinophils Relative: 3 %
HCT: 34.8 % — ABNORMAL LOW (ref 36.0–46.0)
Hemoglobin: 11.4 g/dL — ABNORMAL LOW (ref 12.0–15.0)
Immature Granulocytes: 0 %
Lymphocytes Relative: 31 %
Lymphs Abs: 1.4 K/uL (ref 0.7–4.0)
MCH: 32 pg (ref 26.0–34.0)
MCHC: 32.8 g/dL (ref 30.0–36.0)
MCV: 97.8 fL (ref 80.0–100.0)
Monocytes Absolute: 0.4 K/uL (ref 0.1–1.0)
Monocytes Relative: 9 %
Neutro Abs: 2.5 K/uL (ref 1.7–7.7)
Neutrophils Relative %: 56 %
Platelet Count: 137 K/uL — ABNORMAL LOW (ref 150–400)
RBC: 3.56 MIL/uL — ABNORMAL LOW (ref 3.87–5.11)
RDW: 12.8 % (ref 11.5–15.5)
WBC Count: 4.4 K/uL (ref 4.0–10.5)
nRBC: 0 % (ref 0.0–0.2)

## 2023-12-13 LAB — IRON AND IRON BINDING CAPACITY (CC-WL,HP ONLY)
Iron: 86 ug/dL (ref 28–170)
Saturation Ratios: 26 % (ref 10.4–31.8)
TIBC: 332 ug/dL (ref 250–450)
UIBC: 246 ug/dL

## 2023-12-13 LAB — FERRITIN: Ferritin: 412 ng/mL — ABNORMAL HIGH (ref 11–307)

## 2023-12-13 NOTE — Progress Notes (Signed)
 Hematology and Oncology Follow Up Visit  Rachael Davenport [id number] May 16, 1945 78 y.o. 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 She is doing well and has no complaints at this time.  She had carpal tunnel repair on the right hand earlier this month and states that her recovery is going well. She had her stitches removed last week and incision is clean, dry and intact. No redness or edema noted.  No fever, chills, n/v, cough, rash, dizziness, SOB, chest pain, palpitations, abdomina pain or changes in bowel or bladder habits.  No numbness or tingling in her extremities.  No falls or syncope.  Appetite and hydration are good. Weight is 105 lbs.  No blood loss, bruising or petechiae noted.   ECOG Performance Status: 1 - Symptomatic but completely ambulatory  Medications:  Allergies as of 12/13/2023       Reactions   Penicillins Hives, Itching, Swelling   Has patient had a PCN reaction causing immediate rash, facial/tongue/throat swelling, SOB or lightheadedness with hypotension: Yes Has patient had a PCN reaction causing severe rash involving mucus membranes or skin necrosis: No Has patient had a PCN reaction that required hospitalization: No Has patient had a PCN reaction occurring within the last 10 years: No If all of the above answers are NO, then may proceed with Cephalosporin use.   Valsartan Itching, Other (See Comments)   Heart sped up   Amlodipine Itching   Patient can take lower dose Norvasc    Cephalexin Diarrhea        Medication List        Accurate as of December 13, 2023 10:05 AM. If you  have any questions, ask your nurse or doctor.          amLODipine 2.5 MG tablet Commonly known as: NORVASC Take 2.5 mg by mouth daily.   aspirin EC 81 MG tablet Take 81 mg by mouth daily. Swallow whole.   carvedilol 12.5 MG tablet Commonly known as: COREG Take 12.5 mg by mouth 2 (two) times daily with a meal.   cyanocobalamin  1000 MCG tablet Commonly known as: VITAMIN B12 Take 1,000 mcg by mouth daily.   Estradiol 4 MCG Inst Imvexxy Maintenance Pack 4 mcg vaginal insert  Insert 1 vaginal insert twice a week by vaginal route.   furosemide  20 MG tablet Commonly known as: LASIX  Take by mouth.   polyethylene glycol 17 g packet Commonly known as: MIRALAX / GLYCOLAX Take 17 g by mouth daily as needed.   pravastatin 10 MG tablet Commonly known as: PRAVACHOL pravastatin 10 mg tablet   Restasis 0.05 % ophthalmic emulsion Generic drug: cycloSPORINE INT 1 GTT IN OU BID UTD   SUMAtriptan 25 MG tablet Commonly known as: IMITREX Take 25 mg by mouth once as needed for migraine (MAY REPEAT ONCE IN 2 HOURS IF NO RELIEF (MAX 2 TABLETS/24 HOURS)).   Vitamin D (Ergocalciferol) 1.25 MG (50000 UNIT) Caps capsule Commonly known as: DRISDOL Take 50,000 Units by mouth every 7 (seven) days.        Allergies:  Allergies  Allergen Reactions   Penicillins Hives,  Itching and Swelling    Has patient had a PCN reaction causing immediate rash, facial/tongue/throat swelling, SOB or lightheadedness with hypotension: Yes Has patient had a PCN reaction causing severe rash involving mucus membranes or skin necrosis: No Has patient had a PCN reaction that required hospitalization: No Has patient had a PCN reaction occurring within the last 10 years: No If all of the above answers are NO, then may proceed with Cephalosporin use.    Valsartan Itching and Other (See Comments)    Heart sped up   Amlodipine Itching    Patient can take lower dose Norvasc    Cephalexin Diarrhea    Past  Medical History, Surgical history, Social history, and Family History were reviewed and updated.  Review of Systems: All other 10 point review of systems is negative.   Physical Exam:  vitals were not taken for this visit.   Wt Readings from Last 3 Encounters:  11/14/23 106 lb 12.8 oz (48.4 kg)  10/19/23 108 lb 6.4 oz (49.2 kg)  09/21/23 111 lb 12.8 oz (50.7 kg)    Ocular: Sclerae unicteric, pupils equal, round and reactive to light Ear-nose-throat: Oropharynx clear, dentition fair Lymphatic: No cervical or supraclavicular adenopathy Lungs no rales or rhonchi, good excursion bilaterally Heart regular rate and rhythm, no murmur appreciated Abd soft, nontender, positive bowel sounds MSK no focal spinal tenderness, no joint edema Neuro: non-focal, well-oriented, appropriate affect Breasts: Deferred   Lab Results  Component Value Date   WBC 3.8 (L) 11/14/2023   HGB 11.8 (L) 11/14/2023   HCT 36.1 11/14/2023   MCV 97.6 11/14/2023   PLT 124 (L) 11/14/2023   Lab Results  Component Value Date   FERRITIN 207 11/14/2023   IRON  76 11/14/2023   TIBC 336 11/14/2023   UIBC 260 11/14/2023   IRONPCTSAT 23 11/14/2023   Lab Results  Component Value Date   RETICCTPCT 1.6 09/21/2023   RBC 3.70 (L) 11/14/2023   No results found for: KPAFRELGTCHN, LAMBDASER, KAPLAMBRATIO No results found for: IGGSERUM, IGA, IGMSERUM No results found for: STEPHANY CARLOTA BENSON MARKEL EARLA JOANNIE DOC VICK, SPEI   Chemistry      Component Value Date/Time   NA 142 11/14/2023 1007   NA 145 02/13/2017 1046   NA 142 12/23/2016 0951   K 4.7 11/14/2023 1007   K 4.6 02/13/2017 1046   K 3.9 12/23/2016 0951   CL 105 11/14/2023 1007   CL 108 02/13/2017 1046   CO2 25 11/14/2023 1007   CO2 28 02/13/2017 1046   CO2 25 12/23/2016 0951   BUN 22 11/14/2023 1007   BUN 22 02/13/2017 1046   BUN 15.0 12/23/2016 0951   CREATININE 1.03 (H) 11/14/2023 1007   CREATININE  1.1 02/13/2017 1046   CREATININE 0.9 12/23/2016 0951      Component Value Date/Time   CALCIUM 10.3 11/14/2023 1007   CALCIUM 9.7 02/13/2017 1046   CALCIUM 9.5 12/23/2016 0951   ALKPHOS 53 10/19/2023 1040   ALKPHOS 60 02/13/2017 1046   ALKPHOS 69 12/23/2016 0951   AST 25 10/19/2023 1040   AST 26 12/23/2016 0951   ALT 11 10/19/2023 1040   ALT 18 02/13/2017 1046   ALT 16 12/23/2016 0951   BILITOT 1.0 10/19/2023 1040   BILITOT 0.96 12/23/2016 0951       Impression and Plan: Rachael Davenport is a very pleasant 78 yo African American female with anemia of chronic renal insufficiency and erythropoietin  deficiency as well as B 12 and iron  deficiency  anemias.  No ESA needed this visit, Hgb 11.4.  Iron  studies pending. We will replace if needed.  Follow-up in 1 month.   Lauraine Pepper, NP 9/24/202510:05 AM

## 2024-01-15 ENCOUNTER — Inpatient Hospital Stay: Attending: Family

## 2024-01-15 ENCOUNTER — Ambulatory Visit

## 2024-01-15 ENCOUNTER — Inpatient Hospital Stay: Admitting: Family

## 2024-01-15 ENCOUNTER — Encounter: Payer: Self-pay | Admitting: Family

## 2024-01-15 VITALS — BP 141/57 | HR 71 | Temp 97.8°F | Resp 18 | Ht 61.0 in | Wt 110.0 lb

## 2024-01-15 DIAGNOSIS — D500 Iron deficiency anemia secondary to blood loss (chronic): Secondary | ICD-10-CM

## 2024-01-15 DIAGNOSIS — E538 Deficiency of other specified B group vitamins: Secondary | ICD-10-CM | POA: Diagnosis not present

## 2024-01-15 DIAGNOSIS — D631 Anemia in chronic kidney disease: Secondary | ICD-10-CM | POA: Insufficient documentation

## 2024-01-15 DIAGNOSIS — N189 Chronic kidney disease, unspecified: Secondary | ICD-10-CM | POA: Insufficient documentation

## 2024-01-15 DIAGNOSIS — D509 Iron deficiency anemia, unspecified: Secondary | ICD-10-CM | POA: Diagnosis not present

## 2024-01-15 LAB — CBC WITH DIFFERENTIAL (CANCER CENTER ONLY)
Abs Immature Granulocytes: 0.09 K/uL — ABNORMAL HIGH (ref 0.00–0.07)
Basophils Absolute: 0.1 K/uL (ref 0.0–0.1)
Basophils Relative: 1 %
Eosinophils Absolute: 0 K/uL (ref 0.0–0.5)
Eosinophils Relative: 0 %
HCT: 35.1 % — ABNORMAL LOW (ref 36.0–46.0)
Hemoglobin: 11.6 g/dL — ABNORMAL LOW (ref 12.0–15.0)
Immature Granulocytes: 1 %
Lymphocytes Relative: 19 %
Lymphs Abs: 1.7 K/uL (ref 0.7–4.0)
MCH: 32.2 pg (ref 26.0–34.0)
MCHC: 33 g/dL (ref 30.0–36.0)
MCV: 97.5 fL (ref 80.0–100.0)
Monocytes Absolute: 0.7 K/uL (ref 0.1–1.0)
Monocytes Relative: 7 %
Neutro Abs: 6.6 K/uL (ref 1.7–7.7)
Neutrophils Relative %: 72 %
Platelet Count: 195 K/uL (ref 150–400)
RBC: 3.6 MIL/uL — ABNORMAL LOW (ref 3.87–5.11)
RDW: 12.9 % (ref 11.5–15.5)
WBC Count: 9.1 K/uL (ref 4.0–10.5)
nRBC: 0 % (ref 0.0–0.2)

## 2024-01-15 LAB — CMP (CANCER CENTER ONLY)
ALT: 13 U/L (ref 0–44)
AST: 23 U/L (ref 15–41)
Albumin: 4.8 g/dL (ref 3.5–5.0)
Alkaline Phosphatase: 58 U/L (ref 38–126)
Anion gap: 12 (ref 5–15)
BUN: 19 mg/dL (ref 8–23)
CO2: 27 mmol/L (ref 22–32)
Calcium: 10.3 mg/dL (ref 8.9–10.3)
Chloride: 103 mmol/L (ref 98–111)
Creatinine: 0.85 mg/dL (ref 0.44–1.00)
GFR, Estimated: >60 mL/min (ref >60)
Glucose, Bld: 150 mg/dL — ABNORMAL HIGH (ref 70–99)
Potassium: 4 mmol/L (ref 3.5–5.1)
Sodium: 142 mmol/L (ref 135–145)
Total Bilirubin: 1 mg/dL (ref 0.0–1.2)
Total Protein: 8 g/dL (ref 6.5–8.1)

## 2024-01-15 LAB — IRON AND IRON BINDING CAPACITY (CC-WL,HP ONLY)
Iron: 80 ug/dL (ref 28–170)
Saturation Ratios: 25 % (ref 10.4–31.8)
TIBC: 316 ug/dL (ref 250–450)
UIBC: 236 ug/dL

## 2024-01-15 LAB — RETICULOCYTES
Immature Retic Fract: 14.4 % (ref 2.3–15.9)
RBC.: 3.63 MIL/uL — ABNORMAL LOW (ref 3.87–5.11)
Retic Count, Absolute: 106 K/uL (ref 19.0–186.0)
Retic Ct Pct: 2.9 % (ref 0.4–3.1)

## 2024-01-15 LAB — FERRITIN: Ferritin: 409 ng/mL — ABNORMAL HIGH (ref 11–307)

## 2024-01-15 NOTE — Progress Notes (Signed)
 Hematology and Oncology Follow Up Visit  Rachael Davenport [id number] June 17, 1945 78 y.o. 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 She is doing well but the heat is out at her house so she has been staying with her son and then tonight with her granddaughter. She states that her heat will be fixed tomorrow.  Hgb is stable at 11.6. She has not noted any blood loss. No bruising or petechiae.  No fever, chills, n/v, cough, rash, dizziness, SOB, chest pain, abdominal pain or changes in bowel or bladder habits.  She has occasional palpitations with over exertion or excitement.  No swelling, tenderness, numbness or tingling in her extremities.  She is still in PT post carpal tunnel release of right hand.  No falls or syncope reported.  Appetite and hydration are good. Weight is stable at 110 lbs.  No blood loss, bruising or petechiae.   ECOG Performance Status: 0 - Asymptomatic  Medications:  Allergies as of 01/15/2024       Reactions   Penicillins Hives, Itching, Swelling   Has patient had a PCN reaction causing immediate rash, facial/tongue/throat swelling, SOB or lightheadedness with hypotension: Yes Has patient had a PCN reaction causing severe rash involving mucus membranes or skin necrosis: No Has patient had a PCN reaction that required hospitalization: No Has patient had a PCN reaction occurring within the last 10 years: No If all of the above answers are NO, then may proceed with Cephalosporin use.   Valsartan Itching, Other (See Comments)   Heart sped up   Amlodipine Itching   Patient can take lower dose  Norvasc    Cephalexin Diarrhea        Medication List        Accurate as of January 15, 2024 10:11 AM. If you have any questions, ask your nurse or doctor.          amLODipine 2.5 MG tablet Commonly known as: NORVASC Take 2.5 mg by mouth daily.   aspirin EC 81 MG tablet Take 81 mg by mouth daily. Swallow whole.   carvedilol 12.5 MG tablet Commonly known as: COREG Take 12.5 mg by mouth 2 (two) times daily with a meal.   cyanocobalamin  1000 MCG tablet Commonly known as: VITAMIN B12 Take 1,000 mcg by mouth daily.   Estradiol 4 MCG Inst Imvexxy Maintenance Pack 4 mcg vaginal insert  Insert 1 vaginal insert twice a week by vaginal route.   furosemide  20 MG tablet Commonly known as: LASIX  Take by mouth.   polyethylene glycol 17 g packet Commonly known as: MIRALAX / GLYCOLAX Take 17 g by mouth daily as needed.   pravastatin 10 MG tablet Commonly known as: PRAVACHOL pravastatin 10 mg tablet   Restasis 0.05 % ophthalmic emulsion Generic drug: cycloSPORINE INT 1 GTT IN OU BID UTD   SUMAtriptan 25 MG tablet Commonly known as: IMITREX Take 25 mg by mouth once as needed for migraine (MAY REPEAT ONCE IN 2 HOURS IF NO RELIEF (MAX 2 TABLETS/24 HOURS)).   Vitamin D (Ergocalciferol) 1.25 MG (50000 UNIT) Caps capsule Commonly known as: DRISDOL Take 50,000  Units by mouth every 7 (seven) days.        Allergies:  Allergies  Allergen Reactions   Penicillins Hives, Itching and Swelling    Has patient had a PCN reaction causing immediate rash, facial/tongue/throat swelling, SOB or lightheadedness with hypotension: Yes Has patient had a PCN reaction causing severe rash involving mucus membranes or skin necrosis: No Has patient had a PCN reaction that required hospitalization: No Has patient had a PCN reaction occurring within the last 10 years: No If all of the above answers are NO, then may proceed with Cephalosporin use.    Valsartan Itching and Other (See Comments)     Heart sped up   Amlodipine Itching    Patient can take lower dose Norvasc    Cephalexin Diarrhea    Past Medical History, Surgical history, Social history, and Family History were reviewed and updated.  Review of Systems: All other 10 point review of systems is negative.   Physical Exam:  vitals were not taken for this visit.   Wt Readings from Last 3 Encounters:  12/13/23 105 lb 1.9 oz (47.7 kg)  11/14/23 106 lb 12.8 oz (48.4 kg)  10/19/23 108 lb 6.4 oz (49.2 kg)    Ocular: Sclerae unicteric, pupils equal, round and reactive to light Ear-nose-throat: Oropharynx clear, dentition fair Lymphatic: No cervical or supraclavicular adenopathy Lungs no rales or rhonchi, good excursion bilaterally Heart regular rate and rhythm, no murmur appreciated Abd soft, nontender, positive bowel sounds MSK no focal spinal tenderness, no joint edema Neuro: non-focal, well-oriented, appropriate affect Breasts: Deferred   Lab Results  Component Value Date   WBC 9.1 01/15/2024   HGB 11.6 (L) 01/15/2024   HCT 35.1 (L) 01/15/2024   MCV 97.5 01/15/2024   PLT 195 01/15/2024   Lab Results  Component Value Date   FERRITIN 412 (H) 12/13/2023   IRON  86 12/13/2023   TIBC 332 12/13/2023   UIBC 246 12/13/2023   IRONPCTSAT 26 12/13/2023   Lab Results  Component Value Date   RETICCTPCT 2.9 01/15/2024   RBC 3.60 (L) 01/15/2024   No results found for: KPAFRELGTCHN, LAMBDASER, KAPLAMBRATIO No results found for: IGGSERUM, IGA, IGMSERUM No results found for: STEPHANY CARLOTA BENSON MARKEL EARLA JOANNIE DOC, MSPIKE, SPEI   Chemistry      Component Value Date/Time   NA 141 12/13/2023 1002   NA 145 02/13/2017 1046   NA 142 12/23/2016 0951   K 4.7 12/13/2023 1002   K 4.6 02/13/2017 1046   K 3.9 12/23/2016 0951   CL 105 12/13/2023 1002   CL 108 02/13/2017 1046   CO2 25 12/13/2023 1002   CO2 28 02/13/2017 1046   CO2 25 12/23/2016 0951   BUN 19  12/13/2023 1002   BUN 22 02/13/2017 1046   BUN 15.0 12/23/2016 0951   CREATININE 0.93 12/13/2023 1002   CREATININE 1.1 02/13/2017 1046   CREATININE 0.9 12/23/2016 0951      Component Value Date/Time   CALCIUM 10.4 (H) 12/13/2023 1002   CALCIUM 9.7 02/13/2017 1046   CALCIUM 9.5 12/23/2016 0951   ALKPHOS 51 12/13/2023 1002   ALKPHOS 60 02/13/2017 1046   ALKPHOS 69 12/23/2016 0951   AST 24 12/13/2023 1002   AST 26 12/23/2016 0951   ALT 11 12/13/2023 1002   ALT 18 02/13/2017 1046   ALT 16 12/23/2016 0951   BILITOT 1.1 12/13/2023 1002   BILITOT 0.96 12/23/2016 0951       Impression and Plan: Rachael Davenport is a  very pleasant 78 yo African American female with anemia of chronic renal insufficiency and erythropoietin  deficiency as well as B 12 and iron  deficiency anemias.  No ESA needed this visit, Hgb 11.6.  Iron  studies pending. We will replace if needed.  Follow-up in 6 weeks.   Lauraine Pepper, NP 10/27/202510:11 AM

## 2024-02-26 ENCOUNTER — Inpatient Hospital Stay: Admitting: Family

## 2024-02-26 ENCOUNTER — Inpatient Hospital Stay

## 2024-02-26 ENCOUNTER — Inpatient Hospital Stay: Attending: Family

## 2024-02-26 VITALS — BP 143/64 | HR 68 | Temp 98.3°F | Resp 17 | Wt 104.0 lb

## 2024-02-26 DIAGNOSIS — D500 Iron deficiency anemia secondary to blood loss (chronic): Secondary | ICD-10-CM

## 2024-02-26 DIAGNOSIS — N184 Chronic kidney disease, stage 4 (severe): Secondary | ICD-10-CM | POA: Diagnosis present

## 2024-02-26 DIAGNOSIS — D631 Anemia in chronic kidney disease: Secondary | ICD-10-CM

## 2024-02-26 DIAGNOSIS — N189 Chronic kidney disease, unspecified: Secondary | ICD-10-CM

## 2024-02-26 LAB — CBC WITH DIFFERENTIAL (CANCER CENTER ONLY)
Abs Immature Granulocytes: 0.01 K/uL (ref 0.00–0.07)
Basophils Absolute: 0 K/uL (ref 0.0–0.1)
Basophils Relative: 1 %
Eosinophils Absolute: 0.1 K/uL (ref 0.0–0.5)
Eosinophils Relative: 4 %
HCT: 31.7 % — ABNORMAL LOW (ref 36.0–46.0)
Hemoglobin: 10.3 g/dL — ABNORMAL LOW (ref 12.0–15.0)
Immature Granulocytes: 0 %
Lymphocytes Relative: 34 %
Lymphs Abs: 1.2 K/uL (ref 0.7–4.0)
MCH: 32 pg (ref 26.0–34.0)
MCHC: 32.5 g/dL (ref 30.0–36.0)
MCV: 98.4 fL (ref 80.0–100.0)
Monocytes Absolute: 0.4 K/uL (ref 0.1–1.0)
Monocytes Relative: 11 %
Neutro Abs: 1.7 K/uL (ref 1.7–7.7)
Neutrophils Relative %: 50 %
Platelet Count: 149 K/uL — ABNORMAL LOW (ref 150–400)
RBC: 3.22 MIL/uL — ABNORMAL LOW (ref 3.87–5.11)
RDW: 12.3 % (ref 11.5–15.5)
WBC Count: 3.4 K/uL — ABNORMAL LOW (ref 4.0–10.5)
nRBC: 0 % (ref 0.0–0.2)

## 2024-02-26 LAB — RETICULOCYTES
Immature Retic Fract: 8.9 % (ref 2.3–15.9)
RBC.: 3.23 MIL/uL — ABNORMAL LOW (ref 3.87–5.11)
Retic Count, Absolute: 61.4 K/uL (ref 19.0–186.0)
Retic Ct Pct: 1.9 % (ref 0.4–3.1)

## 2024-02-26 LAB — CMP (CANCER CENTER ONLY)
ALT: 13 U/L (ref 0–44)
AST: 27 U/L (ref 15–41)
Albumin: 4.6 g/dL (ref 3.5–5.0)
Alkaline Phosphatase: 46 U/L (ref 38–126)
Anion gap: 12 (ref 5–15)
BUN: 15 mg/dL (ref 8–23)
CO2: 25 mmol/L (ref 22–32)
Calcium: 10.1 mg/dL (ref 8.9–10.3)
Chloride: 104 mmol/L (ref 98–111)
Creatinine: 0.94 mg/dL (ref 0.44–1.00)
GFR, Estimated: >60 mL/min (ref >60)
Glucose, Bld: 103 mg/dL — ABNORMAL HIGH (ref 70–99)
Potassium: 4.2 mmol/L (ref 3.5–5.1)
Sodium: 141 mmol/L (ref 135–145)
Total Bilirubin: 1.2 mg/dL (ref 0.0–1.2)
Total Protein: 7.6 g/dL (ref 6.5–8.1)

## 2024-02-26 LAB — IRON AND IRON BINDING CAPACITY (CC-WL,HP ONLY)
Iron: 72 ug/dL (ref 28–170)
Saturation Ratios: 23 % (ref 10.4–31.8)
TIBC: 314 ug/dL (ref 250–450)
UIBC: 242 ug/dL

## 2024-02-26 LAB — FERRITIN: Ferritin: 442 ng/mL — ABNORMAL HIGH (ref 11–307)

## 2024-02-26 MED ORDER — EPOETIN ALFA-EPBX 40000 UNIT/ML IJ SOLN
40000.0000 [IU] | Freq: Once | INTRAMUSCULAR | Status: AC
  Administered 2024-02-26: 40000 [IU] via SUBCUTANEOUS
  Filled 2024-02-26: qty 1

## 2024-02-26 NOTE — Patient Instructions (Signed)

## 2024-02-26 NOTE — Progress Notes (Signed)
 Hematology and Oncology Follow Up Visit  Rachael Davenport [id number] 09-Jan-1946 78 y.o. 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 She is doing well and has no complaints at this time.  She has been busy during the Christmas season and has mild fatigue at times.  No issue with blood loss. No bruising or petechiae.  No fever, chills, n/v, cough, rash, dizziness, SOB, chest pain, palpitations, abdominal pain or changes in bowel or bladder habits.  No numbness or tingling in her extremities.  She is doing well in PT for the right hand post carpal tunnel release. Still has some mild swelling but there is improvement. She still is not driving. Her daughter brought her in today.  No falls or syncope reported.  Appetite and hydration are fair. Weight is 104 lbs.   ECOG Performance Status: 1 - Symptomatic but completely ambulatory  Medications:  Allergies as of 02/26/2024       Reactions   Penicillins Hives, Itching, Swelling   Has patient had a PCN reaction causing immediate rash, facial/tongue/throat swelling, SOB or lightheadedness with hypotension: Yes Has patient had a PCN reaction causing severe rash involving mucus membranes or skin necrosis: No Has patient had a PCN reaction that required hospitalization: No Has patient had a PCN reaction occurring within the last 10 years: No If all of the above answers are NO, then may proceed with Cephalosporin use.   Valsartan Itching, Other (See Comments)   Heart sped up   Amlodipine Itching   Patient can take lower dose Norvasc    Cephalexin Diarrhea         Medication List        Accurate as of February 26, 2024  9:56 AM. If you have any questions, ask your nurse or doctor.          amLODipine 2.5 MG tablet Commonly known as: NORVASC Take 2.5 mg by mouth daily.   aspirin EC 81 MG tablet Take 81 mg by mouth daily. Swallow whole.   carvedilol 12.5 MG tablet Commonly known as: COREG Take 12.5 mg by mouth 2 (two) times daily with a meal.   cyanocobalamin  1000 MCG tablet Commonly known as: VITAMIN B12 Take 1,000 mcg by mouth daily.   Estradiol 4 MCG Inst Imvexxy Maintenance Pack 4 mcg vaginal insert  Insert 1 vaginal insert twice a week by vaginal route.   furosemide  20 MG tablet Commonly known as: LASIX  Take by mouth.   polyethylene glycol 17 g packet Commonly known as: MIRALAX / GLYCOLAX Take 17 g by mouth daily as needed.   pravastatin 10 MG tablet Commonly known as: PRAVACHOL pravastatin 10 mg tablet   Restasis 0.05 % ophthalmic emulsion Generic drug: cycloSPORINE INT 1 GTT IN OU BID UTD   SUMAtriptan 25 MG tablet Commonly known as: IMITREX Take 25 mg by mouth once as needed for migraine (MAY REPEAT ONCE IN 2 HOURS IF NO RELIEF (MAX 2 TABLETS/24 HOURS)).   Vitamin D (Ergocalciferol) 1.25 MG (50000 UNIT) Caps capsule Commonly known as: DRISDOL Take 50,000 Units by mouth every 7 (seven) days.  Allergies:  Allergies  Allergen Reactions   Penicillins Hives, Itching and Swelling    Has patient had a PCN reaction causing immediate rash, facial/tongue/throat swelling, SOB or lightheadedness with hypotension: Yes Has patient had a PCN reaction causing severe rash involving mucus membranes or skin necrosis: No Has patient had a PCN reaction that required hospitalization: No Has patient had a PCN reaction occurring within the last 10 years: No If all of the above answers are NO, then may proceed with Cephalosporin use.    Valsartan Itching and Other (See Comments)    Heart sped up   Amlodipine  Itching    Patient can take lower dose Norvasc    Cephalexin Diarrhea    Past Medical History, Surgical history, Social history, and Family History were reviewed and updated.  Review of Systems: All other 10 point review of systems is negative.   Physical Exam:  vitals were not taken for this visit.   Wt Readings from Last 3 Encounters:  01/15/24 110 lb 0.6 oz (49.9 kg)  12/13/23 105 lb 1.9 oz (47.7 kg)  11/14/23 106 lb 12.8 oz (48.4 kg)    Ocular: Sclerae unicteric, pupils equal, round and reactive to light Ear-nose-throat: Oropharynx clear, dentition fair Lymphatic: No cervical or supraclavicular adenopathy Lungs no rales or rhonchi, good excursion bilaterally Heart regular rate and rhythm, no murmur appreciated Abd soft, nontender, positive bowel sounds MSK no focal spinal tenderness, no joint edema Neuro: non-focal, well-oriented, appropriate affect Breasts: Deferred   Lab Results  Component Value Date   WBC 9.1 01/15/2024   HGB 11.6 (L) 01/15/2024   HCT 35.1 (L) 01/15/2024   MCV 97.5 01/15/2024   PLT 195 01/15/2024   Lab Results  Component Value Date   FERRITIN 409 (H) 01/15/2024   IRON  80 01/15/2024   TIBC 316 01/15/2024   UIBC 236 01/15/2024   IRONPCTSAT 25 01/15/2024   Lab Results  Component Value Date   RETICCTPCT 2.9 01/15/2024   RBC 3.60 (L) 01/15/2024   No results found for: KPAFRELGTCHN, LAMBDASER, KAPLAMBRATIO No results found for: IGGSERUM, IGA, IGMSERUM No results found for: STEPHANY CARLOTA BENSON MARKEL EARLA JOANNIE DOC, MSPIKE, SPEI   Chemistry      Component Value Date/Time   NA 142 01/15/2024 0945   NA 145 02/13/2017 1046   NA 142 12/23/2016 0951   K 4.0 01/15/2024 0945   K 4.6 02/13/2017 1046   K 3.9 12/23/2016 0951   CL 103 01/15/2024 0945   CL 108 02/13/2017 1046   CO2 27 01/15/2024 0945   CO2 28 02/13/2017 1046   CO2 25 12/23/2016 0951   BUN 19 01/15/2024 0945   BUN 22 02/13/2017 1046    BUN 15.0 12/23/2016 0951   CREATININE 0.85 01/15/2024 0945   CREATININE 1.1 02/13/2017 1046   CREATININE 0.9 12/23/2016 0951      Component Value Date/Time   CALCIUM 10.3 01/15/2024 0945   CALCIUM 9.7 02/13/2017 1046   CALCIUM 9.5 12/23/2016 0951   ALKPHOS 58 01/15/2024 0945   ALKPHOS 60 02/13/2017 1046   ALKPHOS 69 12/23/2016 0951   AST 23 01/15/2024 0945   AST 26 12/23/2016 0951   ALT 13 01/15/2024 0945   ALT 18 02/13/2017 1046   ALT 16 12/23/2016 0951   BILITOT 1.0 01/15/2024 0945   BILITOT 0.96 12/23/2016 0951       Impression and Plan: Ms. Savoia is a very pleasant 78 yo African American female with anemia of chronic renal insufficiency and erythropoietin   deficiency as well as B 12 and iron  deficiency anemias.  ESA given this visit, Hgb 10.3.  Iron  studies pending. We will replace if needed.  Follow-up in 6 weeks.   Lauraine Pepper, NP 12/8/20259:56 AM

## 2024-03-19 ENCOUNTER — Encounter: Payer: Self-pay | Admitting: Internal Medicine

## 2024-03-19 DIAGNOSIS — Z1231 Encounter for screening mammogram for malignant neoplasm of breast: Secondary | ICD-10-CM

## 2024-04-08 ENCOUNTER — Inpatient Hospital Stay: Admitting: Family

## 2024-04-08 ENCOUNTER — Inpatient Hospital Stay

## 2024-04-16 ENCOUNTER — Inpatient Hospital Stay

## 2024-04-16 ENCOUNTER — Inpatient Hospital Stay: Admitting: Family

## 2024-04-17 ENCOUNTER — Inpatient Hospital Stay

## 2024-04-17 ENCOUNTER — Inpatient Hospital Stay: Admitting: Family

## 2024-04-18 ENCOUNTER — Inpatient Hospital Stay

## 2024-04-18 ENCOUNTER — Inpatient Hospital Stay: Admitting: Medical Oncology

## 2024-04-18 ENCOUNTER — Inpatient Hospital Stay: Attending: Family

## 2024-04-18 VITALS — BP 129/71 | HR 69 | Temp 97.8°F | Resp 17 | Ht 61.0 in | Wt 106.4 lb

## 2024-04-18 DIAGNOSIS — D500 Iron deficiency anemia secondary to blood loss (chronic): Secondary | ICD-10-CM

## 2024-04-18 DIAGNOSIS — D631 Anemia in chronic kidney disease: Secondary | ICD-10-CM

## 2024-04-18 DIAGNOSIS — N189 Chronic kidney disease, unspecified: Secondary | ICD-10-CM | POA: Diagnosis not present

## 2024-04-18 DIAGNOSIS — E639 Nutritional deficiency, unspecified: Secondary | ICD-10-CM

## 2024-04-18 DIAGNOSIS — D696 Thrombocytopenia, unspecified: Secondary | ICD-10-CM

## 2024-04-18 LAB — CMP (CANCER CENTER ONLY)
ALT: 12 U/L (ref 0–44)
AST: 25 U/L (ref 15–41)
Albumin: 4.7 g/dL (ref 3.5–5.0)
Alkaline Phosphatase: 54 U/L (ref 38–126)
Anion gap: 9 (ref 5–15)
BUN: 20 mg/dL (ref 8–23)
CO2: 27 mmol/L (ref 22–32)
Calcium: 10.4 mg/dL — ABNORMAL HIGH (ref 8.9–10.3)
Chloride: 104 mmol/L (ref 98–111)
Creatinine: 1.1 mg/dL — ABNORMAL HIGH (ref 0.44–1.00)
GFR, Estimated: 51 mL/min — ABNORMAL LOW
Glucose, Bld: 103 mg/dL — ABNORMAL HIGH (ref 70–99)
Potassium: 5 mmol/L (ref 3.5–5.1)
Sodium: 140 mmol/L (ref 135–145)
Total Bilirubin: 1 mg/dL (ref 0.0–1.2)
Total Protein: 7.7 g/dL (ref 6.5–8.1)

## 2024-04-18 LAB — IRON AND IRON BINDING CAPACITY (CC-WL,HP ONLY)
Iron: 100 ug/dL (ref 28–170)
Saturation Ratios: 31 % (ref 10.4–31.8)
TIBC: 321 ug/dL (ref 250–450)
UIBC: 221 ug/dL

## 2024-04-18 LAB — CBC WITH DIFFERENTIAL (CANCER CENTER ONLY)
Abs Immature Granulocytes: 0.01 10*3/uL (ref 0.00–0.07)
Basophils Absolute: 0 10*3/uL (ref 0.0–0.1)
Basophils Relative: 1 %
Eosinophils Absolute: 0.2 10*3/uL (ref 0.0–0.5)
Eosinophils Relative: 4 %
HCT: 35 % — ABNORMAL LOW (ref 36.0–46.0)
Hemoglobin: 11.4 g/dL — ABNORMAL LOW (ref 12.0–15.0)
Immature Granulocytes: 0 %
Lymphocytes Relative: 40 %
Lymphs Abs: 1.8 10*3/uL (ref 0.7–4.0)
MCH: 31.4 pg (ref 26.0–34.0)
MCHC: 32.6 g/dL (ref 30.0–36.0)
MCV: 96.4 fL (ref 80.0–100.0)
Monocytes Absolute: 0.5 10*3/uL (ref 0.1–1.0)
Monocytes Relative: 12 %
Neutro Abs: 1.9 10*3/uL (ref 1.7–7.7)
Neutrophils Relative %: 43 %
Platelet Count: 136 10*3/uL — ABNORMAL LOW (ref 150–400)
RBC: 3.63 MIL/uL — ABNORMAL LOW (ref 3.87–5.11)
RDW: 12.3 % (ref 11.5–15.5)
WBC Count: 4.4 10*3/uL (ref 4.0–10.5)
nRBC: 0 % (ref 0.0–0.2)

## 2024-04-18 LAB — RETICULOCYTES
Immature Retic Fract: 6.9 % (ref 2.3–15.9)
RBC.: 3.58 MIL/uL — ABNORMAL LOW (ref 3.87–5.11)
Retic Count, Absolute: 38.1 10*3/uL (ref 19.0–186.0)
Retic Ct Pct: 1.1 % (ref 0.4–3.1)

## 2024-04-18 LAB — FERRITIN: Ferritin: 379 ng/mL — ABNORMAL HIGH (ref 11–307)

## 2024-04-18 NOTE — Progress Notes (Signed)
 " Hematology and Oncology Follow Up Visit  Rachael Davenport [id number] 12-19-1945 79 y.o. 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   She is doing well and has no concerns at this time.  Chronic fatigue is mild and stable No issue with blood loss. No bruising or petechiae.  No fever, chills, n/v, cough, rash, dizziness, SOB, chest pain, palpitations, abdominal pain or changes in bowel or bladder habits.  No numbness or tingling in her extremities.  No falls or syncope reported.  Appetite and hydration are fair.  Wt Readings from Last 3 Encounters:  04/18/24 106 lb 6.4 oz (48.3 kg)  02/26/24 104 lb 0.6 oz (47.2 kg)  01/15/24 110 lb 0.6 oz (49.9 kg)     ECOG Performance Status: 1 - Symptomatic but completely ambulatory  Medications:  Allergies as of 04/18/2024       Reactions   Penicillins Hives, Itching, Swelling   Has patient had a PCN reaction causing immediate rash, facial/tongue/throat swelling, SOB or lightheadedness with hypotension: Yes Has patient had a PCN reaction causing severe rash involving mucus membranes or skin necrosis: No Has patient had a PCN reaction that required hospitalization: No Has patient had a PCN reaction occurring within the last 10 years: No If all of the above answers are NO, then may proceed with Cephalosporin use.   Valsartan Itching, Other (See Comments)   Heart sped up   Amlodipine Itching   Patient can take lower dose Norvasc    Cephalexin Diarrhea        Medication List        Accurate as of April 18, 2024  4:02 PM. If you have any  questions, ask your nurse or doctor.          amLODipine 2.5 MG tablet Commonly known as: NORVASC Take 2.5 mg by mouth daily.   aspirin EC 81 MG tablet Take 81 mg by mouth daily. Swallow whole.   carvedilol 12.5 MG tablet Commonly known as: COREG Take 12.5 mg by mouth 2 (two) times daily with a meal.   cyanocobalamin  1000 MCG tablet Commonly known as: VITAMIN B12 Take 1,000 mcg by mouth daily.   Estradiol 4 MCG Inst Imvexxy Maintenance Pack 4 mcg vaginal insert  Insert 1 vaginal insert twice a week by vaginal route.   furosemide  20 MG tablet Commonly known as: LASIX  Take by mouth.   polyethylene glycol 17 g packet Commonly known as: MIRALAX / GLYCOLAX Take 17 g by mouth daily as needed.   pravastatin 10 MG tablet Commonly known as: PRAVACHOL pravastatin 10 mg tablet   Restasis 0.05 % ophthalmic emulsion Generic drug: cycloSPORINE INT 1 GTT IN OU BID UTD   SUMAtriptan 25 MG tablet Commonly known as: IMITREX Take 25 mg by mouth once as needed for migraine (MAY REPEAT ONCE IN 2 HOURS IF NO RELIEF (MAX 2 TABLETS/24 HOURS)).   Vitamin D (Ergocalciferol) 1.25 MG (50000 UNIT) Caps capsule Commonly known as: DRISDOL Take 50,000 Units by mouth every 7 (seven) days.        Allergies:  Allergies  Allergen  Reactions   Penicillins Hives, Itching and Swelling    Has patient had a PCN reaction causing immediate rash, facial/tongue/throat swelling, SOB or lightheadedness with hypotension: Yes Has patient had a PCN reaction causing severe rash involving mucus membranes or skin necrosis: No Has patient had a PCN reaction that required hospitalization: No Has patient had a PCN reaction occurring within the last 10 years: No If all of the above answers are NO, then may proceed with Cephalosporin use.    Valsartan Itching and Other (See Comments)    Heart sped up   Amlodipine Itching    Patient can take lower dose Norvasc    Cephalexin Diarrhea    Past Medical  History, Surgical history, Social history, and Family History were reviewed and updated.  Review of Systems: All other 10 point review of systems is negative.   Physical Exam:  height is 5' 1 (1.549 m) and weight is 106 lb 6.4 oz (48.3 kg). Her oral temperature is 97.8 F (36.6 C). Her blood pressure is 129/71 and her pulse is 69. Her respiration is 17 and oxygen saturation is 99%.   Wt Readings from Last 3 Encounters:  04/18/24 106 lb 6.4 oz (48.3 kg)  02/26/24 104 lb 0.6 oz (47.2 kg)  01/15/24 110 lb 0.6 oz (49.9 kg)    Ocular: Sclerae unicteric, pupils equal, round and reactive to light Ear-nose-throat: Oropharynx clear, dentition fair Lymphatic: No cervical or supraclavicular adenopathy Lungs no rales or rhonchi, good excursion bilaterally Heart regular rate and rhythm, no murmur appreciated Abd soft, nontender, positive bowel sounds MSK no focal spinal tenderness, no joint edema Neuro: non-focal, well-oriented, appropriate affect   Lab Results  Component Value Date   WBC 4.4 04/18/2024   HGB 11.4 (L) 04/18/2024   HCT 35.0 (L) 04/18/2024   MCV 96.4 04/18/2024   PLT 136 (L) 04/18/2024   Lab Results  Component Value Date   FERRITIN 442 (H) 02/26/2024   IRON  72 02/26/2024   TIBC 314 02/26/2024   UIBC 242 02/26/2024   IRONPCTSAT 23 02/26/2024   Lab Results  Component Value Date   RETICCTPCT 1.1 04/18/2024   RBC 3.58 (L) 04/18/2024   No results found for: KPAFRELGTCHN, LAMBDASER, KAPLAMBRATIO No results found for: IGGSERUM, IGA, IGMSERUM No results found for: Rachael Davenport Rachael Davenport, MSPIKE, SPEI   Chemistry      Component Value Date/Time   NA 140 04/18/2024 1502   NA 145 02/13/2017 1046   NA 142 12/23/2016 0951   K 5.0 04/18/2024 1502   K 4.6 02/13/2017 1046   K 3.9 12/23/2016 0951   CL 104 04/18/2024 1502   CL 108 02/13/2017 1046   CO2 27 04/18/2024 1502   CO2 28 02/13/2017 1046   CO2 25  12/23/2016 0951   BUN 20 04/18/2024 1502   BUN 22 02/13/2017 1046   BUN 15.0 12/23/2016 0951   CREATININE 1.10 (H) 04/18/2024 1502   CREATININE 1.1 02/13/2017 1046   CREATININE 0.9 12/23/2016 0951      Component Value Date/Time   CALCIUM 10.4 (H) 04/18/2024 1502   CALCIUM 9.7 02/13/2017 1046   CALCIUM 9.5 12/23/2016 0951   ALKPHOS 54 04/18/2024 1502   ALKPHOS 60 02/13/2017 1046   ALKPHOS 69 12/23/2016 0951   AST 25 04/18/2024 1502   AST 26 12/23/2016 0951   ALT 12 04/18/2024 1502   ALT 18 02/13/2017 1046   ALT 16 12/23/2016 0951   BILITOT 1.0 04/18/2024 1502   BILITOT  0.96 12/23/2016 9048     Encounter Diagnoses  Name Primary?   Erythropoietin  deficiency anemia Yes   Iron  deficiency anemia due to chronic blood loss    Anemia associated with chronic renal failure    Nutritional deficiency    Thrombocytopenia     Impression and Plan: Ms. Jersey is a very pleasant 79 yo African American female with anemia of chronic renal insufficiency and erythropoietin  deficiency as well as B 12 and iron  deficiency anemias.   ESA held this visit, Hgb 11.4 Iron  studies pending. We will replace if needed. Will keep an eye on her ferritin as well as this has trended up a bit. Suspected to be secondary to her chronic medical conditions and inflammation. Given her mild thrombocytopenia we should consider liver imaging if this trends upwards.   RTC 6 weeks APP, labs (CBC w/, CMP, folate, iron , ferritin, retic), injection   Lauraine CHRISTELLA Dais, PA-C 1/29/20264:02 PM  "

## 2024-05-30 ENCOUNTER — Inpatient Hospital Stay: Admitting: Medical Oncology

## 2024-05-30 ENCOUNTER — Inpatient Hospital Stay
